# Patient Record
Sex: Male | Born: 1945
Health system: Southern US, Community
[De-identification: ages and names within clinical notes are randomized; demographics above are authoritative.]

## PROBLEM LIST (undated history)

## (undated) ENCOUNTER — Emergency Department (HOSPITAL_COMMUNITY): Admission: EM | Payer: Medicare HMO | Source: Home / Self Care

## (undated) DIAGNOSIS — J449 Chronic obstructive pulmonary disease, unspecified: Secondary | ICD-10-CM

## (undated) DIAGNOSIS — C61 Malignant neoplasm of prostate: Secondary | ICD-10-CM

## (undated) HISTORY — PX: TONSILLECTOMY: SUR1361

---

## 2006-03-29 ENCOUNTER — Emergency Department (HOSPITAL_COMMUNITY): Admission: EM | Admit: 2006-03-29 | Discharge: 2006-03-29 | Payer: Self-pay | Admitting: Emergency Medicine

## 2009-12-28 HISTORY — PX: RADIOACTIVE SEED IMPLANT: SHX5150

## 2010-02-26 ENCOUNTER — Ambulatory Visit: Admission: RE | Admit: 2010-02-26 | Discharge: 2010-05-09 | Payer: Self-pay | Admitting: Radiation Oncology

## 2010-03-27 ENCOUNTER — Encounter: Admission: RE | Admit: 2010-03-27 | Discharge: 2010-03-27 | Payer: Self-pay | Admitting: Urology

## 2010-06-04 ENCOUNTER — Ambulatory Visit (HOSPITAL_BASED_OUTPATIENT_CLINIC_OR_DEPARTMENT_OTHER): Admission: RE | Admit: 2010-06-04 | Discharge: 2010-06-04 | Payer: Self-pay | Admitting: Urology

## 2010-06-19 ENCOUNTER — Ambulatory Visit: Admission: RE | Admit: 2010-06-19 | Discharge: 2010-07-11 | Payer: Self-pay | Admitting: Radiation Oncology

## 2011-03-16 LAB — COMPREHENSIVE METABOLIC PANEL
ALT: 37 U/L (ref 0–53)
AST: 28 U/L (ref 0–37)
Albumin: 4.6 g/dL (ref 3.5–5.2)
Alkaline Phosphatase: 56 U/L (ref 39–117)
BUN: 14 mg/dL (ref 6–23)
CO2: 28 mEq/L (ref 19–32)
Calcium: 9.4 mg/dL (ref 8.4–10.5)
Chloride: 103 mEq/L (ref 96–112)
Creatinine, Ser: 1.07 mg/dL (ref 0.4–1.5)
GFR calc Af Amer: >60 mL/min (ref >60)
GFR calc non Af Amer: >60 mL/min (ref >60)
Glucose, Bld: 133 mg/dL — ABNORMAL HIGH (ref 70–99)
Potassium: 4.2 mEq/L (ref 3.5–5.1)
Sodium: 138 mEq/L (ref 135–145)
Total Bilirubin: 0.7 mg/dL (ref 0.3–1.2)
Total Protein: 7.3 g/dL (ref 6.0–8.3)

## 2011-03-16 LAB — CBC
HCT: 41.9 % (ref 39.0–52.0)
Hemoglobin: 14.2 g/dL (ref 13.0–17.0)
MCHC: 33.9 g/dL (ref 30.0–36.0)
MCV: 91.1 fL (ref 78.0–100.0)
Platelets: 324 10*3/uL (ref 150–400)
RBC: 4.59 MIL/uL (ref 4.22–5.81)
RDW: 13.9 % (ref 11.5–15.5)
WBC: 6.9 10*3/uL (ref 4.0–10.5)

## 2011-03-16 LAB — PROTIME-INR
INR: 1.02 (ref 0.00–1.49)
Prothrombin Time: 13.3 seconds (ref 11.6–15.2)

## 2011-03-16 LAB — APTT: aPTT: 35 seconds (ref 24–37)

## 2015-08-27 ENCOUNTER — Other Ambulatory Visit (HOSPITAL_COMMUNITY): Payer: Self-pay | Admitting: Urology

## 2015-08-27 DIAGNOSIS — C61 Malignant neoplasm of prostate: Secondary | ICD-10-CM

## 2015-09-11 ENCOUNTER — Encounter (HOSPITAL_COMMUNITY)
Admission: RE | Admit: 2015-09-11 | Discharge: 2015-09-11 | Disposition: A | Discharge status: Home / Self Care | Payer: Medicare Other | Source: Ambulatory Visit | Attending: Urology | Admitting: Urology

## 2015-09-11 DIAGNOSIS — C61 Malignant neoplasm of prostate: Secondary | ICD-10-CM | POA: Insufficient documentation

## 2015-09-11 MED ORDER — TECHNETIUM TC 99M MEDRONATE IV KIT
27.4000 | PACK | Freq: Once | INTRAVENOUS | Status: AC | PRN
  Administered 2015-09-11: 27.4 via INTRAVENOUS

## 2016-02-10 DIAGNOSIS — Z961 Presence of intraocular lens: Secondary | ICD-10-CM | POA: Diagnosis not present

## 2016-02-10 DIAGNOSIS — H40013 Open angle with borderline findings, low risk, bilateral: Secondary | ICD-10-CM | POA: Diagnosis not present

## 2016-02-10 DIAGNOSIS — H10013 Acute follicular conjunctivitis, bilateral: Secondary | ICD-10-CM | POA: Diagnosis not present

## 2016-02-10 DIAGNOSIS — D3131 Benign neoplasm of right choroid: Secondary | ICD-10-CM | POA: Diagnosis not present

## 2016-03-06 DIAGNOSIS — L2084 Intrinsic (allergic) eczema: Secondary | ICD-10-CM | POA: Diagnosis not present

## 2016-03-06 DIAGNOSIS — Z0000 Encounter for general adult medical examination without abnormal findings: Secondary | ICD-10-CM | POA: Diagnosis not present

## 2016-03-06 DIAGNOSIS — J4530 Mild persistent asthma, uncomplicated: Secondary | ICD-10-CM | POA: Diagnosis not present

## 2016-03-06 DIAGNOSIS — C61 Malignant neoplasm of prostate: Secondary | ICD-10-CM | POA: Diagnosis not present

## 2016-03-06 DIAGNOSIS — Z131 Encounter for screening for diabetes mellitus: Secondary | ICD-10-CM | POA: Diagnosis not present

## 2016-03-06 DIAGNOSIS — E7800 Pure hypercholesterolemia, unspecified: Secondary | ICD-10-CM | POA: Diagnosis not present

## 2016-03-09 DIAGNOSIS — R69 Illness, unspecified: Secondary | ICD-10-CM | POA: Diagnosis not present

## 2016-03-20 DIAGNOSIS — C61 Malignant neoplasm of prostate: Secondary | ICD-10-CM | POA: Diagnosis not present

## 2016-03-27 DIAGNOSIS — C61 Malignant neoplasm of prostate: Secondary | ICD-10-CM | POA: Diagnosis not present

## 2016-03-27 DIAGNOSIS — Z0000 Encounter for general adult medical examination without abnormal findings: Secondary | ICD-10-CM | POA: Diagnosis not present

## 2016-06-10 ENCOUNTER — Encounter (HOSPITAL_BASED_OUTPATIENT_CLINIC_OR_DEPARTMENT_OTHER): Payer: Self-pay

## 2016-06-10 ENCOUNTER — Emergency Department (HOSPITAL_BASED_OUTPATIENT_CLINIC_OR_DEPARTMENT_OTHER)
Admission: EM | Admit: 2016-06-10 | Discharge: 2016-06-10 | Disposition: A | Discharge status: Home / Self Care | Payer: Medicare HMO | Attending: Emergency Medicine | Admitting: Emergency Medicine

## 2016-06-10 DIAGNOSIS — L03115 Cellulitis of right lower limb: Secondary | ICD-10-CM | POA: Diagnosis not present

## 2016-06-10 DIAGNOSIS — Z79899 Other long term (current) drug therapy: Secondary | ICD-10-CM | POA: Diagnosis not present

## 2016-06-10 DIAGNOSIS — J449 Chronic obstructive pulmonary disease, unspecified: Secondary | ICD-10-CM | POA: Diagnosis not present

## 2016-06-10 DIAGNOSIS — R2241 Localized swelling, mass and lump, right lower limb: Secondary | ICD-10-CM | POA: Diagnosis present

## 2016-06-10 DIAGNOSIS — R062 Wheezing: Secondary | ICD-10-CM | POA: Diagnosis not present

## 2016-06-10 DIAGNOSIS — Z8546 Personal history of malignant neoplasm of prostate: Secondary | ICD-10-CM | POA: Diagnosis not present

## 2016-06-10 HISTORY — DX: Malignant neoplasm of prostate: C61

## 2016-06-10 HISTORY — DX: Chronic obstructive pulmonary disease, unspecified: J44.9

## 2016-06-10 MED ORDER — CEPHALEXIN 500 MG PO CAPS: 1000.0000 mg | ORAL_CAPSULE | Freq: Two times a day (BID) | ORAL | Status: DC

## 2016-06-10 MED ORDER — SULFAMETHOXAZOLE-TRIMETHOPRIM 800-160 MG PO TABS
1.0000 | ORAL_TABLET | Freq: Once | ORAL | Status: AC
  Administered 2016-06-10: 1 via ORAL
  Filled 2016-06-10: qty 1

## 2016-06-10 MED ORDER — CEPHALEXIN 250 MG PO CAPS
1000.0000 mg | ORAL_CAPSULE | Freq: Once | ORAL | Status: AC
  Administered 2016-06-10: 1000 mg via ORAL
  Filled 2016-06-10: qty 4

## 2016-06-10 MED ORDER — SULFAMETHOXAZOLE-TRIMETHOPRIM 800-160 MG PO TABS: 1.0000 | ORAL_TABLET | Freq: Two times a day (BID) | ORAL | Status: AC

## 2016-06-10 MED FILL — CEPHALEXIN 500 MG CAPSULE: 500 | 7 days supply | Qty: 28 | Fill #0

## 2016-06-10 MED FILL — SULFAMETHOXAZOLE-TMP DS TAB: 800-160 | 7 days supply | Qty: 14 | Fill #0

## 2016-06-10 NOTE — Discharge Instructions (Signed)

## 2016-06-10 NOTE — ED Notes (Signed)
Pt sent from PCP-c/o "lump" to right thigh-first noticed last night and has increased in size-NAD-steady gait

## 2016-06-10 NOTE — ED Provider Notes (Signed)
CSN: [id number]     Arrival date & time 06/10/16  1633 History   First MD Initiated Contact with Patient 06/10/16 1708     Chief Complaint  Patient presents with  . Mass     (Consider location/radiation/quality/duration/timing/severity/associated sxs/prior Treatment) HPI Patient has small lump to the right medial thigh yesterday evening. He reports that it has grown in size significantly over the course of today with increasing redness. He does not know if there was an insect bite. He reports he does walk in the woods. He does however report he checks for ticks and none was present. He reports that his mildly uncomfortable but not severely painful. It does not interfere with his gait. No fevers or chills. No nausea or general illness. Patient was seen by his family doctor today and advised to come to the emergency department for further assessment. Past Medical History  Diagnosis Date  . Prostate cancer ([place])   . COPD (chronic obstructive pulmonary disease) ([place])    History reviewed. No pertinent past surgical history. No family history on file. Social History  Substance Use Topics  . Smoking status: Never Smoker   . Smokeless tobacco: None  . Alcohol Use: Yes     Comment: daily    Review of Systems  10 Systems reviewed and are negative for acute change except as noted in the HPI.   Allergies  Review of patient's allergies indicates no known allergies.  Home Medications   Prior to Admission medications   Medication Sig Start Date End Date Taking? Authorizing Provider  Budesonide-Formoterol Fumarate (SYMBICORT IN) Inhale into the lungs.   Yes Historical Provider, MD  cephALEXin (KEFLEX) 500 MG capsule Take 2 capsules (1,000 mg total) by mouth 2 (two) times daily. 06/10/16   Charlesetta Shanks, MD  sulfamethoxazole-trimethoprim (BACTRIM DS,SEPTRA DS) 800-160 MG tablet Take 1 tablet by mouth 2 (two) times daily. 06/10/16 06/17/16  Charlesetta Shanks, MD   BP 156/88 mmHg  Pulse 92   Temp(Src) 99.2 F (37.3 C) (Oral)  Resp 18  Ht 6' (1.829 m)  Wt 198 lb (89.812 kg)  BMI 26.85 kg/m2  SpO2 96% Physical Exam  Constitutional: He is oriented to person, place, and time. He appears well-developed and well-nourished. No distress.  HENT:  Head: Normocephalic and atraumatic.  Eyes: EOM are normal.  Neck: Neck supple.  Cardiovascular: Normal rate, regular rhythm, normal heart sounds and intact distal pulses.   Pulmonary/Chest: Effort normal. He has wheezes.  Occasional expiratory wheeze but adequate air flow to the bases.  Abdominal: Soft. Bowel sounds are normal. He exhibits no distension. There is no tenderness.  Musculoskeletal: Normal range of motion. He exhibits edema.  Right upper, medial thigh has a plaque of erythema that is 18 x 22 cm with the 22 cm being in the long axis. Erythema is blanching. To the superior aspect of this lesion there is a 9 x 6 area of induration within the erythema. There is no fluctuance present. This is mildly tender. No streaking into the groin. No groin lymphadenopathy. No knee effusion no lower leg edema.  Ultrasound was used to aid in examination. Area of induration showed cellulitis but no focal abscess.  Neurological: He is alert and oriented to person, place, and time. He has normal strength. Coordination normal. GCS eye subscore is 4. GCS verbal subscore is 5. GCS motor subscore is 6.  Skin: Skin is warm, dry and intact.  Psychiatric: He has a normal mood and affect.    ED Course  Procedures (including critical care time) Labs Review Labs Reviewed - No data to display  Imaging Review No results found. I have personally reviewed and evaluated these images and lab results as part of my medical decision-making.   EKG Interpretation None      MDM   Final diagnoses:  Cellulitis of right lower extremity   Patient has no constitutional symptoms. He presents with an area of erythema and central induration. He has mild associated  discomfort but not significant pain. At this time he'll be treated for cellulitis with no focal abscess. Consideration is also for possible insect envenomation with inflammatory response, there is however no conclusive history of insect bite. Other consideration is for thrombophlebitis. There is however minimal tenderness and erythema and induration is in a broader distribution that I would anticipate with thrombophlebitis. There is no focal palpable cord. As I was assessing for abscess with ultrasound, all vessels beneath the area were very compressible. At this time with the patient being well we will initiate outpatient antibiotics with close follow-up. He is counseled on signs and symptoms for which return. Area has been outlined with skin marker.    Charlesetta Shanks, MD 06/10/16 [phone]

## 2016-06-12 DIAGNOSIS — L0291 Cutaneous abscess, unspecified: Secondary | ICD-10-CM | POA: Diagnosis not present

## 2016-06-15 DIAGNOSIS — L02415 Cutaneous abscess of right lower limb: Secondary | ICD-10-CM | POA: Diagnosis not present

## 2016-07-27 DIAGNOSIS — C61 Malignant neoplasm of prostate: Secondary | ICD-10-CM | POA: Diagnosis not present

## 2016-08-03 DIAGNOSIS — C61 Malignant neoplasm of prostate: Secondary | ICD-10-CM | POA: Diagnosis not present

## 2016-08-11 DIAGNOSIS — H10013 Acute follicular conjunctivitis, bilateral: Secondary | ICD-10-CM | POA: Diagnosis not present

## 2016-08-11 DIAGNOSIS — H1013 Acute atopic conjunctivitis, bilateral: Secondary | ICD-10-CM | POA: Diagnosis not present

## 2016-08-11 DIAGNOSIS — H40013 Open angle with borderline findings, low risk, bilateral: Secondary | ICD-10-CM | POA: Diagnosis not present

## 2016-10-28 DIAGNOSIS — R69 Illness, unspecified: Secondary | ICD-10-CM | POA: Diagnosis not present

## 2017-01-27 DIAGNOSIS — C61 Malignant neoplasm of prostate: Secondary | ICD-10-CM | POA: Diagnosis not present

## 2017-02-03 DIAGNOSIS — R972 Elevated prostate specific antigen [PSA]: Secondary | ICD-10-CM | POA: Diagnosis not present

## 2017-02-12 DIAGNOSIS — L2089 Other atopic dermatitis: Secondary | ICD-10-CM | POA: Diagnosis not present

## 2017-02-12 DIAGNOSIS — L918 Other hypertrophic disorders of the skin: Secondary | ICD-10-CM | POA: Diagnosis not present

## 2017-02-22 DIAGNOSIS — H35033 Hypertensive retinopathy, bilateral: Secondary | ICD-10-CM | POA: Diagnosis not present

## 2017-02-22 DIAGNOSIS — H40013 Open angle with borderline findings, low risk, bilateral: Secondary | ICD-10-CM | POA: Diagnosis not present

## 2017-02-22 DIAGNOSIS — H35359 Cystoid macular degeneration, unspecified eye: Secondary | ICD-10-CM | POA: Diagnosis not present

## 2017-02-22 DIAGNOSIS — Z961 Presence of intraocular lens: Secondary | ICD-10-CM | POA: Diagnosis not present

## 2017-03-04 ENCOUNTER — Encounter (INDEPENDENT_AMBULATORY_CARE_PROVIDER_SITE_OTHER): Payer: Medicare HMO | Admitting: Ophthalmology

## 2017-03-04 DIAGNOSIS — D3132 Benign neoplasm of left choroid: Secondary | ICD-10-CM | POA: Diagnosis not present

## 2017-03-04 DIAGNOSIS — H43813 Vitreous degeneration, bilateral: Secondary | ICD-10-CM | POA: Diagnosis not present

## 2017-03-04 DIAGNOSIS — H59032 Cystoid macular edema following cataract surgery, left eye: Secondary | ICD-10-CM

## 2017-03-04 DIAGNOSIS — H35372 Puckering of macula, left eye: Secondary | ICD-10-CM

## 2017-03-09 DIAGNOSIS — Z131 Encounter for screening for diabetes mellitus: Secondary | ICD-10-CM | POA: Diagnosis not present

## 2017-03-09 DIAGNOSIS — R634 Abnormal weight loss: Secondary | ICD-10-CM | POA: Diagnosis not present

## 2017-03-09 DIAGNOSIS — Z0000 Encounter for general adult medical examination without abnormal findings: Secondary | ICD-10-CM | POA: Diagnosis not present

## 2017-03-09 DIAGNOSIS — E7800 Pure hypercholesterolemia, unspecified: Secondary | ICD-10-CM | POA: Diagnosis not present

## 2017-03-16 DIAGNOSIS — R69 Illness, unspecified: Secondary | ICD-10-CM | POA: Diagnosis not present

## 2017-04-15 ENCOUNTER — Encounter (INDEPENDENT_AMBULATORY_CARE_PROVIDER_SITE_OTHER): Payer: Medicare HMO | Admitting: Ophthalmology

## 2017-04-15 DIAGNOSIS — D3132 Benign neoplasm of left choroid: Secondary | ICD-10-CM

## 2017-04-15 DIAGNOSIS — H59032 Cystoid macular edema following cataract surgery, left eye: Secondary | ICD-10-CM | POA: Diagnosis not present

## 2017-04-15 DIAGNOSIS — H43813 Vitreous degeneration, bilateral: Secondary | ICD-10-CM | POA: Diagnosis not present

## 2017-06-15 ENCOUNTER — Encounter (INDEPENDENT_AMBULATORY_CARE_PROVIDER_SITE_OTHER): Payer: Medicare HMO | Admitting: Ophthalmology

## 2017-06-15 DIAGNOSIS — D3132 Benign neoplasm of left choroid: Secondary | ICD-10-CM | POA: Diagnosis not present

## 2017-06-15 DIAGNOSIS — H43813 Vitreous degeneration, bilateral: Secondary | ICD-10-CM | POA: Diagnosis not present

## 2017-06-15 DIAGNOSIS — H59032 Cystoid macular edema following cataract surgery, left eye: Secondary | ICD-10-CM

## 2017-08-04 DIAGNOSIS — C61 Malignant neoplasm of prostate: Secondary | ICD-10-CM | POA: Diagnosis not present

## 2017-08-25 DIAGNOSIS — C61 Malignant neoplasm of prostate: Secondary | ICD-10-CM | POA: Diagnosis not present

## 2017-08-26 ENCOUNTER — Other Ambulatory Visit (HOSPITAL_COMMUNITY): Payer: Self-pay | Admitting: Urology

## 2017-08-26 DIAGNOSIS — C61 Malignant neoplasm of prostate: Secondary | ICD-10-CM

## 2017-09-07 ENCOUNTER — Encounter (HOSPITAL_COMMUNITY)
Admission: RE | Admit: 2017-09-07 | Discharge: 2017-09-07 | Disposition: A | Discharge status: Home / Self Care | Payer: Medicare HMO | Source: Ambulatory Visit | Attending: Urology | Admitting: Urology

## 2017-09-07 DIAGNOSIS — C61 Malignant neoplasm of prostate: Secondary | ICD-10-CM | POA: Insufficient documentation

## 2017-09-07 MED ORDER — TECHNETIUM TC 99M MEDRONATE IV KIT
20.2000 | PACK | Freq: Once | INTRAVENOUS | Status: AC | PRN
  Administered 2017-09-07: 20.2 via INTRAVENOUS

## 2017-09-15 ENCOUNTER — Encounter (INDEPENDENT_AMBULATORY_CARE_PROVIDER_SITE_OTHER): Payer: Medicare HMO | Admitting: Ophthalmology

## 2017-09-15 DIAGNOSIS — H43813 Vitreous degeneration, bilateral: Secondary | ICD-10-CM

## 2017-09-15 DIAGNOSIS — D3132 Benign neoplasm of left choroid: Secondary | ICD-10-CM | POA: Diagnosis not present

## 2017-09-15 DIAGNOSIS — H59032 Cystoid macular edema following cataract surgery, left eye: Secondary | ICD-10-CM | POA: Diagnosis not present

## 2017-09-24 DIAGNOSIS — L400 Psoriasis vulgaris: Secondary | ICD-10-CM | POA: Diagnosis not present

## 2017-09-28 DIAGNOSIS — R69 Illness, unspecified: Secondary | ICD-10-CM | POA: Diagnosis not present

## 2017-10-28 DIAGNOSIS — R69 Illness, unspecified: Secondary | ICD-10-CM | POA: Diagnosis not present

## 2018-02-09 DIAGNOSIS — R69 Illness, unspecified: Secondary | ICD-10-CM | POA: Diagnosis not present

## 2018-03-16 DIAGNOSIS — R69 Illness, unspecified: Secondary | ICD-10-CM | POA: Diagnosis not present

## 2018-03-31 DIAGNOSIS — Z131 Encounter for screening for diabetes mellitus: Secondary | ICD-10-CM | POA: Diagnosis not present

## 2018-03-31 DIAGNOSIS — C61 Malignant neoplasm of prostate: Secondary | ICD-10-CM | POA: Diagnosis not present

## 2018-03-31 DIAGNOSIS — J4530 Mild persistent asthma, uncomplicated: Secondary | ICD-10-CM | POA: Diagnosis not present

## 2018-03-31 DIAGNOSIS — Z1389 Encounter for screening for other disorder: Secondary | ICD-10-CM | POA: Diagnosis not present

## 2018-03-31 DIAGNOSIS — S81812A Laceration without foreign body, left lower leg, initial encounter: Secondary | ICD-10-CM | POA: Diagnosis not present

## 2018-03-31 DIAGNOSIS — J309 Allergic rhinitis, unspecified: Secondary | ICD-10-CM | POA: Diagnosis not present

## 2018-03-31 DIAGNOSIS — Z0000 Encounter for general adult medical examination without abnormal findings: Secondary | ICD-10-CM | POA: Diagnosis not present

## 2018-03-31 DIAGNOSIS — E7800 Pure hypercholesterolemia, unspecified: Secondary | ICD-10-CM | POA: Diagnosis not present

## 2018-05-10 DIAGNOSIS — R69 Illness, unspecified: Secondary | ICD-10-CM | POA: Diagnosis not present

## 2018-08-11 DIAGNOSIS — R69 Illness, unspecified: Secondary | ICD-10-CM | POA: Diagnosis not present

## 2018-09-15 ENCOUNTER — Encounter (INDEPENDENT_AMBULATORY_CARE_PROVIDER_SITE_OTHER): Payer: Medicare HMO | Admitting: Ophthalmology

## 2018-09-15 DIAGNOSIS — D3132 Benign neoplasm of left choroid: Secondary | ICD-10-CM | POA: Diagnosis not present

## 2018-09-15 DIAGNOSIS — H43813 Vitreous degeneration, bilateral: Secondary | ICD-10-CM

## 2018-09-25 DIAGNOSIS — R69 Illness, unspecified: Secondary | ICD-10-CM | POA: Diagnosis not present

## 2018-09-27 DIAGNOSIS — L2089 Other atopic dermatitis: Secondary | ICD-10-CM | POA: Diagnosis not present

## 2018-09-27 DIAGNOSIS — L821 Other seborrheic keratosis: Secondary | ICD-10-CM | POA: Diagnosis not present

## 2018-09-30 ENCOUNTER — Other Ambulatory Visit: Payer: Self-pay | Admitting: Urology

## 2018-09-30 DIAGNOSIS — C61 Malignant neoplasm of prostate: Secondary | ICD-10-CM

## 2018-10-11 DIAGNOSIS — C61 Malignant neoplasm of prostate: Secondary | ICD-10-CM | POA: Diagnosis not present

## 2018-10-12 DIAGNOSIS — C61 Malignant neoplasm of prostate: Secondary | ICD-10-CM | POA: Diagnosis not present

## 2018-10-12 DIAGNOSIS — R9721 Rising PSA following treatment for malignant neoplasm of prostate: Secondary | ICD-10-CM | POA: Diagnosis not present

## 2018-10-12 DIAGNOSIS — R3121 Asymptomatic microscopic hematuria: Secondary | ICD-10-CM | POA: Diagnosis not present

## 2018-10-17 ENCOUNTER — Encounter (HOSPITAL_COMMUNITY)
Admission: RE | Admit: 2018-10-17 | Discharge: 2018-10-17 | Disposition: A | Discharge status: Home / Self Care | Payer: Medicare HMO | Source: Ambulatory Visit | Attending: Urology | Admitting: Urology

## 2018-10-17 ENCOUNTER — Ambulatory Visit (HOSPITAL_COMMUNITY)
Admission: RE | Admit: 2018-10-17 | Discharge: 2018-10-17 | Disposition: A | Discharge status: Home / Self Care | Payer: Medicare HMO | Source: Ambulatory Visit | Attending: Urology | Admitting: Urology

## 2018-10-17 DIAGNOSIS — C61 Malignant neoplasm of prostate: Secondary | ICD-10-CM | POA: Insufficient documentation

## 2018-10-17 MED ORDER — FLUDEOXYGLUCOSE F - 18 (FDG) INJECTION
20.8000 | Freq: Once | INTRAVENOUS | Status: AC | PRN
  Administered 2018-10-17: 20.8 via INTRAVENOUS

## 2018-10-26 ENCOUNTER — Encounter (HOSPITAL_COMMUNITY): Payer: Medicare HMO

## 2018-10-26 ENCOUNTER — Other Ambulatory Visit (HOSPITAL_COMMUNITY): Payer: Medicare HMO

## 2018-11-09 DIAGNOSIS — C61 Malignant neoplasm of prostate: Secondary | ICD-10-CM | POA: Diagnosis not present

## 2018-11-09 DIAGNOSIS — R3121 Asymptomatic microscopic hematuria: Secondary | ICD-10-CM | POA: Diagnosis not present

## 2018-11-09 DIAGNOSIS — R972 Elevated prostate specific antigen [PSA]: Secondary | ICD-10-CM | POA: Diagnosis not present

## 2018-11-14 DIAGNOSIS — R69 Illness, unspecified: Secondary | ICD-10-CM | POA: Diagnosis not present

## 2018-11-21 DIAGNOSIS — C61 Malignant neoplasm of prostate: Secondary | ICD-10-CM | POA: Diagnosis not present

## 2018-11-21 DIAGNOSIS — R3121 Asymptomatic microscopic hematuria: Secondary | ICD-10-CM | POA: Diagnosis not present

## 2018-11-28 ENCOUNTER — Other Ambulatory Visit: Payer: Self-pay | Admitting: Urology

## 2018-12-09 NOTE — Patient Instructions (Addendum)
Jose Wolfe  12/09/2018   Your procedure is scheduled on: 12-15-18    Report to [facility] Main  Entrance    Report to Admitting at 5:30  AM    Call this number if you have problems the morning of surgery [phone]    Remember: Do not eat food or drink liquids :After Midnight.     BRUSH YOUR TEETH MORNING OF SURGERY AND RINSE YOUR MOUTH OUT, NO CHEWING GUM CANDY OR MINTS.     Take these medicines the morning of surgery with A SIP OF WATER: None. You may use and bring your inhaler .                                You may not have any metal on your body including hair pins and              piercings  Do not wear jewelry, lotions, powders, cologne or deodorant             Men may shave face and neck.   Do not bring valuables to the hospital. [name]  Contacts, dentures or bridgework may not be worn into surgery.     Patients discharged the day of surgery will not be allowed to drive home.  Name and phone number of your driver: Jose Wolfe [phone]  Special Instructions: Please bring a copy of your Advance Directive and/or Living Will              Please read over the following fact sheets you were given: _____________________________________________________________________             [name] - Preparing for Surgery Before surgery, you can play an important role.  Because skin is not sterile, your skin needs to be as free of germs as possible.  You can reduce the number of germs on your skin by washing with CHG (chlorahexidine gluconate) soap before surgery.  CHG is an antiseptic cleaner which kills germs and bonds with the skin to continue killing germs even after washing. Please DO NOT use if you have an allergy to CHG or antibacterial soaps.  If your skin becomes reddened/irritated stop using the CHG and inform your nurse when you arrive at Short Stay. Do not shave  (including legs and underarms) for at least 48 hours prior to the first CHG shower.  You may shave your face/neck. Please follow these instructions carefully:  1.  Shower with CHG Soap the night before surgery and the  morning of Surgery.  2.  If you choose to wash your hair, wash your hair first as usual with your  normal  shampoo.  3.  After you shampoo, rinse your hair and body thoroughly to remove the  shampoo.                           4.  Use CHG as you would any other liquid soap.  You can apply chg directly  to the skin and wash                       Gently with a scrungie or clean washcloth.  5.  Apply the CHG Soap to your body ONLY FROM THE NECK DOWN.   Do not use on face/ open                           Wound or open sores. Avoid contact with eyes, ears mouth and genitals (private parts).                       Wash face,  Genitals (private parts) with your normal soap.             6.  Wash thoroughly, paying special attention to the area where your surgery  will be performed.  7.  Thoroughly rinse your body with warm water from the neck down.  8.  DO NOT shower/wash with your normal soap after using and rinsing off  the CHG Soap.                9.  Pat yourself dry with a clean towel.            10.  Wear clean pajamas.            11.  Place clean sheets on your bed the night of your first shower and do not  sleep with pets. Day of Surgery : Do not apply any lotions/deodorants the morning of surgery.  Please wear clean clothes to the hospital/surgery center.  FAILURE TO FOLLOW THESE INSTRUCTIONS MAY RESULT IN THE CANCELLATION OF YOUR SURGERY PATIENT SIGNATURE_________________________________  NURSE SIGNATURE__________________________________  ________________________________________________________________________

## 2018-12-12 ENCOUNTER — Encounter (HOSPITAL_COMMUNITY): Payer: Self-pay

## 2018-12-12 ENCOUNTER — Other Ambulatory Visit: Payer: Self-pay

## 2018-12-12 ENCOUNTER — Encounter (HOSPITAL_COMMUNITY)
Admission: RE | Admit: 2018-12-12 | Discharge: 2018-12-12 | Disposition: A | Discharge status: Home / Self Care | Payer: Medicare HMO | Source: Ambulatory Visit | Attending: Urology | Admitting: Urology

## 2018-12-12 DIAGNOSIS — C675 Malignant neoplasm of bladder neck: Secondary | ICD-10-CM | POA: Diagnosis not present

## 2018-12-12 DIAGNOSIS — D494 Neoplasm of unspecified behavior of bladder: Secondary | ICD-10-CM | POA: Diagnosis present

## 2018-12-12 DIAGNOSIS — Z01812 Encounter for preprocedural laboratory examination: Secondary | ICD-10-CM | POA: Insufficient documentation

## 2018-12-12 DIAGNOSIS — Z7951 Long term (current) use of inhaled steroids: Secondary | ICD-10-CM | POA: Diagnosis not present

## 2018-12-12 DIAGNOSIS — Z923 Personal history of irradiation: Secondary | ICD-10-CM | POA: Diagnosis not present

## 2018-12-12 DIAGNOSIS — Z8546 Personal history of malignant neoplasm of prostate: Secondary | ICD-10-CM

## 2018-12-12 DIAGNOSIS — J449 Chronic obstructive pulmonary disease, unspecified: Secondary | ICD-10-CM | POA: Diagnosis not present

## 2018-12-12 LAB — CBC
HCT: 42.1 % (ref 39.0–52.0)
Hemoglobin: 13.3 g/dL (ref 13.0–17.0)
MCH: 30.1 pg (ref 26.0–34.0)
MCHC: 31.6 g/dL (ref 30.0–36.0)
MCV: 95.2 fL (ref 80.0–100.0)
Platelets: 289 10*3/uL (ref 150–400)
RBC: 4.42 MIL/uL (ref 4.22–5.81)
RDW: 13.2 % (ref 11.5–15.5)
WBC: 7.3 10*3/uL (ref 4.0–10.5)
nRBC: 0 % (ref 0.0–0.2)

## 2018-12-12 LAB — BASIC METABOLIC PANEL
Anion gap: 8 (ref 5–15)
BUN: 18 mg/dL (ref 8–23)
CO2: 29 mmol/L (ref 22–32)
Calcium: 8.8 mg/dL — ABNORMAL LOW (ref 8.9–10.3)
Chloride: 105 mmol/L (ref 98–111)
Creatinine, Ser: 1.01 mg/dL (ref 0.61–1.24)
GFR calc Af Amer: >60 mL/min (ref >60)
GFR calc non Af Amer: >60 mL/min (ref >60)
Glucose, Bld: 83 mg/dL (ref 70–99)
Potassium: 3.9 mmol/L (ref 3.5–5.1)
Sodium: 142 mmol/L (ref 135–145)

## 2018-12-14 NOTE — Anesthesia Preprocedure Evaluation (Addendum)
Anesthesia Evaluation  Patient identified by MRN, date of birth, ID band Patient awake    Reviewed: Allergy & Precautions, NPO status , Patient's Chart, lab work & pertinent test results  History of Anesthesia Complications Negative for: history of anesthetic complications  Airway Mallampati: II  TM Distance: >3 FB Neck ROM: Full    Dental  (+) Dental Advisory Given, Partial Upper   Pulmonary COPD,  COPD inhaler,    breath sounds clear to auscultation       Cardiovascular negative cardio ROS   Rhythm:Regular Rate:Normal     Neuro/Psych negative neurological ROS  negative psych ROS   GI/Hepatic negative GI ROS, Neg liver ROS,   Endo/Other  negative endocrine ROS  Renal/GU negative Renal ROS    Prostate cancer Bladder tumor     Musculoskeletal negative musculoskeletal ROS (+)   Abdominal   Peds  Hematology negative hematology ROS (+)   Anesthesia Other Findings   Reproductive/Obstetrics                            Anesthesia Physical Anesthesia Plan  ASA: II  Anesthesia Plan: General   Post-op Pain Management:    Induction: Intravenous  PONV Risk Score and Plan: 3 and Treatment may vary due to age or medical condition, Ondansetron and Dexamethasone  Airway Management Planned: LMA  Additional Equipment: None  Intra-op Plan:   Post-operative Plan: Extubation in OR  Informed Consent: I have reviewed the patients History and Physical, chart, labs and discussed the procedure including the risks, benefits and alternatives for the proposed anesthesia with the patient or authorized representative who has indicated his/her understanding and acceptance.   Dental advisory given  Plan Discussed with: CRNA and Anesthesiologist  Anesthesia Plan Comments:        Anesthesia Quick Evaluation

## 2018-12-14 NOTE — H&P (Signed)
CC/HPI: I have prostate cancer.     Jose Wolfe returns today in f/u for cystoscopy for evaluation of the lesion on CT at the bladder neck.Marland Kitchen   His PSA was 4.63 and his testosterone was 234. His last PSA on study had gotten up to 12 off of the Relugolix. The bonescan was negative and the CT showed small stable retroperitoneal nodes and a new 97mm node adjacent to the bladder on the left. There were some areas of increased density at the base of the bladder of uncertain etiology. He has no associated signs or symptoms.   He had a seed implant for prostate cancer in 2011. He developed PSA recurrent disease with a negative prostate biopsy in 2013 and staging studies have remained negative. He was placed on the HERO trial last year for a PSA of 20. His PSA nadired on trial at 1.49. He is now off of the study and the PSA is up to 12.63. His PSA on the last day he took a pill was 6.63 with a T of <10.    ALLERGIES: PredniSONE TABS    MEDICATIONS: Betamethasone Dipropionate 0.05 % ointment  Symbicort 160-4.5 MCG/ACT Inhalation Aerosol 0 Inhalation  Triamcinolone Acetonide 0.1 % cream External     GU PSH: Locm 300-399Mg /Ml Iodine,1Ml - 10/17/2018 TRANSPERI NEEDLE PLACE, PROS - 2011      PSH Notes: Surgery Prostate Transperineal Placement Of Needles, Colonoscopy (Fiberoptic), Tonsillectomy   NON-GU PSH: Diagnostic Colonoscopy - 2011 Drain Skin Abscess, Right, thigh - 2017 Remove Tonsils - 2008    GU PMH: Metastatic pelvic lymphadenopathy, 58mm node on the left adjacent to the bladder. - 11/09/2018 Microscopic hematuria, He has microhematuria that was actually present in 8/18. I will consider cystoscopy at f/u since that hasn't been done. - 10/12/2018 Prostate Cancer ([place]ors[place]), He is off of the study drug and his PSA is back up to 12.63 on 9/30 but on his labs in late August the PSA was already up to 6.63 with a testosterone of <10 on treatment. I was never able to find a testosterone with his 9/30  labs and will a repeat T and PSA today. He will proceed with the staging w/u next week with CT and bonescan and will f/u in 3 weeks. I discussed orchiectomy, firmagon and lupron as next options for ADT. - 10/12/2018, Prostate cancer, - 2017 Rising PSA after prostate cancer treatment ([place]ors[place]) - 10/12/2018 Elevated PSA, Elevated prostate specific antigen (PSA) - 2014 Other microscopic hematuria, Microscopic hematuria - 2014 Prostate nodule w/o LUTS, Nodular prostate without lower urinary tract symptoms - 2014    NON-GU PMH: Encounter for general adult medical examination without abnormal findings, Encounter for preventive health examination - 2017 Asthma, Asthma - 2014 Personal history of other infectious and parasitic diseases, History of hepatitis - 2014    FAMILY HISTORY: Family Health Status Number - No Family History renal failure - Father   SOCIAL HISTORY: Marital Status: Married Preferred Language: English; Race: White Current Smoking Status: Patient has never smoked.  Drinks 1 drink per day. Types of alcohol consumed: Wine. Light Drinker.  Drinks 4+ caffeinated drinks per day. Patient's occupation is/was Retired.     Notes: Alcohol Use, Death In The Family Mother, Occupation:, Caffeine Use, Marital History - Currently Married, Death In The Family Father, Never Smoked   REVIEW OF SYSTEMS:    GU Review Male:   Patient reports frequent urination. Patient denies hard to postpone urination, burning/ pain with urination, get up at night to urinate,  leakage of urine, stream starts and stops, trouble starting your stream, have to strain to urinate , erection problems, and penile pain.  Gastrointestinal (Upper):   Patient denies nausea, vomiting, and indigestion/ heartburn.  Gastrointestinal (Lower):   Patient denies diarrhea and constipation.  Constitutional:   Patient denies fever, night sweats, weight loss, and fatigue.  Skin:   Patient denies skin rash/ lesion and itching.  Eyes:    Patient denies blurred vision and double vision.  Ears/ Nose/ Throat:   Patient denies sore throat and sinus problems.  Hematologic/Lymphatic:   Patient denies swollen glands and easy bruising.  Cardiovascular:   Patient denies leg swelling and chest pains.  Respiratory:   Patient denies cough and shortness of breath.  Endocrine:   Patient denies excessive thirst.  Musculoskeletal:   Patient denies back pain and joint pain.  Neurological:   Patient denies headaches and dizziness.  Psychologic:   Patient denies depression and anxiety.   VITAL SIGNS: None   MULTI-SYSTEM PHYSICAL EXAMINATION:    Constitutional: Well-nourished. No physical deformities. Normally developed. Good grooming.  Neck: Neck symmetrical, not swollen. Normal tracheal position.  Respiratory: Normal breath sounds. No labored breathing, no use of accessory muscles.   Cardiovascular: Regular rate and rhythm. No murmur, no gallop.   Lymphatic: No enlargement, no tenderness of axillae,supraclavicular or neck lymph nodes.  Skin: No paleness, no jaundice, no cyanosis. No lesion, no ulcer, no rash.  Neurologic / Psychiatric: Oriented to time, oriented to place, oriented to person. No depression, no anxiety, no agitation.     PAST DATA REVIEWED:  Source Of History:  Patient  Urine Test Review:   Urinalysis   10/12/18 08/04/17 01/27/17 07/27/16 03/21/16 08/09/15 02/13/15 07/04/14  PSA  Total PSA 4.63 ng/mL 20.00 ng/mL 16.00 ng/dl 11.80  13.06  9.70  6.50  3.90     10/12/18  Hormones  Testosterone, Total 234.6 ng/dL    PROCEDURES:         Flexible Cystoscopy - 52000  Risks, benefits, and some of the potential complications of the procedure were discussed. 59ml of 2% lidocaine jelly was instilled intraurethrally. Cipro 500mg  given for antibiotic prophylaxis.       Meatus:  Normal size. Normal location. Normal condition.  Urethra:  No strictures.  External Sphincter:  Normal.  Verumontanum:  Normal.  Prostate:   Borderline obstructing. Mild hyperplasia. with blanching from radiation therapy. There is some nodular neoplastic tissue at the bladder neck that is concerning for either recurrent prostate cancer or a secondary neoplasm.   Bladder Neck:  Non-obstructing.  Ureteral Orifices:  Normal location. Normal size. Normal shape. Effluxed clear urine.  Bladder:  Mild trabeculation. No tumors. Normal mucosa except as noted at the bladder neck. . No stones.      The procedure was well tolerated and there were no complications.         Urinalysis w/Scope Dipstick Dipstick Cont'd Micro  Color: Yellow Bilirubin: Neg mg/dL WBC/hpf: NS (Not Seen)  Appearance: Clear Ketones: Neg mg/dL RBC/hpf: 3 - 10/hpf  Specific Gravity: 1.020 Blood: Trace ery/uL Bacteria: NS (Not Seen)  pH: 5.5 Protein: Neg mg/dL Cystals: NS (Not Seen)  Glucose: Neg mg/dL Urobilinogen: 0.2 mg/dL Casts: NS (Not Seen)    Nitrites: Neg Trichomonas: Not Present    Leukocyte Esterase: Neg leu/uL Mucous: Not Present      Epithelial Cells: NS (Not Seen)      Yeast: NS (Not Seen)      Sperm: Not Present  ASSESSMENT:      ICD-10 Details  1 GU:   Prostate Cancer - C61 Worsening - the CT findings were noted on cystoscopy to be neoplastic growth at the bladder neck that could be prostatic carcinoma or less likely a urothelial carcinoma or angiosarcoma. I am going to set him up for a TURBT to clarify the nature of the lesion and if it is prostate cancer, I will resume ADT. I reviewd the risks of a TURBT including bleeding, infection, incontinence, stricture, need for secondary procedures, thrombotic events, fluid overload and anesthetic complications.   2   Microscopic hematuria - R31.21    PLAN:           Schedule Return Visit/Planned Activity: Next Available Appointment - Schedule Surgery             Note: in 1-2 weeks.           Document Letter(s):  Created for Patient: Clinical Summary

## 2018-12-15 ENCOUNTER — Encounter (HOSPITAL_COMMUNITY)
Admission: RE | Disposition: A | Discharge status: Home / Self Care | Payer: Self-pay | Source: Ambulatory Visit | Attending: Urology

## 2018-12-15 ENCOUNTER — Ambulatory Visit (HOSPITAL_COMMUNITY): Payer: Medicare HMO | Admitting: Anesthesiology

## 2018-12-15 ENCOUNTER — Ambulatory Visit (HOSPITAL_COMMUNITY)
Admission: RE | Admit: 2018-12-15 | Discharge: 2018-12-15 | Disposition: A | Discharge status: Home / Self Care | Payer: Medicare HMO | Source: Ambulatory Visit | Attending: Urology | Admitting: Urology

## 2018-12-15 ENCOUNTER — Encounter (HOSPITAL_COMMUNITY): Payer: Self-pay

## 2018-12-15 DIAGNOSIS — Z8546 Personal history of malignant neoplasm of prostate: Secondary | ICD-10-CM | POA: Diagnosis not present

## 2018-12-15 DIAGNOSIS — C675 Malignant neoplasm of bladder neck: Secondary | ICD-10-CM | POA: Insufficient documentation

## 2018-12-15 DIAGNOSIS — J449 Chronic obstructive pulmonary disease, unspecified: Secondary | ICD-10-CM | POA: Diagnosis not present

## 2018-12-15 DIAGNOSIS — D494 Neoplasm of unspecified behavior of bladder: Secondary | ICD-10-CM | POA: Diagnosis not present

## 2018-12-15 DIAGNOSIS — Z7951 Long term (current) use of inhaled steroids: Secondary | ICD-10-CM | POA: Diagnosis not present

## 2018-12-15 DIAGNOSIS — Z923 Personal history of irradiation: Secondary | ICD-10-CM | POA: Diagnosis not present

## 2018-12-15 HISTORY — PX: TRANSURETHRAL RESECTION OF BLADDER TUMOR: SHX2575

## 2018-12-15 SURGERY — TURBT (TRANSURETHRAL RESECTION OF BLADDER TUMOR)
Anesthesia: General

## 2018-12-15 MED ORDER — FENTANYL CITRATE (PF) 100 MCG/2ML IJ SOLN
INTRAMUSCULAR | Status: AC
  Filled 2018-12-15: qty 2

## 2018-12-15 MED ORDER — DEXAMETHASONE SODIUM PHOSPHATE 10 MG/ML IJ SOLN
INTRAMUSCULAR | Status: DC | PRN
  Administered 2018-12-15: 10 mg via INTRAVENOUS

## 2018-12-15 MED ORDER — LACTATED RINGERS IV SOLN
INTRAVENOUS | Status: DC
  Administered 2018-12-15: 1000 mL via INTRAVENOUS

## 2018-12-15 MED ORDER — HYDROCODONE-ACETAMINOPHEN 5-325 MG PO TABS: 1.0000 | ORAL_TABLET | Freq: Four times a day (QID) | ORAL | 0 refills | Status: DC | PRN

## 2018-12-15 MED ORDER — ONDANSETRON HCL 4 MG/2ML IJ SOLN
INTRAMUSCULAR | Status: DC | PRN
  Administered 2018-12-15: 4 mg via INTRAVENOUS

## 2018-12-15 MED ORDER — PROPOFOL 10 MG/ML IV BOLUS
INTRAVENOUS | Status: AC
  Filled 2018-12-15: qty 20

## 2018-12-15 MED ORDER — FENTANYL CITRATE (PF) 100 MCG/2ML IJ SOLN
25.0000 ug | INTRAMUSCULAR | Status: DC | PRN
  Administered 2018-12-15 (×3): 50 ug via INTRAVENOUS

## 2018-12-15 MED ORDER — PROPOFOL 10 MG/ML IV BOLUS
INTRAVENOUS | Status: DC | PRN
  Administered 2018-12-15: 200 mg via INTRAVENOUS

## 2018-12-15 MED ORDER — ONDANSETRON HCL 4 MG/2ML IJ SOLN: 4.0000 mg | Freq: Once | INTRAMUSCULAR | Status: DC | PRN

## 2018-12-15 MED ORDER — OXYCODONE HCL 5 MG PO TABS: 5.0000 mg | ORAL_TABLET | Freq: Once | ORAL | Status: DC | PRN

## 2018-12-15 MED ORDER — FENTANYL CITRATE (PF) 100 MCG/2ML IJ SOLN
INTRAMUSCULAR | Status: DC | PRN
  Administered 2018-12-15 (×4): 25 ug via INTRAVENOUS

## 2018-12-15 MED ORDER — DEXAMETHASONE SODIUM PHOSPHATE 10 MG/ML IJ SOLN
INTRAMUSCULAR | Status: AC
  Filled 2018-12-15: qty 1

## 2018-12-15 MED ORDER — SODIUM CHLORIDE 0.9 % IR SOLN
Status: DC | PRN
  Administered 2018-12-15: 6000 mL

## 2018-12-15 MED ORDER — LIDOCAINE 2% (20 MG/ML) 5 ML SYRINGE
INTRAMUSCULAR | Status: AC
  Filled 2018-12-15: qty 5

## 2018-12-15 MED ORDER — ONDANSETRON HCL 4 MG/2ML IJ SOLN
INTRAMUSCULAR | Status: AC
  Filled 2018-12-15: qty 2

## 2018-12-15 MED ORDER — OXYCODONE HCL 5 MG/5ML PO SOLN
5.0000 mg | Freq: Once | ORAL | Status: DC | PRN
  Filled 2018-12-15: qty 5

## 2018-12-15 MED ORDER — LIDOCAINE 2% (20 MG/ML) 5 ML SYRINGE
INTRAMUSCULAR | Status: DC | PRN
  Administered 2018-12-15: 60 mg via INTRAVENOUS

## 2018-12-15 MED ORDER — CEFAZOLIN SODIUM-DEXTROSE 2-4 GM/100ML-% IV SOLN
2.0000 g | INTRAVENOUS | Status: AC
  Administered 2018-12-15: 2 g via INTRAVENOUS
  Filled 2018-12-15: qty 100

## 2018-12-15 SURGICAL SUPPLY — 17 items
BAG URINE DRAINAGE (UROLOGICAL SUPPLIES) IMPLANT
BAG URO CATCHER STRL LF (MISCELLANEOUS) ×2 IMPLANT
CATH FOLEY 3WAY 30CC 22FR (CATHETERS) ×2 IMPLANT
CATH URET 5FR 28IN OPEN ENDED (CATHETERS) IMPLANT
COVER WAND RF STERILE (DRAPES) IMPLANT
GLOVE SURG SS PI 8.0 STRL IVOR (GLOVE) ×10 IMPLANT
GOWN STRL REUS W/TWL XL LVL3 (GOWN DISPOSABLE) ×6 IMPLANT
HOLDER FOLEY CATH W/STRAP (MISCELLANEOUS) IMPLANT
LOOP CUT BIPOLAR 24F LRG (ELECTROSURGICAL) ×2 IMPLANT
MANIFOLD NEPTUNE II (INSTRUMENTS) ×2 IMPLANT
PACK CYSTO (CUSTOM PROCEDURE TRAY) ×2 IMPLANT
SET ASPIRATION TUBING (TUBING) IMPLANT
SUT ETHILON 3 0 PS 1 (SUTURE) IMPLANT
SYR 30ML LL (SYRINGE) ×2 IMPLANT
SYRINGE IRR TOOMEY STRL 70CC (SYRINGE) IMPLANT
TUBING CONNECTING 10 (TUBING) ×2 IMPLANT
TUBING UROLOGY SET (TUBING) ×2 IMPLANT

## 2018-12-15 NOTE — Discharge Instructions (Addendum)
Transurethral Resection of the Prostate, Care After °Refer to this sheet in the next few weeks. These instructions provide you with information about caring for yourself after your procedure. Your health care provider may also give you more specific instructions. Your treatment has been planned according to current medical practices, but problems sometimes occur. Call your health care provider if you have any problems or questions after your procedure. °What can I expect after the procedure? °After the procedure, it is common to have: °· Mild pain in your lower abdomen. °· Soreness or mild discomfort in your penis from having the catheter inserted during the procedure. °· A feeling of urgency when you need to urinate. °· A small amount of blood in your urine. You may notice some small blood clots in your urine. These are normal. °Follow these instructions at home: °Medicines ° °· Take over-the-counter and prescription medicines only as told by your health care provider. °· Do not drive or operate heavy machinery while taking prescription pain medicine. °· Do not drive for 24 hours if you received a sedative. °· If you were prescribed antibiotic medicine, take it as told by your health care provider. Do not stop taking the antibiotic even if you start to feel better. °Activity °· Return to your normal activities as told by your health care provider. Ask your health care provider what activities are safe for you. °· Do not lift anything that is heavier than 10 lb (4.5 kg) for 3 weeks after your procedure, or as long as told by your health care provider. °· Avoid intense physical activity for as long as told by your health care provider. °· Avoid sitting for a long time without moving. Get up and move around one or more times every few hours. This helps to prevent blood clots. You may increase your physical activity gradually as you start to feel better. °Lifestyle °· Do not drink alcohol for as long as told by your  health care provider. This is especially important if you are taking prescription pain medicines. °· Do not engage in sexual activity until your health care provider says that you can do this. °General instructions °· Do not take baths, swim, or use a hot tub until your health care provider approves. °· Drink enough fluid to keep your urine clear or pale yellow. °· Urinate as soon as you feel the need to. Do not try to hold your urine for long periods of time. °· If your health care provider approves, you may take a stool softener for 2-3 weeks to prevent you from straining to have a bowel movement. °· Wear compression stockings as told by your health care provider. These stockings help to prevent blood clots and reduce swelling in your legs. °· Keep all follow-up visits as told by your health care provider. This is important. °Contact a health care provider if: °· You have difficulty urinating. °· You have a fever. °· You have pain that gets worse or does not improve with medicine. °· You have blood in your urine that does not go away after 1 week of resting and drinking more fluids. °· You have swelling in your penis or testicles. °Get help right away if: °· You are unable to urinate. °· You are having more blood clots in your urine instead of fewer. °· You have: °? Large blood clots. °? A lot of blood in your urine. °? Pain in your back or lower abdomen. °? Pain or swelling in your legs. °?   Chills and you are shaking.  You may remove the catheter in the morning by cutting off the purple nipple.  The catheter should slide out easily.  If you would prefer, you could come to the office to have the catheter removed.  Please call if you need to do that.  This information is not intended to replace advice given to you by your health care provider. Make sure you discuss any questions you have with your health care provider. Document Released: 12/14/2005 Document Revised: 01/27/2017 Document Reviewed:  09/05/2015 Elsevier Interactive Patient Education  2019 [employer].

## 2018-12-15 NOTE — Anesthesia Procedure Notes (Signed)
Procedure Name: LMA Insertion Date/Time: 12/15/2018 7:35 AM Performed by: Audry Pili, MD Patient Re-evaluated:Patient Re-evaluated prior to induction Oxygen Delivery Method: Circle system utilized Preoxygenation: Pre-oxygenation with 100% oxygen Induction Type: IV induction Ventilation: Mask ventilation without difficulty LMA: LMA inserted LMA Size: 4.0 Number of attempts: 1 Placement Confirmation: positive ETCO2 and breath sounds checked- equal and bilateral Tube secured with: Tape Dental Injury: Teeth and Oropharynx as per pre-operative assessment

## 2018-12-15 NOTE — Transfer of Care (Signed)
Immediate Anesthesia Transfer of Care Note  Patient: Jose Wolfe  Procedure(s) Performed: CYSTOSCOPY TRANSURETHRAL RESECTION OF BLADDER TUMOR (TURBT) (N/A )  Patient Location: PACU  Anesthesia Type:General  Level of Consciousness: awake  Airway & Oxygen Therapy: Patient Spontanous Breathing and Patient connected to face mask oxygen  Post-op Assessment: Report given to RN and Post -op Vital signs reviewed and stable  Post vital signs: Reviewed and stable  Last Vitals:  Vitals Value Taken Time  BP    Temp    Pulse 64 12/15/2018  8:31 AM  Resp 13 12/15/2018  8:31 AM  SpO2 100 % 12/15/2018  8:31 AM  Vitals shown include unvalidated device data.  Last Pain:  Vitals:   12/15/18 0611  TempSrc:   PainSc: 0-No pain      Patients Stated Pain Goal: 4 (12/31/02 5913)  Complications: No apparent anesthesia complications

## 2018-12-15 NOTE — Op Note (Signed)
Procedure: Cystoscopy with resection of large bladder neck neoplasm.  Preop diagnosis: History of prostate cancer with bladder neck neoplasm.  Postop diagnosis: Same.  Surgeon: Dr. Irine Seal.  Anesthesia: General.  Specimen: Bladder neck neoplasm chips.  Drain: 17 [place] Foley catheter.  EBL: Minimal.  Complications: None.  Indications: Mr. Olesen is a 72 year old white male with a history of prostate cancer previously treated with radiation therapy.  He had recurrent disease and was on a clinical trial with androgen deprivation therapy until a few months ago.  Once the medication was stopped his PSA had been rising but then declined.  He had microhematuria and cystoscopy revealed a neoplasm bilaterally at the posterior bladder neck that was worrisome for recurrent prostate cancer versus a secondary malignancy and it was felt that biopsy and resection was indicated.  Procedure: He was taken operating room where he is given antibiotics.  A general anesthetic was induced.  He was placed in lithotomy position and fitted with PAS hose.  His perineum and genitalia were prepped with Betadine solution he was draped in usual sterile fashion.  Cystoscopy was performed using a 62 [place] scope with a 30 and 70 degree lenses.  Examination revealed a normal urethra.  The external sphincter was intact.  The prostatic urethra had blanching with some neo vascularity in the mid and apical prostatic urethra.  Proximally at the bladder neck there was circumferential nodular growth consistent with a neoplastic process.  It was most extensive posteriorly.  It appeared most consistent with recurrent prostate cancer but a secondary malignancy is a possibility.  The bladder wall had mild to moderate trabeculation and no other mucosal lesions.  The trigone was obscured by the neoplastic process and the ureteral orifice ease were not clearly seen.  The cystoscope was replaced with a 26 French continuous-flow  resectoscope sheath which was fitted with a Iglesias handle, bipolar loop and a 30 degree lens.  Resection of the left posterior bladder neck neoplasm was performed and during this the disease process was noted to extend circumferentially around the bladder neck for a total diameter greater than 5 cm.  There was also some involvement of the proximal prostate bilaterally with considerable necrotic tumor noted.  The neoplastic tissue was resected circumferentially at the bladder neck with resection into the lateral prostatic lobes bilaterally with removal of significant necrotic tissue required to provide adequate hemostasis.  Ureteral orifices were not seen during resection.  Once resection was complete, the bladder was evacuated free of chips and hemostasis was achieved.  A 22 French Foley catheter was inserted and the balloon was filled with 30 cc of sterile fluid.  The catheter was irrigated with clear return and placed to straight drainage.  He was taken down from lithotomy position, his anesthetic was reversed and he was moved to recovery in stable condition.  There were no complications.

## 2018-12-15 NOTE — Anesthesia Postprocedure Evaluation (Signed)
Anesthesia Post Note  Patient: Jose Wolfe  Procedure(s) Performed: CYSTOSCOPY TRANSURETHRAL RESECTION OF BLADDER TUMOR (TURBT) (N/A )     Patient location during evaluation: PACU Anesthesia Type: General Level of consciousness: awake and alert Pain management: pain level controlled Vital Signs Assessment: post-procedure vital signs reviewed and stable Respiratory status: spontaneous breathing, nonlabored ventilation and respiratory function stable Cardiovascular status: blood pressure returned to baseline and stable Postop Assessment: no apparent nausea or vomiting Anesthetic complications: no    Last Vitals:  Vitals:   12/15/18 0929 12/15/18 1015  BP: (!) 163/77 (!) 147/79  Pulse: 61 65  Resp: 14 15  Temp: (!) 36.4 C   SpO2: 94% 96%    Last Pain:  Vitals:   12/15/18 1015  TempSrc:   PainSc: [place]

## 2018-12-15 NOTE — Interval H&P Note (Signed)
History and Physical Interval Note:  12/15/2018 7:22 AM  Jose Wolfe  has presented today for surgery, with the diagnosis of BLADDER NECK TUMOR, HISTORY OF PROSTATE CANCER  The various methods of treatment have been discussed with the patient and family. After consideration of risks, benefits and other options for treatment, the patient has consented to  Procedure(s): CYSTOSCOPY TRANSURETHRAL RESECTION OF BLADDER TUMOR (TURBT) (N/A) as a surgical intervention .  The patient's history has been reviewed, patient examined, no change in status, stable for surgery.  I have reviewed the patient's chart and labs.  Questions were answered to the patient's satisfaction.     Irine Seal

## 2018-12-16 ENCOUNTER — Encounter (HOSPITAL_COMMUNITY): Payer: Self-pay | Admitting: Urology

## 2018-12-23 ENCOUNTER — Ambulatory Visit (HOSPITAL_COMMUNITY)
Admission: RE | Admit: 2018-12-23 | Discharge: 2018-12-23 | Disposition: A | Discharge status: Home / Self Care | Payer: Medicare HMO | Source: Ambulatory Visit | Attending: Urology | Admitting: Urology

## 2018-12-23 ENCOUNTER — Other Ambulatory Visit (HOSPITAL_COMMUNITY): Payer: Self-pay | Admitting: Urology

## 2018-12-23 DIAGNOSIS — C61 Malignant neoplasm of prostate: Secondary | ICD-10-CM | POA: Diagnosis not present

## 2018-12-23 DIAGNOSIS — C678 Malignant neoplasm of overlapping sites of bladder: Secondary | ICD-10-CM | POA: Insufficient documentation

## 2018-12-23 DIAGNOSIS — R590 Localized enlarged lymph nodes: Secondary | ICD-10-CM | POA: Diagnosis not present

## 2018-12-23 DIAGNOSIS — C679 Malignant neoplasm of bladder, unspecified: Secondary | ICD-10-CM | POA: Diagnosis not present

## 2018-12-26 DIAGNOSIS — E559 Vitamin D deficiency, unspecified: Secondary | ICD-10-CM | POA: Diagnosis not present

## 2018-12-26 DIAGNOSIS — R9721 Rising PSA following treatment for malignant neoplasm of prostate: Secondary | ICD-10-CM | POA: Diagnosis not present

## 2018-12-26 DIAGNOSIS — C775 Secondary and unspecified malignant neoplasm of intrapelvic lymph nodes: Secondary | ICD-10-CM | POA: Diagnosis not present

## 2018-12-26 DIAGNOSIS — C61 Malignant neoplasm of prostate: Secondary | ICD-10-CM | POA: Diagnosis not present

## 2018-12-29 ENCOUNTER — Other Ambulatory Visit: Payer: Self-pay | Admitting: Urology

## 2018-12-29 DIAGNOSIS — Z5111 Encounter for antineoplastic chemotherapy: Secondary | ICD-10-CM | POA: Diagnosis not present

## 2018-12-29 DIAGNOSIS — C61 Malignant neoplasm of prostate: Secondary | ICD-10-CM | POA: Diagnosis not present

## 2018-12-29 DIAGNOSIS — E559 Vitamin D deficiency, unspecified: Secondary | ICD-10-CM

## 2019-01-12 DIAGNOSIS — C61 Malignant neoplasm of prostate: Secondary | ICD-10-CM | POA: Diagnosis not present

## 2019-02-14 DIAGNOSIS — C61 Malignant neoplasm of prostate: Secondary | ICD-10-CM | POA: Diagnosis not present

## 2019-02-17 ENCOUNTER — Other Ambulatory Visit: Payer: Medicare HMO

## 2019-02-21 ENCOUNTER — Ambulatory Visit
Admission: RE | Admit: 2019-02-21 | Discharge: 2019-02-21 | Disposition: A | Discharge status: Home / Self Care | Payer: Medicare HMO | Source: Ambulatory Visit | Attending: Urology | Admitting: Urology

## 2019-02-21 DIAGNOSIS — Z13820 Encounter for screening for osteoporosis: Secondary | ICD-10-CM | POA: Diagnosis not present

## 2019-02-21 DIAGNOSIS — R69 Illness, unspecified: Secondary | ICD-10-CM | POA: Diagnosis not present

## 2019-02-21 DIAGNOSIS — E559 Vitamin D deficiency, unspecified: Secondary | ICD-10-CM

## 2019-02-22 DIAGNOSIS — C61 Malignant neoplasm of prostate: Secondary | ICD-10-CM | POA: Diagnosis not present

## 2019-02-22 DIAGNOSIS — C775 Secondary and unspecified malignant neoplasm of intrapelvic lymph nodes: Secondary | ICD-10-CM | POA: Diagnosis not present

## 2019-03-16 DIAGNOSIS — C61 Malignant neoplasm of prostate: Secondary | ICD-10-CM | POA: Diagnosis not present

## 2019-03-22 DIAGNOSIS — C61 Malignant neoplasm of prostate: Secondary | ICD-10-CM | POA: Diagnosis not present

## 2019-03-30 DIAGNOSIS — E7800 Pure hypercholesterolemia, unspecified: Secondary | ICD-10-CM | POA: Diagnosis not present

## 2019-03-30 DIAGNOSIS — Z131 Encounter for screening for diabetes mellitus: Secondary | ICD-10-CM | POA: Diagnosis not present

## 2019-04-05 DIAGNOSIS — L309 Dermatitis, unspecified: Secondary | ICD-10-CM | POA: Diagnosis not present

## 2019-04-05 DIAGNOSIS — Z0000 Encounter for general adult medical examination without abnormal findings: Secondary | ICD-10-CM | POA: Diagnosis not present

## 2019-04-05 DIAGNOSIS — J4530 Mild persistent asthma, uncomplicated: Secondary | ICD-10-CM | POA: Diagnosis not present

## 2019-04-06 DIAGNOSIS — C61 Malignant neoplasm of prostate: Secondary | ICD-10-CM | POA: Diagnosis not present

## 2019-04-10 DIAGNOSIS — C61 Malignant neoplasm of prostate: Secondary | ICD-10-CM | POA: Diagnosis not present

## 2019-04-10 DIAGNOSIS — Z192 Hormone resistant malignancy status: Secondary | ICD-10-CM | POA: Diagnosis not present

## 2019-04-10 DIAGNOSIS — R9721 Rising PSA following treatment for malignant neoplasm of prostate: Secondary | ICD-10-CM | POA: Diagnosis not present

## 2019-04-21 DIAGNOSIS — R69 Illness, unspecified: Secondary | ICD-10-CM | POA: Diagnosis not present

## 2019-06-26 DIAGNOSIS — C61 Malignant neoplasm of prostate: Secondary | ICD-10-CM | POA: Diagnosis not present

## 2019-07-03 DIAGNOSIS — C61 Malignant neoplasm of prostate: Secondary | ICD-10-CM | POA: Diagnosis not present

## 2019-07-03 DIAGNOSIS — Z5111 Encounter for antineoplastic chemotherapy: Secondary | ICD-10-CM | POA: Diagnosis not present

## 2019-09-01 DIAGNOSIS — R69 Illness, unspecified: Secondary | ICD-10-CM | POA: Diagnosis not present

## 2019-09-18 ENCOUNTER — Encounter (INDEPENDENT_AMBULATORY_CARE_PROVIDER_SITE_OTHER): Payer: Medicare HMO | Admitting: Ophthalmology

## 2019-09-18 DIAGNOSIS — H40013 Open angle with borderline findings, low risk, bilateral: Secondary | ICD-10-CM | POA: Diagnosis not present

## 2019-09-18 DIAGNOSIS — H524 Presbyopia: Secondary | ICD-10-CM | POA: Diagnosis not present

## 2019-09-18 DIAGNOSIS — H35033 Hypertensive retinopathy, bilateral: Secondary | ICD-10-CM | POA: Diagnosis not present

## 2019-09-18 DIAGNOSIS — Z961 Presence of intraocular lens: Secondary | ICD-10-CM | POA: Diagnosis not present

## 2019-09-28 DIAGNOSIS — C61 Malignant neoplasm of prostate: Secondary | ICD-10-CM | POA: Diagnosis not present

## 2019-09-28 DIAGNOSIS — E559 Vitamin D deficiency, unspecified: Secondary | ICD-10-CM | POA: Diagnosis not present

## 2019-10-04 DIAGNOSIS — C61 Malignant neoplasm of prostate: Secondary | ICD-10-CM | POA: Diagnosis not present

## 2019-10-04 DIAGNOSIS — R1031 Right lower quadrant pain: Secondary | ICD-10-CM | POA: Diagnosis not present

## 2019-10-09 DIAGNOSIS — C61 Malignant neoplasm of prostate: Secondary | ICD-10-CM | POA: Diagnosis not present

## 2019-10-11 ENCOUNTER — Encounter: Payer: Self-pay | Admitting: *Deleted

## 2019-10-13 ENCOUNTER — Telehealth: Payer: Self-pay | Admitting: Oncology

## 2019-10-13 NOTE — Telephone Encounter (Signed)
Received a new patient referral from Dr. Junious Silk at [facility] for prostate cancer. Jose Wolfe has been cld and scheduled to see Dr. Alen Blew on 11/4 at 2pm. Pt has been offered an earlier appt date and time, but declined d/t being out of town. He's been made aware to arrive 15 minutes early.

## 2019-10-16 NOTE — Progress Notes (Addendum)
Histology and Location of Primary Cancer: prostatic adenocarcinoma  Sites of Visceral and Bony Metastatic Disease: pelvic mass  Location(s) of Symptomatic Metastases: new isolated left pelvic met adjacent to left bladder with rising PSA  Past/Anticipated chemotherapy by medical oncology, if any: scheduled to consult with Dr. Alen Blew on 11/4 @ 2 pm.  Pain on a scale of 0-10 is:  Reports "a feeling" that he wouldn't say is pain or discomfort in his right lower abdominal quadrant x 1 month.     If Spine Met(s), symptoms, if any, include:  Bowel/Bladder retention or incontinence (please describe): Reports nocturia x2. Reports on rare occasions due to urinary urgency he may leak urine. Denies dysuria or hematuria. Denies bowel movements.   Numbness or weakness in extremities (please describe): Denies  Current Decadron regimen, if applicable: No  Ambulatory status? Walker? Wheelchair?: Ambulatory  SAFETY ISSUES:  Prior radiation? Seed implant 2011  Pacemaker/ICD? Denies   Possible current pregnancy? no, male patient  Is the patient on methotrexate? denies  Current Complaints / other details:  73 year old male. Married. Retired.   Placed on HERO trial 2019. On Lupron. Apalutamide added 03/22/2019.  Scheduled to follow up with Dr. Junious Silk in January 2021 for a repeat Lupron. Endorses receiving last Lupron injection in July 2020.

## 2019-10-17 ENCOUNTER — Ambulatory Visit
Admission: RE | Admit: 2019-10-17 | Discharge: 2019-10-17 | Disposition: A | Discharge status: Home / Self Care | Payer: Medicare HMO | Source: Ambulatory Visit | Attending: Radiation Oncology | Admitting: Radiation Oncology

## 2019-10-17 ENCOUNTER — Encounter: Payer: Self-pay | Admitting: Radiation Oncology

## 2019-10-17 ENCOUNTER — Other Ambulatory Visit: Payer: Self-pay

## 2019-10-17 ENCOUNTER — Telehealth: Payer: Self-pay | Admitting: Medical Oncology

## 2019-10-17 VITALS — Ht 72.0 in | Wt 182.5 lb

## 2019-10-17 DIAGNOSIS — Z192 Hormone resistant malignancy status: Secondary | ICD-10-CM | POA: Diagnosis not present

## 2019-10-17 DIAGNOSIS — C61 Malignant neoplasm of prostate: Secondary | ICD-10-CM | POA: Insufficient documentation

## 2019-10-17 DIAGNOSIS — Z9289 Personal history of other medical treatment: Secondary | ICD-10-CM | POA: Diagnosis not present

## 2019-10-17 DIAGNOSIS — Z923 Personal history of irradiation: Secondary | ICD-10-CM | POA: Diagnosis not present

## 2019-10-17 DIAGNOSIS — R9721 Rising PSA following treatment for malignant neoplasm of prostate: Secondary | ICD-10-CM | POA: Diagnosis not present

## 2019-10-17 DIAGNOSIS — C7911 Secondary malignant neoplasm of bladder: Secondary | ICD-10-CM | POA: Diagnosis not present

## 2019-10-17 NOTE — Progress Notes (Signed)
Radiation Oncology         [phone] ________________________________  Initial outpatient Consultation - Conducted via MyChart due to current COVID-19 concerns for limiting patient exposure  Name: Fredreick Satterwhite MRN: [id number]  Date: 10/17/2019  DOB: [birth year]  [id number], [name], MD  Festus Aloe, MD   REFERRING PHYSICIAN: Festus Aloe, MD  DIAGNOSIS: 73 y.o. gentleman with recurrent Stage T4, N1 M0 castrate-resistant prostate cancer.    ICD-10-CM   1. Malignant neoplasm of prostate ([place])  C61   2. Metastatic castration-resistant adenocarcinoma of prostate [facility])  C61    Z19.2     HISTORY OF PRESENT ILLNESS: Zalan Henningsen is a 73 y.o. male with a diagnosis of progressive, metastatic castrate-resistant prostate cancer. He was initially diagnosed with a T1c, Gleason 3+3 prostate cancer in 20% of one core  in 2011 with Dr. Risa Grill. He opted to undergo radioactive seed implant as his definitive treatment on 06/04/10 under the care and direction of Dr. Link Snuffer and Dr. Valere Dross. He initially had an excellent response but unfortunately developed PSA recurrent disease in 2013 with a PSA of 2.42 at that time. A repeat prostate biopsy and staging scans were negative.  Since that time, his PSA has continued to rise and was up to 20 in 07/2017. At that point, he was enrolled in the HERO trial involving a new drug, Relogulix. His PSA responded nicely and nadired at 1.49 while he was on the trial.  The trial concluded in September 2019 and his PSA was back up to 3.98 in December 2019 so he was placed on Lupron and bicalutamide at that time.   Due to rising PSA, restaging scans and cystoscopy were performed which showed bladder neck invasion as well as metastatic pelvic lymphadenopathy. He proceeded to TURBT in 11/2018 with final pathology confirming poorly differentiated prostatic adenocarcinoma, Gleason 4+5.   His PSA decreased to 1.8 following TURBT but had increased  back up to 5.09 by 03/16/19.  He was started on Erleada (apalutamide) on 03/22/2019 in addition to Lupron.  His PSA decreased to 2.49 and remained stable until 09/29/2019, when it was noted to be further increased at 4.13. CT A/P was performed on 10/04/2019 for disease restaging and showed a new 2.9 cm soft tissue mass in the left lower pelvis along the lateral aspect of the urinary bladder, consistent with metastatic disease (new as compared to prior CT A/P from 09/2018).  Fortunately, there were no other sites of metastatic disease noted.  The patient reviewed the PSA and recent imaging results with his urologist and he has kindly been referred today for discussion of potential radiation treatment options.  He is also scheduled to meet with Dr. Alen Blew on 11/01/2019 to further discuss other potential options for systemic therapy.  PREVIOUS RADIATION THERAPY: Yes 06/04/2010: Prostate Brachytherapy- 145 Gy definitive therapy (Drs. Valere Dross and Risa Grill)  PAST MEDICAL HISTORY:  Past Medical History:  Diagnosis Date  . COPD (chronic obstructive pulmonary disease) ([place])   . Prostate cancer ([place])       PAST SURGICAL HISTORY: Past Surgical History:  Procedure Laterality Date  . RADIOACTIVE SEED IMPLANT  2011  . TONSILLECTOMY    . TRANSURETHRAL RESECTION OF BLADDER TUMOR N/A 12/15/2018   Procedure: CYSTOSCOPY TRANSURETHRAL RESECTION OF BLADDER TUMOR (TURBT);  Surgeon: Irine Seal, MD;  Location: [facility];  Service: Urology;  Laterality: N/A;    FAMILY HISTORY:  Family History  Problem Relation Age of Onset  . Prostate cancer Brother   . Cancer  Maternal Uncle        unknown type    SOCIAL HISTORY:  Social History   Socioeconomic History  . Marital status: Married    Spouse name: Not on file  . Number of children: 0  . Years of education: Not on file  . Highest education level: Not on file  Occupational History  . Not on file  Social Needs  . Financial resource strain: Not on file  . Food  insecurity    Worry: Not on file    Inability: Not on file  . Transportation needs    Medical: Not on file    Non-medical: Not on file  Tobacco Use  . Smoking status: Never Smoker  . Smokeless tobacco: Never Used  Substance and Sexual Activity  . Alcohol use: Yes    Alcohol/week: 1.0 standard drinks    Types: 1 Glasses of wine per week    Comment: daily with dinner  . Drug use: No  . Sexual activity: Not Currently  Lifestyle  . Physical activity    Days per week: Not on file    Minutes per session: Not on file  . Stress: Not on file  Relationships  . Social Herbalist on phone: Not on file    Gets together: Not on file    Attends religious service: Not on file    Active member of club or organization: Not on file    Attends meetings of clubs or organizations: Not on file    Relationship status: Not on file  . Intimate partner violence    Fear of current or ex partner: Not on file    Emotionally abused: Not on file    Physically abused: Not on file    Forced sexual activity: Not on file  Other Topics Concern  . Not on file  Social History Narrative  . Not on file    ALLERGIES: Patient has no known allergies.  MEDICATIONS:  Current Outpatient Medications  Medication Sig Dispense Refill  . betamethasone dipropionate (DIPROLENE) 0.05 % cream Apply 1 application topically 2 (two) times daily as needed (for psoriasis).   3  . budesonide-formoterol (SYMBICORT) 160-4.5 MCG/ACT inhaler Inhale 2 puffs into the lungs 2 (two) times daily.     . calcium-vitamin D (OSCAL WITH D) 500-200 MG-UNIT tablet Take 1 tablet by mouth.    . ERLEADA 60 MG tablet     . pseudoephedrine-acetaminophen (TYLENOL SINUS) 30-500 MG TABS tablet Take 1 tablet by mouth every 4 (four) hours as needed.    . triamcinolone cream (KENALOG) 0.1 % Apply 1 application topically 2 (two) times daily as needed (for psoriasis).   2  . VENTOLIN HFA 108 (90 Base) MCG/ACT inhaler INHALE 1 PUFF BY MOUTH EVERY  4 HOURS AS NEEDED     No current facility-administered medications for this encounter.     REVIEW OF SYSTEMS:  On review of systems, the patient reports that he is doing well overall. He denies any chest pain, shortness of breath, cough, fevers, chills, night sweats, unintended weight changes. He denies any bowel disturbances, and denies abdominal pain, nausea or vomiting. He denies any new musculoskeletal or joint aches or pains. He reports an abnormal feeling, not necessarily pain or discomfort, in his right lower abdominal quadrant for the last month. He also reports nocturia x2 and rare leakage due to urinary urgency. A complete review of systems is obtained and is otherwise negative.    PHYSICAL EXAM:  Wt Readings from Last 3 Encounters:  10/17/19 182 lb 8 oz (82.8 kg)  12/15/18 194 lb (88 kg)  12/12/18 194 lb 4 oz (88.1 kg)   Temp Readings from Last 3 Encounters:  12/15/18 97.8 F (36.6 C)  12/12/18 97.7 F (36.5 C) (Oral)  06/10/16 99.2 F (37.3 C) (Oral)   BP Readings from Last 3 Encounters:  12/15/18 (!) 147/79  12/12/18 127/71  06/10/16 123/70   Pulse Readings from Last 3 Encounters:  12/15/18 65  12/12/18 66  06/10/16 76   Pain Assessment Pain Score: 2  Pain Frequency: Constant Pain Loc: Abdomen/10  In general this is a well appearing Caucasian gentleman in no acute distress. He's alert and oriented x4 and appropriate throughout the examination. Cardiopulmonary assessment is negative for acute distress and he exhibits normal effort.    KPS = 90  100 - Normal; no complaints; no evidence of disease. 90   - Able to carry on normal activity; minor signs or symptoms of disease. 80   - Normal activity with effort; some signs or symptoms of disease. 15   - Cares for self; unable to carry on normal activity or to do active work. 60   - Requires occasional assistance, but is able to care for most of his personal needs. 50   - Requires considerable assistance and  frequent medical care. 52   - Disabled; requires special care and assistance. 97   - Severely disabled; hospital admission is indicated although death not imminent. 16   - Very sick; hospital admission necessary; active supportive treatment necessary. 10   - Moribund; fatal processes progressing rapidly. 0     - Dead  Karnofsky DA, Abelmann Sibley, Craver LS and Burchenal [facility] [phone]) The use of the nitrogen mustards in the palliative treatment of carcinoma: with particular reference to bronchogenic carcinoma Cancer 1 634-56  LABORATORY DATA:  Lab Results  Component Value Date   WBC 7.3 12/12/2018   HGB 13.3 12/12/2018   HCT 42.1 12/12/2018   MCV 95.2 12/12/2018   PLT 289 12/12/2018   Lab Results  Component Value Date   NA 142 12/12/2018   K 3.9 12/12/2018   CL 105 12/12/2018   CO2 29 12/12/2018   Lab Results  Component Value Date   ALT 37 05/28/2010   AST 28 05/28/2010   ALKPHOS 56 05/28/2010   BILITOT 0.7 05/28/2010     RADIOGRAPHY: No results found.    IMPRESSION/PLAN:  This visit was conducted via MyChart to spare the patient unnecessary potential exposure in the healthcare setting during the current COVID-19 pandemic  1. 73 y.o. gentleman with Stage T4, N1 castrate-resistant prostate cancer.  Today, we talked to the patient about the findings and workup thus far. We discussed the natural history of metastatic castrate resistant prostatic adenocarcinoma and general treatment, highlighting the role of salvage radiotherapy in the management of oligometastatic disease. We discussed the available radiation techniques, and focused on the details of logistics and delivery.  The recommendation is to proceed with a likely 4 to 6-week course of daily external beam radiation directed to the pelvic/bladder mass.  We discussed that the duration of treatment would be dependent on his simulation/treatment planning scan and could potentially be completed in a shorter course but maximum duration  would be 6 weeks.  We reviewed the anticipated acute and late sequelae associated with radiation in this setting. The patient was encouraged to ask questions that were answered to his satisfaction.  At the  end of the conversation the patient is interested in moving forward with salvage radiation therapy to the new left pelvic/bladder mass.  He appears to have a good understanding of his disease and our recommendations and has provided verbal consent to proceed today.  He is scheduled for CT simulation/treatment planning this Friday, 10/20/2019 at 2 pm in anticipation of beginning his treatment on 10/30/2019 once he returns from a planned vacation to [place], [place]. We will share our discussion with Dr. Junious Silk and Dr. Alen Blew and move forward with treatment planning accordingly.  Currently, the plan is to continue Erleada and Lupron.  Given current concerns for patient exposure during the COVID-19 pandemic, this encounter was conducted via video-enabled MyChart visit. The patient has given verbal consent for this type of encounter. The time spent during this encounter was 60 minutes. The attendants for this meeting include Tyler Pita MD, Ashlyn Bruning PA-C, [place], and patient, Tanuj Tenzer. During the encounter, Tyler Pita MD, Ashlyn Bruning PA-C, and scribe, Wilburn Mylar were located at [place].  Patient, Sencere Vanname was located at home.    Nicholos Johns, PA-C    Tyler Pita, MD  [facility] Direct Dial: [phone]  Fax: [phone] [email or web address]  Skype  [email or web address]  This document serves as a record of services personally performed by Tyler Pita, MD and Freeman Caldron, PA-C. It was created on their behalf by Wilburn Mylar, a trained medical scribe. The creation of this record is based on the scribe's personal observations and the provider's  statements to them. This document has been checked and approved by the attending provider.

## 2019-10-17 NOTE — Telephone Encounter (Signed)
Opened in error

## 2019-10-18 DIAGNOSIS — C61 Malignant neoplasm of prostate: Secondary | ICD-10-CM | POA: Insufficient documentation

## 2019-10-18 DIAGNOSIS — Z192 Hormone resistant malignancy status: Secondary | ICD-10-CM | POA: Insufficient documentation

## 2019-10-20 ENCOUNTER — Ambulatory Visit
Admission: RE | Admit: 2019-10-20 | Discharge: 2019-10-20 | Disposition: A | Discharge status: Home / Self Care | Payer: Medicare HMO | Source: Ambulatory Visit | Attending: Radiation Oncology | Admitting: Radiation Oncology

## 2019-10-20 ENCOUNTER — Other Ambulatory Visit: Payer: Self-pay

## 2019-10-20 ENCOUNTER — Encounter: Payer: Self-pay | Admitting: Medical Oncology

## 2019-10-20 DIAGNOSIS — C61 Malignant neoplasm of prostate: Secondary | ICD-10-CM | POA: Diagnosis not present

## 2019-10-20 DIAGNOSIS — C7989 Secondary malignant neoplasm of other specified sites: Secondary | ICD-10-CM | POA: Diagnosis not present

## 2019-10-20 NOTE — Progress Notes (Signed)
Introduced myself to Jose Wolfe as the prostate nurse navigator and discussed my role. He was diagnosed with prostate cancer in 2011 and had a seed implant. After about 3 years his PSA began to rise and he has been treated with ADT. He recently had CT staging and showed a mass near the bladder and will begin radiation 11/4. He also has a consult with Dr. Alen Blew on 11/4. I gave him my business card and asked him to call me with questions or concerns. He voiced understanding.

## 2019-10-23 NOTE — Progress Notes (Signed)
  Radiation Oncology         [phone] ________________________________  Name: Jose Wolfe MRN: [id number]  Date: 10/20/2019  DOB: [birth year]  SIMULATION AND TREATMENT PLANNING NOTE    ICD-10-CM   1. Malignant neoplasm of prostate ([place])  C61     DIAGNOSIS:  73 y.o. gentleman with recurrent Stage T4, N1 M0 castrate-resistant prostate cancer  NARRATIVE:  The patient was brought to the [facility].  Identity was confirmed.  All relevant records and images related to the planned course of therapy were reviewed.  The patient freely provided informed written consent to proceed with treatment after reviewing the details related to the planned course of therapy. The consent form was witnessed and verified by the simulation staff.  Then, the patient was set-up in a stable reproducible supine position for radiation therapy.  A vacuum lock pillow device was custom fabricated to position his legs in a reproducible immobilized position.  Then, I performed a urethrogram under sterile conditions to identify the prostatic apex.  CT images were obtained.  Surface markings were placed.  The CT images were loaded into the planning software.  Then the prostate target and avoidance structures including the rectum, bladder, bowel and hips were contoured.  Treatment planning then occurred.  The radiation prescription was entered and confirmed.  A total of one complex treatment devices was fabricated. I have requested : Intensity Modulated Radiotherapy (IMRT) is medically necessary for this case for the following reason:  Rectal sparing.Marland Kitchen  PLAN:  The patient will receive 60 Gy in 20 fractions.  ________________________________  Sheral Apley Tammi Klippel, M.D.

## 2019-10-30 DIAGNOSIS — C61 Malignant neoplasm of prostate: Secondary | ICD-10-CM | POA: Diagnosis not present

## 2019-10-30 DIAGNOSIS — C7989 Secondary malignant neoplasm of other specified sites: Secondary | ICD-10-CM | POA: Insufficient documentation

## 2019-11-01 ENCOUNTER — Other Ambulatory Visit: Payer: Self-pay

## 2019-11-01 ENCOUNTER — Inpatient Hospital Stay: Payer: Medicare HMO | Attending: Oncology | Admitting: Oncology

## 2019-11-01 ENCOUNTER — Ambulatory Visit
Admission: RE | Admit: 2019-11-01 | Discharge: 2019-11-01 | Disposition: A | Discharge status: Home / Self Care | Payer: Medicare HMO | Source: Ambulatory Visit | Attending: Radiation Oncology | Admitting: Radiation Oncology

## 2019-11-01 VITALS — BP 122/65 | HR 75 | Temp 98.5°F | Resp 17 | Ht 72.0 in | Wt 191.1 lb

## 2019-11-01 DIAGNOSIS — R9721 Rising PSA following treatment for malignant neoplasm of prostate: Secondary | ICD-10-CM | POA: Diagnosis not present

## 2019-11-01 DIAGNOSIS — Z192 Hormone resistant malignancy status: Secondary | ICD-10-CM | POA: Diagnosis not present

## 2019-11-01 DIAGNOSIS — C61 Malignant neoplasm of prostate: Secondary | ICD-10-CM | POA: Diagnosis not present

## 2019-11-01 DIAGNOSIS — Z8042 Family history of malignant neoplasm of prostate: Secondary | ICD-10-CM

## 2019-11-01 DIAGNOSIS — C7989 Secondary malignant neoplasm of other specified sites: Secondary | ICD-10-CM

## 2019-11-01 DIAGNOSIS — Z809 Family history of malignant neoplasm, unspecified: Secondary | ICD-10-CM

## 2019-11-01 NOTE — Progress Notes (Signed)
Reason for the request:    Prostate cancer  HPI: I was asked by Dr. Junious Silk to evaluate Jose Wolfe with diagnosis of prostate cancer.  He is a 73 year old man diagnosed with prostate cancer in 2011.  At that time he had a PSA of 7.85 and a Gleason score of 3+3 = 6.  He was treated with brachytherapy and seed implants at that time.  He remains on active surveillance with PSA under control till about 2013.  He had a repeat biopsy at that time that was negative.  He enrolled in the HERO trial and he had a initial PSA response and subsequently his PSA was up to 20 in 2018.  He was started on Lupron at that time and PSA declined appropriately down to 1.8 in January 2020.  In February 2020 was 2.63 and subsequently in March 2019 was 5.09.  He was was started on apalutamide with initial PSA decline to 2.49.  His PSA on October 2 was up again to 4.13.  CT scan of the abdomen and pelvis on 10/04/2019 showed a 2.9 soft tissue mass in the left lower pelvis along the lateral aspect of the bladder that is consistent with metastatic disease.  He had a cystoscopy back in December 2019 and found to have a bladder neck neoplasm that was a poorly differentiated carcinoma of the prostate.  Based on these findings, he was evaluated by Dr. Tammi Klippel and have started radiation therapy to the pelvis and the bladder because of his local recurrence and a rise in his PSA.  Clinically, he is asymptomatic at this time.  He denies any abdominal pain discomfort or urinary complaints.  He denies any hematuria or dysuria.   He does not report any headaches, blurry vision, syncope or seizures. Does not report any fevers, chills or sweats.  Does not report any cough, wheezing or hemoptysis.  Does not report any chest pain, palpitation, orthopnea or leg edema.  Does not report any nausea, vomiting or abdominal pain.  Does not report any constipation or diarrhea.  Does not report any skeletal complaints.    Does not report frequency, urgency or  hematuria.  Does not report any skin rashes or lesions. Does not report any heat or cold intolerance.  Does not report any lymphadenopathy or petechiae.  Does not report any anxiety or depression.  Remaining review of systems is negative.    Past Medical History:  Diagnosis Date  . COPD (chronic obstructive pulmonary disease) ([place])   . Prostate cancer [facility])   :  Past Surgical History:  Procedure Laterality Date  . RADIOACTIVE SEED IMPLANT  2011  . TONSILLECTOMY    . TRANSURETHRAL RESECTION OF BLADDER TUMOR N/A 12/15/2018   Procedure: CYSTOSCOPY TRANSURETHRAL RESECTION OF BLADDER TUMOR (TURBT);  Surgeon: Irine Seal, MD;  Location: [facility];  Service: Urology;  Laterality: N/A;  :   Current Outpatient Medications:  .  betamethasone dipropionate (DIPROLENE) 0.05 % cream, Apply 1 application topically 2 (two) times daily as needed (for psoriasis). , Disp: , Rfl: 3 .  budesonide-formoterol (SYMBICORT) 160-4.5 MCG/ACT inhaler, Inhale 2 puffs into the lungs 2 (two) times daily. , Disp: , Rfl:  .  calcium-vitamin D (OSCAL WITH D) 500-200 MG-UNIT tablet, Take 1 tablet by mouth., Disp: , Rfl:  .  ERLEADA 60 MG tablet, , Disp: , Rfl:  .  pseudoephedrine-acetaminophen (TYLENOL SINUS) 30-500 MG TABS tablet, Take 1 tablet by mouth every 4 (four) hours as needed., Disp: , Rfl:  .  triamcinolone cream (KENALOG) 0.1 %, Apply 1 application topically 2 (two) times daily as needed (for psoriasis). , Disp: , Rfl: 2 .  VENTOLIN HFA 108 (90 Base) MCG/ACT inhaler, INHALE 1 PUFF BY MOUTH EVERY 4 HOURS AS NEEDED, Disp: , Rfl: :  No Known Allergies:  Family History  Problem Relation Age of Onset  . Prostate cancer Brother   . Cancer Maternal Uncle        unknown type  :  Social History   Socioeconomic History  . Marital status: Married    Spouse name: Not on file  . Number of children: 0  . Years of education: Not on file  . Highest education level: Not on file  Occupational History  . Not on file   Social Needs  . Financial resource strain: Not on file  . Food insecurity    Worry: Not on file    Inability: Not on file  . Transportation needs    Medical: Not on file    Non-medical: Not on file  Tobacco Use  . Smoking status: Never Smoker  . Smokeless tobacco: Never Used  Substance and Sexual Activity  . Alcohol use: Yes    Alcohol/week: 1.0 standard drinks    Types: 1 Glasses of wine per week    Comment: daily with dinner  . Drug use: No  . Sexual activity: Not Currently  Lifestyle  . Physical activity    Days per week: Not on file    Minutes per session: Not on file  . Stress: Not on file  Relationships  . Social Herbalist on phone: Not on file    Gets together: Not on file    Attends religious service: Not on file    Active member of club or organization: Not on file    Attends meetings of clubs or organizations: Not on file    Relationship status: Not on file  . Intimate partner violence    Fear of current or ex partner: Not on file    Emotionally abused: Not on file    Physically abused: Not on file    Forced sexual activity: Not on file  Other Topics Concern  . Not on file  Social History Narrative  . Not on file  :    Exam: Blood pressure 122/65, pulse 75, temperature 98.5 F (36.9 C), temperature source Temporal, resp. rate 17, height 6' (1.829 m), weight 191 lb 1.6 oz (86.7 kg), SpO2 100 %.  ECOG 0 General appearance: alert and cooperative appeared without distress. Head: atraumatic without any abnormalities. Eyes: conjunctivae/corneas clear. PERRL.  Sclera anicteric. Throat: lips, mucosa, and tongue normal; without oral thrush or ulcers. Resp: clear to auscultation bilaterally without rhonchi, wheezes or dullness to percussion. Cardio: regular rate and rhythm, S1, S2 normal, no murmur, click, rub or gallop GI: soft, non-tender; bowel sounds normal; no masses,  no organomegaly Skin: Skin color, texture, turgor normal. No rashes or  lesions Lymph nodes: Cervical, supraclavicular, and axillary nodes normal. Neurologic: Grossly normal without any motor, sensory or deep tendon reflexes. Musculoskeletal: No joint deformity or effusion.  CBC    Component Value Date/Time   WBC 7.3 12/12/2018 1230   RBC 4.42 12/12/2018 1230   HGB 13.3 12/12/2018 1230   HCT 42.1 12/12/2018 1230   PLT 289 12/12/2018 1230   MCV 95.2 12/12/2018 1230   MCH 30.1 12/12/2018 1230   MCHC 31.6 12/12/2018 1230   RDW 13.2 12/12/2018 1230  Chemistry      Component Value Date/Time   NA 142 12/12/2018 1230   K 3.9 12/12/2018 1230   CL 105 12/12/2018 1230   CO2 29 12/12/2018 1230   BUN 18 12/12/2018 1230   CREATININE 1.01 12/12/2018 1230      Component Value Date/Time   CALCIUM 8.8 (L) 12/12/2018 1230   ALKPHOS 56 05/28/2010 1325   AST 28 05/28/2010 1325   ALT 37 05/28/2010 1325   BILITOT 0.7 05/28/2010 1325      Assessment and Plan:    73 year old with:  1.  Prostate cancer diagnosed in 2011.  He was found to have a Gleason score 6 and PSA of 7.85.  He status post a seed implant brachytherapy and subsequently developed recurrent disease.  He was treated initially with androgen deprivation therapy as part of a clinical trial and subsequently off trial in 2018.  He developed a castration- resistant disease in March 2020 and has been on apalutamide.  He did develop the recent rise in his PSA as well as pelvic metastasis.  The natural course of this disease and treatment options were reviewed today.  He has commences radiation therapy to his pelvic recurrence which is a reasonable approach at this time.  Given the slow rise in his PSA and the fact that he does not have white spread metastasis, local therapy with radiation is appropriate.  Alternative systemic therapy was reviewed today which include systemic chemotherapy is really the only modality he has not received at this time.  Complication associated with chemotherapy include  nausea, vomiting, myelosuppression, neutropenia and neutropenic sepsis.  It would be reasonable to defer this option after his radiation and monitoring his PSA and clinical status.  If his disease continues to be spreading with documented rise in his PSA and other metastatic disease areas of involvement then systemic chemotherapy would be his best option.  He understands that his disease at this time is not curable and any treatment is palliative.  His performance status and quality of life remain excellent and he is asymptomatic from his disease and systemic chemotherapy will be deferred for the time being.  2.  Follow-up: Will be after he completes radiation therapy he is scheduled to have a repeat to PSA with Dr. Junious Silk in January 2021.  I will see him shortly afterwards to determine next steps.   60 minutes was spent with the patient face-to-face today.  More than 50% of time was spent on reviewing his disease status, reviewing imaging studies, pathology reports, treatment options and answering questions regarding future plan of care    Thank you for the referral.  A copy of this consult has been forwarded to the requesting physician.

## 2019-11-02 ENCOUNTER — Other Ambulatory Visit: Payer: Self-pay

## 2019-11-02 ENCOUNTER — Telehealth: Payer: Self-pay | Admitting: Oncology

## 2019-11-02 ENCOUNTER — Ambulatory Visit
Admission: RE | Admit: 2019-11-02 | Discharge: 2019-11-02 | Disposition: A | Discharge status: Home / Self Care | Payer: Medicare HMO | Source: Ambulatory Visit | Attending: Radiation Oncology | Admitting: Radiation Oncology

## 2019-11-02 DIAGNOSIS — C7989 Secondary malignant neoplasm of other specified sites: Secondary | ICD-10-CM | POA: Diagnosis not present

## 2019-11-02 DIAGNOSIS — C61 Malignant neoplasm of prostate: Secondary | ICD-10-CM | POA: Diagnosis not present

## 2019-11-02 NOTE — Telephone Encounter (Signed)
No los per 11/4 °

## 2019-11-03 ENCOUNTER — Other Ambulatory Visit: Payer: Self-pay

## 2019-11-03 ENCOUNTER — Ambulatory Visit
Admission: RE | Admit: 2019-11-03 | Discharge: 2019-11-03 | Disposition: A | Discharge status: Home / Self Care | Payer: Medicare HMO | Source: Ambulatory Visit | Attending: Radiation Oncology | Admitting: Radiation Oncology

## 2019-11-03 DIAGNOSIS — C7989 Secondary malignant neoplasm of other specified sites: Secondary | ICD-10-CM | POA: Diagnosis not present

## 2019-11-03 DIAGNOSIS — C61 Malignant neoplasm of prostate: Secondary | ICD-10-CM | POA: Diagnosis not present

## 2019-11-06 ENCOUNTER — Other Ambulatory Visit: Payer: Self-pay

## 2019-11-06 ENCOUNTER — Ambulatory Visit
Admission: RE | Admit: 2019-11-06 | Discharge: 2019-11-06 | Disposition: A | Discharge status: Home / Self Care | Payer: Medicare HMO | Source: Ambulatory Visit | Attending: Radiation Oncology | Admitting: Radiation Oncology

## 2019-11-06 DIAGNOSIS — C7989 Secondary malignant neoplasm of other specified sites: Secondary | ICD-10-CM | POA: Diagnosis not present

## 2019-11-06 DIAGNOSIS — C61 Malignant neoplasm of prostate: Secondary | ICD-10-CM | POA: Diagnosis not present

## 2019-11-07 ENCOUNTER — Other Ambulatory Visit: Payer: Self-pay

## 2019-11-07 ENCOUNTER — Ambulatory Visit
Admission: RE | Admit: 2019-11-07 | Discharge: 2019-11-07 | Disposition: A | Discharge status: Home / Self Care | Payer: Medicare HMO | Source: Ambulatory Visit | Attending: Radiation Oncology | Admitting: Radiation Oncology

## 2019-11-07 DIAGNOSIS — C61 Malignant neoplasm of prostate: Secondary | ICD-10-CM | POA: Diagnosis not present

## 2019-11-07 DIAGNOSIS — C7989 Secondary malignant neoplasm of other specified sites: Secondary | ICD-10-CM | POA: Diagnosis not present

## 2019-11-08 ENCOUNTER — Other Ambulatory Visit: Payer: Self-pay

## 2019-11-08 ENCOUNTER — Ambulatory Visit
Admission: RE | Admit: 2019-11-08 | Discharge: 2019-11-08 | Disposition: A | Discharge status: Home / Self Care | Payer: Medicare HMO | Source: Ambulatory Visit | Attending: Radiation Oncology | Admitting: Radiation Oncology

## 2019-11-08 DIAGNOSIS — C61 Malignant neoplasm of prostate: Secondary | ICD-10-CM | POA: Diagnosis not present

## 2019-11-08 DIAGNOSIS — C7989 Secondary malignant neoplasm of other specified sites: Secondary | ICD-10-CM | POA: Diagnosis not present

## 2019-11-09 ENCOUNTER — Other Ambulatory Visit: Payer: Self-pay

## 2019-11-09 ENCOUNTER — Ambulatory Visit
Admission: RE | Admit: 2019-11-09 | Discharge: 2019-11-09 | Disposition: A | Discharge status: Home / Self Care | Payer: Medicare HMO | Source: Ambulatory Visit | Attending: Radiation Oncology | Admitting: Radiation Oncology

## 2019-11-09 DIAGNOSIS — C61 Malignant neoplasm of prostate: Secondary | ICD-10-CM | POA: Diagnosis not present

## 2019-11-09 DIAGNOSIS — C7989 Secondary malignant neoplasm of other specified sites: Secondary | ICD-10-CM | POA: Diagnosis not present

## 2019-11-10 ENCOUNTER — Ambulatory Visit
Admission: RE | Admit: 2019-11-10 | Discharge: 2019-11-10 | Disposition: A | Discharge status: Home / Self Care | Payer: Medicare HMO | Source: Ambulatory Visit | Attending: Radiation Oncology | Admitting: Radiation Oncology

## 2019-11-10 ENCOUNTER — Other Ambulatory Visit: Payer: Self-pay

## 2019-11-10 DIAGNOSIS — C7989 Secondary malignant neoplasm of other specified sites: Secondary | ICD-10-CM | POA: Diagnosis not present

## 2019-11-10 DIAGNOSIS — C61 Malignant neoplasm of prostate: Secondary | ICD-10-CM | POA: Diagnosis not present

## 2019-11-13 ENCOUNTER — Ambulatory Visit
Admission: RE | Admit: 2019-11-13 | Discharge: 2019-11-13 | Disposition: A | Discharge status: Home / Self Care | Payer: Medicare HMO | Source: Ambulatory Visit | Attending: Radiation Oncology | Admitting: Radiation Oncology

## 2019-11-13 ENCOUNTER — Other Ambulatory Visit: Payer: Self-pay

## 2019-11-13 DIAGNOSIS — C7989 Secondary malignant neoplasm of other specified sites: Secondary | ICD-10-CM | POA: Diagnosis not present

## 2019-11-13 DIAGNOSIS — C61 Malignant neoplasm of prostate: Secondary | ICD-10-CM | POA: Diagnosis not present

## 2019-11-14 ENCOUNTER — Encounter: Payer: Self-pay | Admitting: Radiation Oncology

## 2019-11-14 ENCOUNTER — Ambulatory Visit
Admission: RE | Admit: 2019-11-14 | Discharge: 2019-11-14 | Disposition: A | Discharge status: Home / Self Care | Payer: Medicare HMO | Source: Ambulatory Visit | Attending: Radiation Oncology | Admitting: Radiation Oncology

## 2019-11-14 ENCOUNTER — Ambulatory Visit: Payer: Medicare HMO

## 2019-11-14 ENCOUNTER — Other Ambulatory Visit: Payer: Self-pay

## 2019-11-14 DIAGNOSIS — C61 Malignant neoplasm of prostate: Secondary | ICD-10-CM | POA: Diagnosis not present

## 2019-11-14 DIAGNOSIS — C7989 Secondary malignant neoplasm of other specified sites: Secondary | ICD-10-CM | POA: Diagnosis not present

## 2019-11-15 ENCOUNTER — Ambulatory Visit: Payer: Medicare HMO

## 2019-11-16 ENCOUNTER — Ambulatory Visit: Payer: Medicare HMO

## 2019-11-17 ENCOUNTER — Ambulatory Visit: Payer: Medicare HMO

## 2019-11-20 ENCOUNTER — Ambulatory Visit: Payer: Medicare HMO

## 2019-11-21 ENCOUNTER — Ambulatory Visit: Payer: Medicare HMO

## 2019-11-22 ENCOUNTER — Ambulatory Visit: Payer: Medicare HMO

## 2019-11-27 ENCOUNTER — Ambulatory Visit: Payer: Medicare HMO

## 2019-11-28 ENCOUNTER — Ambulatory Visit: Payer: Medicare HMO

## 2019-11-29 ENCOUNTER — Ambulatory Visit: Payer: Medicare HMO

## 2019-11-30 ENCOUNTER — Ambulatory Visit: Payer: Medicare HMO

## 2019-12-01 ENCOUNTER — Ambulatory Visit: Payer: Medicare HMO

## 2019-12-04 ENCOUNTER — Ambulatory Visit: Payer: Medicare HMO

## 2019-12-05 ENCOUNTER — Ambulatory Visit: Payer: Medicare HMO

## 2019-12-06 ENCOUNTER — Ambulatory Visit: Payer: Medicare HMO

## 2019-12-07 ENCOUNTER — Ambulatory Visit: Payer: Medicare HMO

## 2019-12-08 ENCOUNTER — Ambulatory Visit: Payer: Medicare HMO

## 2019-12-11 ENCOUNTER — Telehealth: Payer: Self-pay | Admitting: *Deleted

## 2019-12-11 ENCOUNTER — Ambulatory Visit: Payer: Medicare HMO

## 2019-12-11 NOTE — Telephone Encounter (Signed)
CALLED PATIENT TO REMIND THAT WED. FU WILL BE ON THE PHONE AND NOT IN PERSON, PHONE CALL TO BE MADE @ 2 PM BY ASHLYN BRUNING, SPOKE WITH PATIENT AND HE IS AWARE OF THIS

## 2019-12-12 ENCOUNTER — Ambulatory Visit: Payer: Medicare HMO

## 2019-12-13 ENCOUNTER — Ambulatory Visit: Payer: Medicare HMO

## 2019-12-13 ENCOUNTER — Other Ambulatory Visit: Payer: Self-pay

## 2019-12-13 ENCOUNTER — Ambulatory Visit
Admission: RE | Admit: 2019-12-13 | Discharge: 2019-12-13 | Disposition: A | Discharge status: Home / Self Care | Payer: Medicare HMO | Source: Ambulatory Visit | Attending: Urology | Admitting: Urology

## 2019-12-13 DIAGNOSIS — C61 Malignant neoplasm of prostate: Secondary | ICD-10-CM

## 2019-12-13 DIAGNOSIS — Z192 Hormone resistant malignancy status: Secondary | ICD-10-CM

## 2019-12-13 NOTE — Progress Notes (Signed)
Radiation Oncology         ([phone]) [phone] ________________________________  Name: Jose Wolfe MRN: [id number]  Date: 12/13/2019  DOB: [birth year]  Post Treatment Note  CC: [name], MD  [name], MD  Diagnosis:   73 y.o. gentleman with Stage T4, N1 castrate-resistant prostate cancer with oligometastatic disease along the left lateral aspect of the urinary bladder  Interval Since Last Radiation:  4 weeks  11/01/19 - 11/14/19:  The oligometastatic disease in the prostate/left lateral bladder was treated to 50 Gy in 10 fxs of 5 Gy each.   Narrative:  I spoke with the patient to conduct his routine scheduled 1 month follow up visit via telephone to spare the patient unnecessary potential exposure in the healthcare setting during the current COVID-19 pandemic.  The patient was notified in advance and gave permission to proceed with this visit format. He tolerated his radiation treatments well without any notable adverse/ill effects.                              On review of systems, the patient states that he is doing very well overall.  He reports no issues whatsoever throughout the course of his radiation and specifically denies gross hematuria, dysuria, excessive urinary frequency, urgency or incontinence.  He reports a healthy appetite and is maintaining his weight.  He denies any abdominal pain, nausea, vomiting, diarrhea or constipation.  Overall, he is quite pleased with his progress to date and anxious to see how his PSA has responded come January 2021 when he is due for his next lab check with Dr. Junious Silk.  He did meet with Dr. Alen Blew on 11/01/2019 to discuss further systemic treatment options and the decision at that time was to wait and see how his PSA response to radiation and pending those results, will follow-up thereafter for further discussion.  ALLERGIES:  has No Known Allergies.  Meds: Current Outpatient Medications  Medication Sig Dispense Refill    . betamethasone dipropionate (DIPROLENE) 0.05 % cream Apply 1 application topically 2 (two) times daily as needed (for psoriasis).   3  . budesonide-formoterol (SYMBICORT) 160-4.5 MCG/ACT inhaler Inhale 2 puffs into the lungs 2 (two) times daily.     . calcium-vitamin D (OSCAL WITH D) 500-200 MG-UNIT tablet Take 1 tablet by mouth.    . ERLEADA 60 MG tablet     . pseudoephedrine-acetaminophen (TYLENOL SINUS) 30-500 MG TABS tablet Take 1 tablet by mouth every 4 (four) hours as needed.    . triamcinolone cream (KENALOG) 0.1 % Apply 1 application topically 2 (two) times daily as needed (for psoriasis).   2  . VENTOLIN HFA 108 (90 Base) MCG/ACT inhaler INHALE 1 PUFF BY MOUTH EVERY 4 HOURS AS NEEDED     No current facility-administered medications for this encounter.    Physical Findings:  vitals were not taken for this visit.   /Unable to assess due to telephone follow up visit format.   Lab Findings: Lab Results  Component Value Date   WBC 7.3 12/12/2018   HGB 13.3 12/12/2018   HCT 42.1 12/12/2018   MCV 95.2 12/12/2018   PLT 289 12/12/2018     Radiographic Findings: No results found.  Impression/Plan: 1. 73 y.o. gentleman with Stage T4, N1 castrate-resistant prostate cancer with oligometastatic disease along the left lateral aspect of the urinary bladder.  He appears to have recovered well from the effects of his recent palliative  radiotherapy to the prostate/bladder. He is currently without complaints.  We discussed that while we are happy to continue to participate in his care if clinically indicated, at this point, we will plan for follow-up on an as-needed basis.  He will continue in routine follow-up under the care and direction of Dr. Junious Silk and Dr. Alen Blew for continued systemic disease management.  We look forward to continuing to follow his progress via correspondence.  He knows that he is welcome to call at anytime with any questions or concerns related to his previous  radiotherapy.    Nicholos Johns, PA-C

## 2019-12-14 ENCOUNTER — Ambulatory Visit: Payer: Self-pay | Admitting: Urology

## 2019-12-14 ENCOUNTER — Ambulatory Visit: Payer: Medicare HMO

## 2019-12-15 ENCOUNTER — Ambulatory Visit: Payer: Medicare HMO

## 2019-12-15 NOTE — Progress Notes (Signed)
  Radiation Oncology         [phone]) [phone] ________________________________  Name: Jose Wolfe MRN: [id number]  Date: 11/14/2019  DOB: 1946-05-11  End of Treatment Note  Diagnosis:   73 y.o.gentleman with Stage T4, N1 castrate-resistant prostate cancer with oligometastatic disease along the left lateral aspect of the urinary bladder     Indication for treatment:  Palliative       Radiation treatment dates:   11/01/2019 - 11/14/2019  Site/dose:   The oligometastatic disease in the prostate/left lateral bladder was treated to 50 Gy in 10 fxs of 5 Gy each  Beams/energy:   IMRT, photons / 6X  Narrative: The patient tolerated radiation treatment relatively well. He reported nocturia x2-3. He denied fatigue, pain, dysuria or hematuria, difficulty emptying his bladder, stream weakening, rectal bleeding, and diarrhea or constipation.  Plan: The patient has completed radiation treatment. The patient will return to radiation oncology clinic for routine followup in one month. I advised him to call or return sooner if he has any questions or concerns related to his recovery or treatment. ________________________________  Sheral Apley. Tammi Klippel, M.D.   This document serves as a record of services personally performed by Tyler Pita, MD. It was created on his behalf by Wilburn Mylar, a trained medical scribe. The creation of this record is based on the scribe's personal observations and the provider's statements to them. This document has been checked and approved by the attending provider.

## 2019-12-18 ENCOUNTER — Ambulatory Visit: Payer: Medicare HMO

## 2019-12-18 DIAGNOSIS — C61 Malignant neoplasm of prostate: Secondary | ICD-10-CM | POA: Diagnosis not present

## 2019-12-18 DIAGNOSIS — E7800 Pure hypercholesterolemia, unspecified: Secondary | ICD-10-CM | POA: Diagnosis not present

## 2019-12-18 DIAGNOSIS — J4530 Mild persistent asthma, uncomplicated: Secondary | ICD-10-CM | POA: Diagnosis not present

## 2019-12-19 ENCOUNTER — Ambulatory Visit: Payer: Medicare HMO

## 2019-12-20 ENCOUNTER — Ambulatory Visit: Payer: Medicare HMO

## 2020-01-22 DIAGNOSIS — C61 Malignant neoplasm of prostate: Secondary | ICD-10-CM | POA: Diagnosis not present

## 2020-01-29 DIAGNOSIS — C775 Secondary and unspecified malignant neoplasm of intrapelvic lymph nodes: Secondary | ICD-10-CM | POA: Diagnosis not present

## 2020-01-29 DIAGNOSIS — Z192 Hormone resistant malignancy status: Secondary | ICD-10-CM | POA: Diagnosis not present

## 2020-01-29 DIAGNOSIS — C61 Malignant neoplasm of prostate: Secondary | ICD-10-CM | POA: Diagnosis not present

## 2020-02-05 ENCOUNTER — Ambulatory Visit: Payer: Medicare HMO | Attending: Internal Medicine

## 2020-02-05 DIAGNOSIS — Z23 Encounter for immunization: Secondary | ICD-10-CM | POA: Insufficient documentation

## 2020-02-05 NOTE — Progress Notes (Signed)
   Covid-19 Vaccination Clinic  Name:  Jose Wolfe    MRN: [id number] DOB: 1946/03/24  02/05/2020  [name] was observed post Covid-19 immunization for 15 minutes without incidence. He was provided with Vaccine Information Sheet and instruction to access the V-Safe system.   [name] was instructed to call 911 with any severe reactions post vaccine: Marland Kitchen Difficulty breathing  . Swelling of your face and throat  . A fast heartbeat  . A bad rash all over your body  . Dizziness and weakness    Immunizations Administered    Name Date Dose VIS Date Route   Pfizer COVID-19 Vaccine 02/05/2020  3:55 PM 0.3 mL 12/08/2019 Intramuscular   Manufacturer: [place]   Lot: [id number]   [place]: [id number]

## 2020-02-29 ENCOUNTER — Ambulatory Visit: Payer: Medicare HMO | Attending: Internal Medicine

## 2020-02-29 DIAGNOSIS — Z23 Encounter for immunization: Secondary | ICD-10-CM | POA: Insufficient documentation

## 2020-02-29 NOTE — Progress Notes (Signed)
   Covid-19 Vaccination Clinic  Name:  Jose Wolfe    MRN: [id number] DOB: [birth year]  02/29/2020  [name] was observed post Covid-19 immunization for 15 minutes without incident. He was provided with Vaccine Information Sheet and instruction to access the V-Safe system.   [name] was instructed to call 911 with any severe reactions post vaccine: Marland Kitchen Difficulty breathing  . Swelling of face and throat  . A fast heartbeat  . A bad rash all over body  . Dizziness and weakness   Immunizations Administered    Name Date Dose VIS Date Route   Pfizer COVID-19 Vaccine 02/29/2020 10:06 AM 0.3 mL 12/08/2019 Intramuscular   Manufacturer: [place]   Lot: [id number]   [place]: [id number]

## 2020-03-19 DIAGNOSIS — J4530 Mild persistent asthma, uncomplicated: Secondary | ICD-10-CM | POA: Diagnosis not present

## 2020-03-19 DIAGNOSIS — E7800 Pure hypercholesterolemia, unspecified: Secondary | ICD-10-CM | POA: Diagnosis not present

## 2020-03-19 DIAGNOSIS — C61 Malignant neoplasm of prostate: Secondary | ICD-10-CM | POA: Diagnosis not present

## 2020-03-30 DIAGNOSIS — S81819A Laceration without foreign body, unspecified lower leg, initial encounter: Secondary | ICD-10-CM | POA: Diagnosis not present

## 2020-04-02 DIAGNOSIS — Z0100 Encounter for examination of eyes and vision without abnormal findings: Secondary | ICD-10-CM | POA: Diagnosis not present

## 2020-04-23 DIAGNOSIS — C61 Malignant neoplasm of prostate: Secondary | ICD-10-CM | POA: Diagnosis not present

## 2020-05-01 DIAGNOSIS — C775 Secondary and unspecified malignant neoplasm of intrapelvic lymph nodes: Secondary | ICD-10-CM | POA: Diagnosis not present

## 2020-05-01 DIAGNOSIS — C61 Malignant neoplasm of prostate: Secondary | ICD-10-CM | POA: Diagnosis not present

## 2020-05-01 DIAGNOSIS — Z5111 Encounter for antineoplastic chemotherapy: Secondary | ICD-10-CM | POA: Diagnosis not present

## 2020-06-13 DIAGNOSIS — E7800 Pure hypercholesterolemia, unspecified: Secondary | ICD-10-CM | POA: Diagnosis not present

## 2020-06-13 DIAGNOSIS — Z131 Encounter for screening for diabetes mellitus: Secondary | ICD-10-CM | POA: Diagnosis not present

## 2020-06-17 DIAGNOSIS — C61 Malignant neoplasm of prostate: Secondary | ICD-10-CM | POA: Diagnosis not present

## 2020-06-17 DIAGNOSIS — E7800 Pure hypercholesterolemia, unspecified: Secondary | ICD-10-CM | POA: Diagnosis not present

## 2020-06-17 DIAGNOSIS — J4530 Mild persistent asthma, uncomplicated: Secondary | ICD-10-CM | POA: Diagnosis not present

## 2020-06-19 DIAGNOSIS — L309 Dermatitis, unspecified: Secondary | ICD-10-CM | POA: Diagnosis not present

## 2020-06-19 DIAGNOSIS — Z0000 Encounter for general adult medical examination without abnormal findings: Secondary | ICD-10-CM | POA: Diagnosis not present

## 2020-06-19 DIAGNOSIS — J4530 Mild persistent asthma, uncomplicated: Secondary | ICD-10-CM | POA: Diagnosis not present

## 2020-06-19 DIAGNOSIS — Z23 Encounter for immunization: Secondary | ICD-10-CM | POA: Diagnosis not present

## 2020-06-19 DIAGNOSIS — Z131 Encounter for screening for diabetes mellitus: Secondary | ICD-10-CM | POA: Diagnosis not present

## 2020-06-19 DIAGNOSIS — E7800 Pure hypercholesterolemia, unspecified: Secondary | ICD-10-CM | POA: Diagnosis not present

## 2020-09-16 DIAGNOSIS — C61 Malignant neoplasm of prostate: Secondary | ICD-10-CM | POA: Diagnosis not present

## 2020-09-17 DIAGNOSIS — E7800 Pure hypercholesterolemia, unspecified: Secondary | ICD-10-CM | POA: Diagnosis not present

## 2020-09-17 DIAGNOSIS — J4530 Mild persistent asthma, uncomplicated: Secondary | ICD-10-CM | POA: Diagnosis not present

## 2020-09-17 DIAGNOSIS — C61 Malignant neoplasm of prostate: Secondary | ICD-10-CM | POA: Diagnosis not present

## 2020-09-18 DIAGNOSIS — H35033 Hypertensive retinopathy, bilateral: Secondary | ICD-10-CM | POA: Diagnosis not present

## 2020-09-18 DIAGNOSIS — Z961 Presence of intraocular lens: Secondary | ICD-10-CM | POA: Diagnosis not present

## 2020-09-18 DIAGNOSIS — H40013 Open angle with borderline findings, low risk, bilateral: Secondary | ICD-10-CM | POA: Diagnosis not present

## 2020-09-23 DIAGNOSIS — R9721 Rising PSA following treatment for malignant neoplasm of prostate: Secondary | ICD-10-CM | POA: Diagnosis not present

## 2020-09-23 DIAGNOSIS — C61 Malignant neoplasm of prostate: Secondary | ICD-10-CM | POA: Diagnosis not present

## 2020-11-07 DIAGNOSIS — C61 Malignant neoplasm of prostate: Secondary | ICD-10-CM | POA: Diagnosis not present

## 2020-11-18 DIAGNOSIS — Z5111 Encounter for antineoplastic chemotherapy: Secondary | ICD-10-CM | POA: Diagnosis not present

## 2020-11-18 DIAGNOSIS — R69 Illness, unspecified: Secondary | ICD-10-CM | POA: Diagnosis not present

## 2020-11-18 DIAGNOSIS — C61 Malignant neoplasm of prostate: Secondary | ICD-10-CM | POA: Diagnosis not present

## 2020-12-17 DIAGNOSIS — C61 Malignant neoplasm of prostate: Secondary | ICD-10-CM | POA: Diagnosis not present

## 2020-12-17 DIAGNOSIS — E7800 Pure hypercholesterolemia, unspecified: Secondary | ICD-10-CM | POA: Diagnosis not present

## 2020-12-17 DIAGNOSIS — J4530 Mild persistent asthma, uncomplicated: Secondary | ICD-10-CM | POA: Diagnosis not present

## 2021-02-09 DIAGNOSIS — C61 Malignant neoplasm of prostate: Secondary | ICD-10-CM | POA: Diagnosis not present

## 2021-02-09 DIAGNOSIS — J4530 Mild persistent asthma, uncomplicated: Secondary | ICD-10-CM | POA: Diagnosis not present

## 2021-02-09 DIAGNOSIS — Z20822 Contact with and (suspected) exposure to covid-19: Secondary | ICD-10-CM | POA: Diagnosis not present

## 2021-02-09 DIAGNOSIS — E7800 Pure hypercholesterolemia, unspecified: Secondary | ICD-10-CM | POA: Diagnosis not present

## 2021-03-07 DIAGNOSIS — C61 Malignant neoplasm of prostate: Secondary | ICD-10-CM | POA: Diagnosis not present

## 2021-03-12 DIAGNOSIS — C61 Malignant neoplasm of prostate: Secondary | ICD-10-CM | POA: Diagnosis not present

## 2021-03-24 DIAGNOSIS — E7800 Pure hypercholesterolemia, unspecified: Secondary | ICD-10-CM | POA: Diagnosis not present

## 2021-03-24 DIAGNOSIS — J4530 Mild persistent asthma, uncomplicated: Secondary | ICD-10-CM | POA: Diagnosis not present

## 2021-03-24 DIAGNOSIS — C61 Malignant neoplasm of prostate: Secondary | ICD-10-CM | POA: Diagnosis not present

## 2021-04-02 ENCOUNTER — Other Ambulatory Visit (HOSPITAL_COMMUNITY): Payer: Self-pay | Admitting: Urology

## 2021-04-02 DIAGNOSIS — C61 Malignant neoplasm of prostate: Secondary | ICD-10-CM

## 2021-04-16 ENCOUNTER — Encounter (HOSPITAL_COMMUNITY): Payer: Self-pay

## 2021-04-16 ENCOUNTER — Ambulatory Visit (HOSPITAL_COMMUNITY): Admission: RE | Admit: 2021-04-16 | Payer: Medicare HMO | Source: Ambulatory Visit

## 2021-04-24 ENCOUNTER — Other Ambulatory Visit: Payer: Self-pay

## 2021-04-24 ENCOUNTER — Ambulatory Visit (HOSPITAL_COMMUNITY)
Admission: RE | Admit: 2021-04-24 | Discharge: 2021-04-24 | Disposition: A | Discharge status: Home / Self Care | Payer: Medicare HMO | Source: Ambulatory Visit | Attending: Urology | Admitting: Urology

## 2021-04-24 DIAGNOSIS — Z192 Hormone resistant malignancy status: Secondary | ICD-10-CM | POA: Diagnosis not present

## 2021-04-24 DIAGNOSIS — C61 Malignant neoplasm of prostate: Secondary | ICD-10-CM | POA: Insufficient documentation

## 2021-04-24 MED ORDER — PIFLIFOLASTAT F 18 (PYLARIFY) INJECTION
9.0000 | Freq: Once | INTRAVENOUS | Status: AC
  Administered 2021-04-24: 10 via INTRAVENOUS

## 2021-04-30 NOTE — Progress Notes (Signed)
Order(s) created erroneously. Erroneous order ID: [id number]  Order moved by: SHEALY, DEBRA D  Order move date/time: 04/30/2021 12:15 PM  Source Patient: [id number]  Source Contact: 04/28/2021  Destination Patient: [id number]  Destination Contact: 08/15/2020 

## 2021-05-15 ENCOUNTER — Encounter: Payer: Self-pay | Admitting: Urology

## 2021-05-15 NOTE — Progress Notes (Signed)
Patients meaningful use is complete . Patient is aware that this is a my chart visit. Patient is aware that the nursing portion is complete and that he  Needs to log on to my chart at 10:00 to speak with ashley.

## 2021-05-15 NOTE — Progress Notes (Addendum)
Patient denies any dysuria or hematuria. Patient reports nocturia x 2. Patient reports some leakage. Denies any urgency. Patient states that he empties his bladder with urination. Patient reports a strong stream.Patient denies having to push or strain to start his stream.Patient states that he void without  having to pause.Patient denies any issues with his bowels. Patient states that he has an appointment with his urologist on 05/21/21.

## 2021-05-16 ENCOUNTER — Ambulatory Visit
Admission: RE | Admit: 2021-05-16 | Discharge: 2021-05-16 | Disposition: A | Discharge status: Home / Self Care | Payer: Medicare HMO | Source: Ambulatory Visit | Attending: Urology | Admitting: Urology

## 2021-05-16 DIAGNOSIS — E7800 Pure hypercholesterolemia, unspecified: Secondary | ICD-10-CM | POA: Diagnosis not present

## 2021-05-16 DIAGNOSIS — C61 Malignant neoplasm of prostate: Secondary | ICD-10-CM | POA: Diagnosis not present

## 2021-05-16 DIAGNOSIS — J4530 Mild persistent asthma, uncomplicated: Secondary | ICD-10-CM | POA: Diagnosis not present

## 2021-05-16 DIAGNOSIS — C775 Secondary and unspecified malignant neoplasm of intrapelvic lymph nodes: Secondary | ICD-10-CM | POA: Diagnosis not present

## 2021-05-16 NOTE — Progress Notes (Signed)
Radiation Oncology         (336) [phone] ________________________________  Outpatient Re-Consultation - Conducted via MyChart due to current COVID-19 concerns for limiting patient exposure  Name: Jose Wolfe MRN: [id number]  Date: 05/16/2021  DOB: 1946-08-16  XL:KGMW, Jose Luo, MD  Ardis Hughs, MD   REFERRING PHYSICIAN: Ardis Hughs, MD  DIAGNOSIS: 75 y.o. gentleman with recurrent Stage T4, N1 M0 castrate-resistant prostate cancer with oligometastatic disease in the right pelvic nodes just superior to the bladder.    ICD-10-CM   1. Metastatic castration-resistant adenocarcinoma of prostate [facility])  C61    Z19.2     HISTORY OF PRESENT ILLNESS: Jose Wolfe is a 75 y.o. male with a diagnosis of progressive, metastatic castrate-resistant prostate cancer. He was initially diagnosed with a T1c, Gleason 3+3 prostate cancer in 20% of one core  in 2011 with Dr. Risa Grill. He opted to undergo radioactive seed implant as his definitive treatment on 06/04/10 under the care and direction of Dr. Link Snuffer and Dr. Valere Dross. He initially had an excellent response but unfortunately developed PSA recurrent disease in 2013 with a PSA of 2.42 at that time. A repeat prostate biopsy and staging scans were negative.  His PSA continued to rise and was up to 20 in 07/2017. At that point, he was enrolled in the HERO trial involving a new drug, Relogulix. His PSA responded nicely and nadired at 1.49 while he was on the trial.  The trial concluded in September 2019 and his PSA was back up to 3.98 in December 2019 so he was placed on Lupron and bicalutamide at that time.   Due to rising PSA, restaging scans and cystoscopy were performed which showed bladder neck invasion as well as metastatic pelvic lymphadenopathy. He proceeded to TURBT in 11/2018 with final pathology confirming poorly differentiated prostatic adenocarcinoma, Gleason 4+5.   His PSA decreased to 1.8 following TURBT but had  increased back up to 5.09 by 03/16/19.  He was started on Erleada (apalutamide) on 03/22/2019 in addition to Lupron.  His PSA decreased to 2.49 and remained stable until 09/29/2019, when it was noted to be further increased at 4.13. CT A/P was performed on 10/04/2019 for disease restaging and showed a new 2.9 cm soft tissue mass in the left lower pelvis along the lateral aspect of the urinary bladder, consistent with metastatic disease (new as compared to prior CT A/P from 09/2018).  Fortunately, there were no other sites of metastatic disease noted.  We met with the patient on 10/17/19 to discuss treatment options and he subsequently completed a 2 week course of SBRT to the left bladder wall. He continued on Lupron and Erleada since that time and his PSA became undetectable following completion of radiation and remained undetectable until 08/2020 when his PSA again became detectable at 0.43 and has continued to rise. His PSA was 0.91 in 10/2020 and most recently, in 02/2021 was up to 4.28. He underwent PSMA scan on 04/24/21 showing a cluster of nodules/nodes in the right pelvis superior to the bladder, consistent with either local prostate cancer spread/recurrence versus prostate cancer nodal metastasis. There was no abnormal radiotracer activity with the prostate gland itself and no evidence of metastatic disease outside of the pelvis or in the skeleton.  He has been referred back to [place] today to discuss further salvage treatment options for the oligometastatic disease in the pelvis.  PREVIOUS RADIATION THERAPY: Yes  11/01/19 - 11/14/19:  The oligometastatic disease in the prostate/left lateral bladder  was treated to 50 Gy in 10 fxs of 5 Gy each.  06/04/2010: Prostate Brachytherapy- 145 Gy definitive therapy (Drs. Valere Dross and Risa Grill)  PAST MEDICAL HISTORY:  Past Medical History:  Diagnosis Date  . COPD (chronic obstructive pulmonary disease) ([place])   . Prostate cancer ([place])       PAST SURGICAL HISTORY: Past  Surgical History:  Procedure Laterality Date  . RADIOACTIVE SEED IMPLANT  2011  . TONSILLECTOMY    . TRANSURETHRAL RESECTION OF BLADDER TUMOR N/A 12/15/2018   Procedure: CYSTOSCOPY TRANSURETHRAL RESECTION OF BLADDER TUMOR (TURBT);  Surgeon: Irine Seal, MD;  Location: [facility];  Service: Urology;  Laterality: N/A;    FAMILY HISTORY:  Family History  Problem Relation Age of Onset  . Prostate cancer Brother   . Cancer Maternal Uncle        unknown type    SOCIAL HISTORY:  Social History   Socioeconomic History  . Marital status: Married    Spouse name: Not on file  . Number of children: 0  . Years of education: Not on file  . Highest education level: Not on file  Occupational History  . Not on file  Tobacco Use  . Smoking status: Never Smoker  . Smokeless tobacco: Never Used  Vaping Use  . Vaping Use: Never used  Substance and Sexual Activity  . Alcohol use: Yes    Alcohol/week: 1.0 standard drink    Types: 1 Glasses of wine per week    Comment: daily with dinner  . Drug use: No  . Sexual activity: Not Currently  Other Topics Concern  . Not on file  Social History Narrative  . Not on file   Social Determinants of Health   Financial Resource Strain: Not on file  Food Insecurity: Not on file  Transportation Needs: Not on file  Physical Activity: Not on file  Stress: Not on file  Social Connections: Not on file  Intimate Partner Violence: Not on file    ALLERGIES: Patient has no known allergies.  MEDICATIONS:  Current Outpatient Medications  Medication Sig Dispense Refill  . betamethasone dipropionate (DIPROLENE) 0.05 % cream Apply 1 application topically 2 (two) times daily as needed (for psoriasis).   3  . budesonide-formoterol (SYMBICORT) 160-4.5 MCG/ACT inhaler Inhale 2 puffs into the lungs 2 (two) times daily.     . calcium-vitamin D (OSCAL WITH D) 500-200 MG-UNIT tablet Take 1 tablet by mouth.    . ERLEADA 60 MG tablet     . leuprolide, 6 Month,  (LEUPROLIDE ACETATE, 6 MONTH,) 45 MG injection See admin instructions.    . pseudoephedrine-acetaminophen (TYLENOL SINUS) 30-500 MG TABS tablet Take 1 tablet by mouth every 4 (four) hours as needed.    . triamcinolone cream (KENALOG) 0.1 % Apply 1 application topically 2 (two) times daily as needed (for psoriasis).   2  . VENTOLIN HFA 108 (90 Base) MCG/ACT inhaler INHALE 1 PUFF BY MOUTH EVERY 4 HOURS AS NEEDED (Patient not taking: Reported on 05/15/2021)     No current facility-administered medications for this encounter.    REVIEW OF SYSTEMS:  On review of systems, the patient reports that he is doing well overall. He denies any chest pain, shortness of breath, cough, fevers, chills, night sweats, unintended weight changes. He denies any bowel disturbances, and denies abdominal pain, nausea or vomiting. He denies any new musculoskeletal or joint aches or pains. He reports nocturia x2 and rare leakage due to urinary urgency but specifically denies dysuria, gross hematuria,  straining to void, hesitancy or incomplete bladder emptying. A complete review of systems is obtained and is otherwise negative.    PHYSICAL EXAM:  Wt Readings from Last 3 Encounters:  11/01/19 191 lb 1.6 oz (86.7 kg)  10/17/19 182 lb 8 oz (82.8 kg)  12/15/18 194 lb (88 kg)   Temp Readings from Last 3 Encounters:  11/01/19 98.5 F (36.9 C) (Temporal)  12/15/18 97.8 F (36.6 C)  12/12/18 97.7 F (36.5 C) (Oral)   BP Readings from Last 3 Encounters:  11/01/19 122/65  12/15/18 (!) 147/79  12/12/18 127/71   Pulse Readings from Last 3 Encounters:  11/01/19 75  12/15/18 65  12/12/18 66    /10  In general this is a well appearing Caucasian gentleman in no acute distress. He's alert and oriented x4 and appropriate throughout the examination. Cardiopulmonary assessment is negative for acute distress and he exhibits normal effort.    KPS = 90  100 - Normal; no complaints; no evidence of disease. 90   - Able to carry  on normal activity; minor signs or symptoms of disease. 80   - Normal activity with effort; some signs or symptoms of disease. 18   - Cares for self; unable to carry on normal activity or to do active work. 60   - Requires occasional assistance, but is able to care for most of his personal needs. 50   - Requires considerable assistance and frequent medical care. 77   - Disabled; requires special care and assistance. 42   - Severely disabled; hospital admission is indicated although death not imminent. 36   - Very sick; hospital admission necessary; active supportive treatment necessary. 10   - Moribund; fatal processes progressing rapidly. 0     - Dead  Karnofsky DA, Abelmann [place], Craver LS and [place] [facility] [phone]) The use of the nitrogen mustards in the palliative treatment of carcinoma: with particular reference to bronchogenic carcinoma Cancer 1 634-56  LABORATORY DATA:  Lab Results  Component Value Date   WBC 7.3 12/12/2018   HGB 13.3 12/12/2018   HCT 42.1 12/12/2018   MCV 95.2 12/12/2018   PLT 289 12/12/2018   Lab Results  Component Value Date   NA 142 12/12/2018   K 3.9 12/12/2018   CL 105 12/12/2018   CO2 29 12/12/2018   Lab Results  Component Value Date   ALT 37 05/28/2010   AST 28 05/28/2010   ALKPHOS 56 05/28/2010   BILITOT 0.7 05/28/2010     RADIOGRAPHY: NM PET (F18-PYLARIFY) SKULL TO MID THIGH  Result Date: 04/24/2021 CLINICAL DATA:  Castration resistant prostate carcinoma. Rising PSA equal 4.3. Prior brachytherapy. Prior TURBT. EXAM: NUCLEAR MEDICINE PET SKULL BASE TO THIGH TECHNIQUE: 10.0 mCi F18 Piflufolastat (Pylarify) was injected intravenously. Full-ring PET imaging was performed from the skull base to thigh after the radiotracer. CT data was obtained and used for attenuation correction and anatomic localization. COMPARISON:  Bone scan 10/17/2018 CT 10/04/2019 FINDINGS: NECK No radiotracer activity in neck lymph nodes. Incidental CT finding: None CHEST No  radiotracer accumulation within mediastinal or hilar lymph nodes. No suspicious pulmonary nodules on the CT scan. Incidental CT finding: None ABDOMEN/PELVIS Prostate: No focal activity in the prostate gland. Multiple brachytherapy seeds noted. Superior to the RIGHT aspect of the bladder there are several rounded nodules/nodes with intense radiotracer activity. For example rounded node/nodule measuring 12 mm (image 214/CT series 4) with SUV max equal 8.6. Cluster of similar nodules slightly more craniad measuring approximately 1 cm  each with SUV max equal 14.3 (image 212). Lymph nodes: No periaortic radiotracer avid lymph nodes. Liver: No evidence of liver metastasis Incidental CT finding: Atherosclerotic calcification of the aorta. SKELETON No focal  activity to suggest skeletal metastasis. IMPRESSION: 1. Cluster of nodules/nodes in the RIGHT pelvis superior to the bladder consistent with either local prostate cancer spread/recurrence versus prostate cancer nodal metastasis. 2. No abnormal radiotracer activity within the prostate gland itself. 3. No evidence of metastatic disease outside the pelvis. 4. No evidence skeletal metastasis. Electronically Signed   By: Suzy Bouchard M.D.   On: 04/24/2021 17:10      IMPRESSION/PLAN:  This visit was conducted via MyChart to spare the patient unnecessary potential exposure in the healthcare setting during the current COVID-19 pandemic  1. 75 y.o. gentleman with Stage T4, N1 castrate-resistant prostate cancer  with oligometastatic disease in the right pelvic nodes just superior to the bladder..  Today, we talked to the patient about the findings and workup thus far. We discussed the natural history of metastatic castrate resistant prostatic adenocarcinoma and general treatment, highlighting the role of salvage radiotherapy in the management of oligometastatic disease. We discussed the available radiation techniques, and focused on the details and logistics of  delivery.  The recommendation is to proceed with a 2 week course of stereotactic body radiotherapy (SBRT) targeting the involved right pelvic nodes. We reviewed the anticipated acute and late sequelae associated with radiation in this setting. The patient was encouraged to ask questions that were answered to his satisfaction.  At the end of the conversation the patient is interested in moving forward with salvage SBRT to the involved nodes in the right pelvis.  He appears to have a good understanding of his disease and our recommendations and has provided verbal consent to proceed today.  We will share our discussion with Dr. Junious Silk and move forward with treatment planning accordingly.  He has an upcoming wedding anniversary on 06/06/2021 and would like to postpone treatment until the following week.  He is tentatively scheduled for CT simulation/treatment planning at 9 AM on 05/27/2021 in anticipation of beginning his treatments on Monday, 06/09/2021. Currently, the plan is to continue Erleada and Lupron.  We enjoyed meeting with him today and look forward to continuing to participate in his care.   Given current concerns for patient exposure during the COVID-19 pandemic, this encounter was conducted via video-enabled MyChart visit.The patient has given verbal consent for this type of encounter. The time spent during this encounter was 60 minutes including chart review, reviewing radiological studies, meeting face-to-face with the patient, entering orders and completing documentation. The attendants for this meeting include Tyler Pita MD, Ashlyn Bruning PA-C, and patient, Flavio Lindroth. During the encounter, Tyler Pita MD and Freeman Caldron PA-C were located at [facility] Radiation Oncology Department.  Patient, Carnelius Hammitt was located at home.    Nicholos Johns, PA-C    Tyler Pita, MD  [facility] Direct Dial: [phone]   Fax: [phone] [email or web address]  Skype  [email or web address]  This document serves as a record of services personally performed by Tyler Pita, MD and Freeman Caldron, PA-C. It was created on their behalf by Wilburn Mylar, a trained medical scribe. The creation of this record is based on the scribe's personal observations and the provider's statements to them. This document has been checked and approved by the attending provider.

## 2021-05-21 DIAGNOSIS — C61 Malignant neoplasm of prostate: Secondary | ICD-10-CM | POA: Diagnosis not present

## 2021-05-22 ENCOUNTER — Ambulatory Visit
Admission: RE | Admit: 2021-05-22 | Discharge: 2021-05-22 | Disposition: A | Discharge status: Home / Self Care | Payer: Medicare HMO | Source: Ambulatory Visit | Attending: Radiation Oncology | Admitting: Radiation Oncology

## 2021-05-22 ENCOUNTER — Other Ambulatory Visit: Payer: Self-pay

## 2021-05-22 DIAGNOSIS — C775 Secondary and unspecified malignant neoplasm of intrapelvic lymph nodes: Secondary | ICD-10-CM | POA: Insufficient documentation

## 2021-05-22 DIAGNOSIS — Z192 Hormone resistant malignancy status: Secondary | ICD-10-CM

## 2021-05-22 DIAGNOSIS — C61 Malignant neoplasm of prostate: Secondary | ICD-10-CM | POA: Insufficient documentation

## 2021-05-26 NOTE — Progress Notes (Signed)
  Radiation Oncology         (336) [phone] ________________________________  Name: Kyen Taite MRN: [id number]  Date: 05/22/2021  DOB: Jan 03, 1946  STEREOTACTIC BODY RADIOTHERAPY SIMULATION AND TREATMENT PLANNING NOTE    ICD-10-CM   1. Metastatic castration-resistant adenocarcinoma of prostate ([place])  C61    Z19.2     DIAGNOSIS:  75 y.o. gentleman with recurrent Stage T4, N1 M0 castrate-resistant prostate cancer with oligometastatic disease in the right pelvic nodes just superior to the bladder.  NARRATIVE:  The patient was brought to the [place].  Identity was confirmed.  All relevant records and images related to the planned course of therapy were reviewed.  The patient freely provided informed written consent to proceed with treatment after reviewing the details related to the planned course of therapy. The consent form was witnessed and verified by the simulation staff.  Then, the patient was set-up in a stable reproducible  supine position for radiation therapy.  A BodyFix immobilization pillow was fabricated for reproducible positioning.  Surface markings were placed.  The CT images were loaded into the planning software.  The gross target volumes (GTV) and planning target volumes (PTV) were delinieated, and avoidance structures were contoured.  Treatment planning then occurred.  The radiation prescription was entered and confirmed.  A total of two complex treatment devices were fabricated in the form of the BodyFix immobilization pillow and a neck accuform cushion.  I have requested : 3D Simulation  I have requested a DVH of the following structures: targets and all normal structures near the target including bladder, rectum and femoral heads as noted on the radiation plan to maintain doses in adherence with established limits  SPECIAL TREATMENT PROCEDURE:  The planned course of therapy using radiation constitutes a special treatment procedure. Special care is  required in the management of this patient for the following reasons. High dose per fraction requiring special monitoring for increased toxicities of treatment including daily imaging..  The special nature of the planned course of radiotherapy will require increased physician supervision and oversight to ensure patient's safety with optimal treatment outcomes.  PLAN:  The patient will receive 50 Gy in 10 fractions.  ________________________________  Sheral Apley Tammi Klippel, M.D.

## 2021-05-28 DIAGNOSIS — C775 Secondary and unspecified malignant neoplasm of intrapelvic lymph nodes: Secondary | ICD-10-CM | POA: Insufficient documentation

## 2021-05-28 DIAGNOSIS — C61 Malignant neoplasm of prostate: Secondary | ICD-10-CM | POA: Insufficient documentation

## 2021-06-09 ENCOUNTER — Ambulatory Visit: Payer: Medicare HMO

## 2021-06-10 ENCOUNTER — Ambulatory Visit: Payer: Medicare HMO

## 2021-06-11 ENCOUNTER — Ambulatory Visit: Payer: Medicare HMO

## 2021-06-12 ENCOUNTER — Other Ambulatory Visit: Payer: Self-pay

## 2021-06-12 ENCOUNTER — Ambulatory Visit: Payer: Medicare HMO

## 2021-06-12 ENCOUNTER — Ambulatory Visit
Admission: RE | Admit: 2021-06-12 | Discharge: 2021-06-12 | Disposition: A | Discharge status: Home / Self Care | Payer: Medicare HMO | Source: Ambulatory Visit | Attending: Radiation Oncology | Admitting: Radiation Oncology

## 2021-06-12 DIAGNOSIS — C775 Secondary and unspecified malignant neoplasm of intrapelvic lymph nodes: Secondary | ICD-10-CM | POA: Diagnosis not present

## 2021-06-12 DIAGNOSIS — C61 Malignant neoplasm of prostate: Secondary | ICD-10-CM | POA: Diagnosis not present

## 2021-06-13 ENCOUNTER — Ambulatory Visit
Admission: RE | Admit: 2021-06-13 | Discharge: 2021-06-13 | Disposition: A | Discharge status: Home / Self Care | Payer: Medicare HMO | Source: Ambulatory Visit | Attending: Radiation Oncology | Admitting: Radiation Oncology

## 2021-06-13 ENCOUNTER — Ambulatory Visit: Payer: Medicare HMO

## 2021-06-13 DIAGNOSIS — C61 Malignant neoplasm of prostate: Secondary | ICD-10-CM | POA: Diagnosis not present

## 2021-06-13 DIAGNOSIS — C775 Secondary and unspecified malignant neoplasm of intrapelvic lymph nodes: Secondary | ICD-10-CM | POA: Diagnosis not present

## 2021-06-16 ENCOUNTER — Ambulatory Visit
Admission: RE | Admit: 2021-06-16 | Discharge: 2021-06-16 | Disposition: A | Discharge status: Home / Self Care | Payer: Medicare HMO | Source: Ambulatory Visit | Attending: Radiation Oncology | Admitting: Radiation Oncology

## 2021-06-16 ENCOUNTER — Other Ambulatory Visit: Payer: Self-pay

## 2021-06-16 DIAGNOSIS — C775 Secondary and unspecified malignant neoplasm of intrapelvic lymph nodes: Secondary | ICD-10-CM | POA: Diagnosis not present

## 2021-06-16 DIAGNOSIS — C61 Malignant neoplasm of prostate: Secondary | ICD-10-CM | POA: Diagnosis not present

## 2021-06-17 ENCOUNTER — Ambulatory Visit
Admission: RE | Admit: 2021-06-17 | Discharge: 2021-06-17 | Disposition: A | Discharge status: Home / Self Care | Payer: Medicare HMO | Source: Ambulatory Visit | Attending: Radiation Oncology | Admitting: Radiation Oncology

## 2021-06-17 DIAGNOSIS — C61 Malignant neoplasm of prostate: Secondary | ICD-10-CM | POA: Diagnosis not present

## 2021-06-17 DIAGNOSIS — C775 Secondary and unspecified malignant neoplasm of intrapelvic lymph nodes: Secondary | ICD-10-CM | POA: Diagnosis not present

## 2021-06-18 ENCOUNTER — Ambulatory Visit
Admission: RE | Admit: 2021-06-18 | Discharge: 2021-06-18 | Disposition: A | Discharge status: Home / Self Care | Payer: Medicare HMO | Source: Ambulatory Visit | Attending: Radiation Oncology | Admitting: Radiation Oncology

## 2021-06-18 ENCOUNTER — Other Ambulatory Visit: Payer: Self-pay

## 2021-06-18 DIAGNOSIS — C61 Malignant neoplasm of prostate: Secondary | ICD-10-CM | POA: Diagnosis not present

## 2021-06-18 DIAGNOSIS — C775 Secondary and unspecified malignant neoplasm of intrapelvic lymph nodes: Secondary | ICD-10-CM | POA: Diagnosis not present

## 2021-06-19 ENCOUNTER — Ambulatory Visit
Admission: RE | Admit: 2021-06-19 | Discharge: 2021-06-19 | Disposition: A | Discharge status: Home / Self Care | Payer: Medicare HMO | Source: Ambulatory Visit | Attending: Radiation Oncology | Admitting: Radiation Oncology

## 2021-06-19 DIAGNOSIS — C61 Malignant neoplasm of prostate: Secondary | ICD-10-CM | POA: Diagnosis not present

## 2021-06-19 DIAGNOSIS — C775 Secondary and unspecified malignant neoplasm of intrapelvic lymph nodes: Secondary | ICD-10-CM | POA: Diagnosis not present

## 2021-06-20 ENCOUNTER — Other Ambulatory Visit: Payer: Self-pay

## 2021-06-20 ENCOUNTER — Ambulatory Visit
Admission: RE | Admit: 2021-06-20 | Discharge: 2021-06-20 | Disposition: A | Discharge status: Home / Self Care | Payer: Medicare HMO | Source: Ambulatory Visit | Attending: Radiation Oncology | Admitting: Radiation Oncology

## 2021-06-20 DIAGNOSIS — C775 Secondary and unspecified malignant neoplasm of intrapelvic lymph nodes: Secondary | ICD-10-CM | POA: Diagnosis not present

## 2021-06-20 DIAGNOSIS — C61 Malignant neoplasm of prostate: Secondary | ICD-10-CM | POA: Diagnosis not present

## 2021-06-23 ENCOUNTER — Ambulatory Visit
Admission: RE | Admit: 2021-06-23 | Discharge: 2021-06-23 | Disposition: A | Discharge status: Home / Self Care | Payer: Medicare HMO | Source: Ambulatory Visit | Attending: Radiation Oncology | Admitting: Radiation Oncology

## 2021-06-23 ENCOUNTER — Other Ambulatory Visit: Payer: Self-pay

## 2021-06-23 DIAGNOSIS — C61 Malignant neoplasm of prostate: Secondary | ICD-10-CM | POA: Diagnosis not present

## 2021-06-23 DIAGNOSIS — C775 Secondary and unspecified malignant neoplasm of intrapelvic lymph nodes: Secondary | ICD-10-CM | POA: Diagnosis not present

## 2021-06-24 ENCOUNTER — Ambulatory Visit
Admission: RE | Admit: 2021-06-24 | Discharge: 2021-06-24 | Disposition: A | Discharge status: Home / Self Care | Payer: Medicare HMO | Source: Ambulatory Visit | Attending: Radiation Oncology | Admitting: Radiation Oncology

## 2021-06-24 DIAGNOSIS — C61 Malignant neoplasm of prostate: Secondary | ICD-10-CM | POA: Diagnosis not present

## 2021-06-24 DIAGNOSIS — C775 Secondary and unspecified malignant neoplasm of intrapelvic lymph nodes: Secondary | ICD-10-CM | POA: Diagnosis not present

## 2021-06-25 ENCOUNTER — Ambulatory Visit
Admission: RE | Admit: 2021-06-25 | Discharge: 2021-06-25 | Disposition: A | Discharge status: Home / Self Care | Payer: Medicare HMO | Source: Ambulatory Visit | Attending: Radiation Oncology | Admitting: Radiation Oncology

## 2021-06-25 ENCOUNTER — Encounter: Payer: Self-pay | Admitting: Urology

## 2021-06-25 ENCOUNTER — Other Ambulatory Visit: Payer: Self-pay

## 2021-06-25 DIAGNOSIS — C61 Malignant neoplasm of prostate: Secondary | ICD-10-CM

## 2021-06-25 DIAGNOSIS — C775 Secondary and unspecified malignant neoplasm of intrapelvic lymph nodes: Secondary | ICD-10-CM | POA: Diagnosis not present

## 2021-06-26 ENCOUNTER — Ambulatory Visit: Payer: Medicare HMO

## 2021-06-27 ENCOUNTER — Ambulatory Visit: Payer: Medicare HMO

## 2021-07-09 DIAGNOSIS — Z79899 Other long term (current) drug therapy: Secondary | ICD-10-CM | POA: Diagnosis not present

## 2021-07-09 DIAGNOSIS — Z23 Encounter for immunization: Secondary | ICD-10-CM | POA: Diagnosis not present

## 2021-07-09 DIAGNOSIS — I700 Atherosclerosis of aorta: Secondary | ICD-10-CM | POA: Diagnosis not present

## 2021-07-09 DIAGNOSIS — R251 Tremor, unspecified: Secondary | ICD-10-CM | POA: Diagnosis not present

## 2021-07-09 DIAGNOSIS — E7800 Pure hypercholesterolemia, unspecified: Secondary | ICD-10-CM | POA: Diagnosis not present

## 2021-07-09 DIAGNOSIS — Z1159 Encounter for screening for other viral diseases: Secondary | ICD-10-CM | POA: Diagnosis not present

## 2021-07-09 DIAGNOSIS — J4530 Mild persistent asthma, uncomplicated: Secondary | ICD-10-CM | POA: Diagnosis not present

## 2021-07-09 DIAGNOSIS — Z0000 Encounter for general adult medical examination without abnormal findings: Secondary | ICD-10-CM | POA: Diagnosis not present

## 2021-07-18 DIAGNOSIS — E7800 Pure hypercholesterolemia, unspecified: Secondary | ICD-10-CM | POA: Diagnosis not present

## 2021-07-18 DIAGNOSIS — J4530 Mild persistent asthma, uncomplicated: Secondary | ICD-10-CM | POA: Diagnosis not present

## 2021-07-18 DIAGNOSIS — C61 Malignant neoplasm of prostate: Secondary | ICD-10-CM | POA: Diagnosis not present

## 2021-08-06 NOTE — Progress Notes (Signed)
  Radiation Oncology         [phone] ________________________________  Name: Jose Wolfe MRN: [id number]  Date: 06/25/2021  DOB: 01-07-1946  End of Treatment Note  Diagnosis:   75 y.o. gentleman with recurrent Stage T4, N1 M0 castrate-resistant prostate cancer with oligometastatic disease in the right pelvic nodes just superior to the bladder.     Indication for treatment:  Curative, Definitive SBRT       Radiation treatment dates:   06/12/21 - 06/25/21  Site/dose:   The involved right pelvic nodes and nodal basin were treated to 50 Gy in 10 fractions of 5 Gy  Beams/energy:   The patient was treated using stereotactic body radiotherapy according to a 3D conformal radiotherapy plan.  Volumetric arc fields were employed to deliver 6 MV X-rays.  Image guidance was performed with per fraction cone beam CT prior to treatment under personal MD supervision.  Immobilization was achieved using BodyFix Pillow.  Narrative: The patient tolerated radiation treatment relatively well without any ill side effects.  Plan: The patient has completed radiation treatment. The patient will return to radiation oncology clinic for routine followup in one month. I advised them to call or return sooner if they have any questions or concerns related to their recovery or treatment. ________________________________  Sheral Apley. Tammi Klippel, M.D.

## 2021-08-07 ENCOUNTER — Ambulatory Visit
Admission: RE | Admit: 2021-08-07 | Discharge: 2021-08-07 | Disposition: A | Discharge status: Home / Self Care | Payer: Medicare HMO | Source: Ambulatory Visit | Attending: Urology | Admitting: Urology

## 2021-08-07 DIAGNOSIS — C61 Malignant neoplasm of prostate: Secondary | ICD-10-CM

## 2021-08-07 DIAGNOSIS — Z192 Hormone resistant malignancy status: Secondary | ICD-10-CM

## 2021-08-07 NOTE — Progress Notes (Signed)
Patient states he is doing well overall. Denies pain, dysuria, hematuria, diarrhea, constipation, or skin changes. Patient is experiencing very mild fatigue, nocturia x2, with frequency and urgency. Patient reports a strong urine stream.  I-PSS score of 4.  Meaningful use questions complete and patient notified of his 10:30am telephone appointment and expressed understanding.

## 2021-08-07 NOTE — Progress Notes (Signed)
Radiation Oncology         ([phone]) [phone] ________________________________  Name: Jose Wolfe MRN: [id number]  Date: 08/07/2021  DOB: 1946-09-09  Post Treatment Note  CC: [name], MD  [name], MD  Diagnosis:   75 y.o. gentleman with recurrent Stage T4, N1 M0 castrate-resistant prostate cancer with oligometastatic disease in the right pelvic nodes just superior to the bladder.     Interval Since Last Radiation:  6 weeks   06/12/21 - 06/25/21: The involved right pelvic nodes and nodal basin were treated to 50 Gy in 10 fractions of 5 Gy  11/01/19 - 11/14/19:  The oligometastatic disease in the prostate/left lateral bladder was treated to 50 Gy in 10 fxs of 5 Gy each.   06/04/2010: Prostate Brachytherapy- 145 Gy definitive therapy (Drs. Valere Dross and Risa Grill)  Narrative:  I spoke with the patient to conduct his routine scheduled 1 month follow up visit via telephone to spare the patient unnecessary potential exposure in the healthcare setting during the current COVID-19 pandemic.  The patient was notified in advance and gave permission to proceed with this visit format.  He tolerated radiation treatment relatively well without any ill side effects.                              On review of systems, the patient states that he is doing well in general.  He specifically denies dysuria, gross hematuria, straining to void, weak stream, incomplete emptying or incontinence.  He has continued with mild fatigue as well as nocturia x2 per night and mildly increased frequency and urgency which are manageable.  He denies abdominal pain, nausea, vomiting, diarrhea or constipation.  He reports a healthy appetite and is maintaining his weight.  Overall, he is pleased with his progress to date.  ALLERGIES:  has No Known Allergies.  Meds: Current Outpatient Medications  Medication Sig Dispense Refill   abiraterone acetate (ZYTIGA) 250 MG tablet Take 1,000 mg by mouth daily.      betamethasone dipropionate (DIPROLENE) 0.05 % cream Apply 1 application topically 2 (two) times daily as needed (for psoriasis).   3   budesonide-formoterol (SYMBICORT) 160-4.5 MCG/ACT inhaler Inhale 2 puffs into the lungs 2 (two) times daily.      calcium-vitamin D (OSCAL WITH D) 500-200 MG-UNIT tablet Take 1 tablet by mouth.     leuprolide, 6 Month, (LEUPROLIDE ACETATE, 6 MONTH,) 45 MG injection See admin instructions.     predniSONE (DELTASONE) 5 MG tablet Take 5 mg by mouth daily.     pseudoephedrine-acetaminophen (TYLENOL SINUS) 30-500 MG TABS tablet Take 1 tablet by mouth every 4 (four) hours as needed.     triamcinolone cream (KENALOG) 0.1 % Apply 1 application topically 2 (two) times daily as needed (for psoriasis).   2   VENTOLIN HFA 108 (90 Base) MCG/ACT inhaler INHALE 1 PUFF BY MOUTH EVERY 4 HOURS AS NEEDED (Patient not taking: No sig reported)     No current facility-administered medications for this encounter.    Physical Findings:  vitals were not taken for this visit.  Pain Assessment Pain Score: 0-No pain/10 Unable to assess due to telephone follow up visit format.  Lab Findings: Lab Results  Component Value Date   WBC 7.3 12/12/2018   HGB 13.3 12/12/2018   HCT 42.1 12/12/2018   MCV 95.2 12/12/2018   PLT 289 12/12/2018     Radiographic Findings: No results found.  Impression/Plan: 1. 75 y.o. gentleman with recurrent Stage T4, N1 M0 castrate-resistant prostate cancer with oligometastatic disease in the right pelvic nodes just superior to the bladder.    He appears to have recovered well from the effects of his recent SBRT and is currently without complaints.  We discussed that while we are happy to continue to participate in his care if clinically indicated, at this point, we will plan to see him back on an as-needed basis.  He will continue in routine follow-up under the care and direction of Dr. Junious Silk for continued management of his systemic disease.  He is  scheduled for labs on 08/20/2021 and will see Dr. Junious Silk the following week on 08/27/2021, at which time, he will be due for his next Eligard ADT injection.  He was recently switched to [facility] which he has continued taking as prescribed and is tolerating well.  We enjoyed taking care of him and look forward to continuing to follow his progress via correspondence.    Nicholos Johns, PA-C

## 2021-08-20 DIAGNOSIS — Z192 Hormone resistant malignancy status: Secondary | ICD-10-CM | POA: Diagnosis not present

## 2021-08-27 DIAGNOSIS — Z192 Hormone resistant malignancy status: Secondary | ICD-10-CM | POA: Diagnosis not present

## 2021-08-27 DIAGNOSIS — C61 Malignant neoplasm of prostate: Secondary | ICD-10-CM | POA: Diagnosis not present

## 2021-08-27 DIAGNOSIS — C775 Secondary and unspecified malignant neoplasm of intrapelvic lymph nodes: Secondary | ICD-10-CM | POA: Diagnosis not present

## 2021-09-02 ENCOUNTER — Telehealth: Payer: Self-pay | Admitting: Oncology

## 2021-09-02 NOTE — Telephone Encounter (Signed)
Scheduled appt per 9/6 referral. Pt is aware of appt date and time.

## 2021-09-05 ENCOUNTER — Other Ambulatory Visit (HOSPITAL_COMMUNITY): Payer: Self-pay | Admitting: Urology

## 2021-09-05 DIAGNOSIS — C61 Malignant neoplasm of prostate: Secondary | ICD-10-CM

## 2021-09-05 DIAGNOSIS — R9721 Rising PSA following treatment for malignant neoplasm of prostate: Secondary | ICD-10-CM

## 2021-09-05 DIAGNOSIS — C775 Secondary and unspecified malignant neoplasm of intrapelvic lymph nodes: Secondary | ICD-10-CM

## 2021-09-05 DIAGNOSIS — Z192 Hormone resistant malignancy status: Secondary | ICD-10-CM

## 2021-09-10 ENCOUNTER — Ambulatory Visit: Payer: Medicare HMO | Admitting: Cardiovascular Disease

## 2021-09-10 ENCOUNTER — Encounter: Payer: Self-pay | Admitting: Cardiovascular Disease

## 2021-09-10 ENCOUNTER — Other Ambulatory Visit: Payer: Self-pay

## 2021-09-10 VITALS — BP 122/60 | HR 72 | Ht 72.0 in | Wt 173.4 lb

## 2021-09-10 DIAGNOSIS — J449 Chronic obstructive pulmonary disease, unspecified: Secondary | ICD-10-CM | POA: Diagnosis not present

## 2021-09-10 DIAGNOSIS — R011 Cardiac murmur, unspecified: Secondary | ICD-10-CM | POA: Diagnosis not present

## 2021-09-10 DIAGNOSIS — C61 Malignant neoplasm of prostate: Secondary | ICD-10-CM

## 2021-09-10 DIAGNOSIS — I700 Atherosclerosis of aorta: Secondary | ICD-10-CM | POA: Diagnosis not present

## 2021-09-10 DIAGNOSIS — Z192 Hormone resistant malignancy status: Secondary | ICD-10-CM

## 2021-09-10 MED ORDER — VENTOLIN HFA 108 (90 BASE) MCG/ACT IN AERS: INHALATION_SPRAY | RESPIRATORY_TRACT | 0 refills | Status: AC

## 2021-09-10 NOTE — Patient Instructions (Signed)
Medication Instructions:  No changes *If you need a refill on your cardiac medications before your next appointment, please call your pharmacy*   Lab Work: None ordered If you have labs (blood work) drawn today and your tests are completely normal, you will receive your results only by: [place] (if you have MyChart) OR A paper copy in the mail If you have any lab test that is abnormal or we need to change your treatment, we will call you to review the results.   Testing/Procedures: Your physician has requested that you have an exercise tolerance test. For further information please visit [email or web address]. Please also follow instruction sheet, as given. This will take place at [place], [address]. Do not drink or eat foods with caffeine for 24 hours before the test. (Chocolate, coffee, tea, or energy drinks) If you use an inhaler, bring it with you to the test. Do not smoke for 4 hours before the test. Wear comfortable shoes and clothing.  Your physician has requested that you have an echocardiogram. Echocardiography is a painless test that uses sound waves to create images of your heart. It provides your doctor with information about the size and shape of your heart and how well your heart's chambers and valves are working. You may receive an ultrasound enhancing agent through an IV if needed to better visualize your heart during the echo.This procedure takes approximately one hour. There are no restrictions for this procedure. This will take place at the [address], [address].     Follow-Up: At [facility], you and your health needs are our priority.  As part of our continuing mission to provide you with exceptional heart care, we have created designated Provider Care Teams.  These Care Teams include your primary Cardiologist (physician) and Advanced Practice Providers (APPs -  Physician Assistants and Nurse Practitioners) who all work together to provide you  with the care you need, when you need it.  We recommend signing up for the patient portal called "MyChart".  Sign up information is provided on this After Visit Summary.  MyChart is used to connect with patients for Virtual Visits (Telemedicine).  Patients are able to view lab/test results, encounter notes, upcoming appointments, etc.  Non-urgent messages can be sent to your provider as well.   To learn more about what you can do with MyChart, go to [email or web address].    Your next appointment:   12 month(s)  The format for your next appointment:   In Person  Provider:   You may see Dr. Sallyanne Kuster or one of the following Advanced Practice Providers on your designated Care Team:   Almyra Deforest, PA-C Fabian Sharp, PA-C or  Roby Lofts, [place]

## 2021-09-11 ENCOUNTER — Inpatient Hospital Stay: Payer: Medicare HMO | Attending: Oncology | Admitting: Oncology

## 2021-09-11 ENCOUNTER — Inpatient Hospital Stay: Payer: Medicare HMO

## 2021-09-11 VITALS — BP 128/68 | HR 61 | Temp 97.9°F | Resp 17 | Ht 72.0 in | Wt 173.0 lb

## 2021-09-11 DIAGNOSIS — Z192 Hormone resistant malignancy status: Secondary | ICD-10-CM

## 2021-09-11 DIAGNOSIS — C7989 Secondary malignant neoplasm of other specified sites: Secondary | ICD-10-CM | POA: Insufficient documentation

## 2021-09-11 DIAGNOSIS — C61 Malignant neoplasm of prostate: Secondary | ICD-10-CM | POA: Diagnosis not present

## 2021-09-11 DIAGNOSIS — C775 Secondary and unspecified malignant neoplasm of intrapelvic lymph nodes: Secondary | ICD-10-CM | POA: Diagnosis not present

## 2021-09-11 LAB — CBC WITH DIFFERENTIAL (CANCER CENTER ONLY)
Abs Immature Granulocytes: 0.04 10*3/uL (ref 0.00–0.07)
Basophils Absolute: 0.1 10*3/uL (ref 0.0–0.1)
Basophils Relative: 1 %
Eosinophils Absolute: 0.6 10*3/uL — ABNORMAL HIGH (ref 0.0–0.5)
Eosinophils Relative: 10 %
HCT: 33 % — ABNORMAL LOW (ref 39.0–52.0)
Hemoglobin: 10.9 g/dL — ABNORMAL LOW (ref 13.0–17.0)
Immature Granulocytes: 1 %
Lymphocytes Relative: 9 %
Lymphs Abs: 0.5 10*3/uL — ABNORMAL LOW (ref 0.7–4.0)
MCH: 30.1 pg (ref 26.0–34.0)
MCHC: 33 g/dL (ref 30.0–36.0)
MCV: 91.2 fL (ref 80.0–100.0)
Monocytes Absolute: 0.7 10*3/uL (ref 0.1–1.0)
Monocytes Relative: 12 %
Neutro Abs: 3.7 10*3/uL (ref 1.7–7.7)
Neutrophils Relative %: 67 %
Platelet Count: 404 10*3/uL — ABNORMAL HIGH (ref 150–400)
RBC: 3.62 MIL/uL — ABNORMAL LOW (ref 4.22–5.81)
RDW: 13.5 % (ref 11.5–15.5)
WBC Count: 5.6 10*3/uL (ref 4.0–10.5)
nRBC: 0 % (ref 0.0–0.2)

## 2021-09-11 LAB — CMP (CANCER CENTER ONLY)
ALT: 26 U/L (ref 0–44)
AST: 29 U/L (ref 15–41)
Albumin: 3.3 g/dL — ABNORMAL LOW (ref 3.5–5.0)
Alkaline Phosphatase: 148 U/L — ABNORMAL HIGH (ref 38–126)
Anion gap: 7 (ref 5–15)
BUN: 15 mg/dL (ref 8–23)
CO2: 29 mmol/L (ref 22–32)
Calcium: 9.6 mg/dL (ref 8.9–10.3)
Chloride: 102 mmol/L (ref 98–111)
Creatinine: 0.85 mg/dL (ref 0.61–1.24)
GFR, Estimated: >60 mL/min (ref >60)
Glucose, Bld: 94 mg/dL (ref 70–99)
Potassium: 3.9 mmol/L (ref 3.5–5.1)
Sodium: 138 mmol/L (ref 135–145)
Total Bilirubin: 0.3 mg/dL (ref 0.3–1.2)
Total Protein: 6.9 g/dL (ref 6.5–8.1)

## 2021-09-11 NOTE — Progress Notes (Signed)
Cardiology Office Note:    Date:  09/11/2021   ID:  Jose Wolfe, [place] 1946-05-17, MRN [id number]  PCP:  [name], MD   [place] Providers Cardiologist:  None     Referring MD: [name], MD   No chief complaint on file. Jose Wolfe is a 75 y.o. male who is being seen today for the evaluation of coronary calcification and aortic atherosclerosis incidentally noted on chest CT at the request of [name], MD.   History of Present Illness:    Jose Wolfe is a 75 y.o. male with a hx of prostate cancer that has been treated with radiation seed therapy, complicated by recurrent elevation in PSA and pelvic lesions requiring external beam radiation therapy, again complicated by rising PSA levels without clear sites of metastasis.  He is receiving androgen deprivation therapy and is recently started on treatment with Zytiga and prednisone as well.  He has undergone PET/CT evaluation and this has identified aortic arch atherosclerosis and coronary artery calcifications.  He does not have a known clinical history of coronary disease or structural heart problems.  He bears a diagnosis of COPD and has chronic shortness of breath NYHA functional class I or II.  His dyspnea usually responds to inhalers.  He very rarely needs her rescue inhaler and in fact just had to throw away his albuterol inhaler since it had expired before he finished it.  It occurs intermittently and at times he does quite well, for example he can saw logs in his backyard.  He has occasional cough and wheezing, but no hemoptysis.  He does not have chest pain at rest or with activity and really has no complaints of pain anywhere.  He denies palpitations, dizziness or syncope.  He does not have edema, orthopnea or PND.  He does not have hematuria but has occasional mild dysuria.  He denies incontinence.  He bears a diagnosis of elevated cholesterol levels and previously had  to stop treatment with atorvastatin due to muscle aches, but without therapy, his most recent LDL cholesterol was only 91, HDL 46, triglycerides 147.  He does not have diabetes mellitus.  He does not have high blood pressure.  He has never smoked, but does have a history of passive exposure to smoke in childhood.  He lived and worked in [place] for several years, but not in an area with taking the severe air pollution.  His mother had congestive heart failure, etiology uncertain.  His father had end-stage renal disease, no known cardiac problems.  He has been told that he had a murmur for many years.  He does not recall ever having an echocardiogram.  Past Medical History:  Diagnosis Date   COPD (chronic obstructive pulmonary disease) ([place])    Prostate cancer [facility])     Past Surgical History:  Procedure Laterality Date   RADIOACTIVE SEED IMPLANT  2011   TONSILLECTOMY     TRANSURETHRAL RESECTION OF BLADDER TUMOR N/A 12/15/2018   Procedure: CYSTOSCOPY TRANSURETHRAL RESECTION OF BLADDER TUMOR (TURBT);  Surgeon: Irine Seal, MD;  Location: [facility];  Service: Urology;  Laterality: N/A;    Current Medications: Current Meds  Medication Sig   abiraterone acetate (ZYTIGA) 250 MG tablet Take 1,000 mg by mouth daily.   B Complex Vitamins (VITAMIN B COMPLEX) TABS Take 1 tablet by mouth daily.   betamethasone dipropionate (DIPROLENE) 0.05 % cream Apply 1 application topically 2 (two) times daily as needed (for psoriasis).  budesonide-formoterol (SYMBICORT) 160-4.5 MCG/ACT inhaler Inhale 2 puffs into the lungs 2 (two) times daily.    calcium-vitamin D (OSCAL WITH D) 500-200 MG-UNIT tablet Take 1 tablet by mouth.   leuprolide, 6 Month, (LEUPROLIDE ACETATE, 6 MONTH,) 45 MG injection See admin instructions.   predniSONE (DELTASONE) 5 MG tablet Take 5 mg by mouth daily.   [DISCONTINUED] VENTOLIN HFA 108 (90 Base) MCG/ACT inhaler INHALE 1 PUFF BY MOUTH EVERY 4 HOURS AS NEEDED     Allergies:   Patient has  no known allergies.   Social History   Socioeconomic History   Marital status: Married    Spouse name: Not on file   Number of children: 0   Years of education: Not on file   Highest education level: Not on file  Occupational History   Not on file  Tobacco Use   Smoking status: Never   Smokeless tobacco: Never  Vaping Use   Vaping Use: Never used  Substance and Sexual Activity   Alcohol use: Yes    Alcohol/week: 1.0 standard drink    Types: 1 Glasses of wine per week    Comment: daily with dinner   Drug use: No   Sexual activity: Not Currently  Other Topics Concern   Not on file  Social History Narrative   Not on file   Social Determinants of Health   Financial Resource Strain: Not on file  Food Insecurity: Not on file  Transportation Needs: Not on file  Physical Activity: Not on file  Stress: Not on file  Social Connections: Not on file     Family History: The patient's family history includes Cancer in his maternal uncle; Prostate cancer in his brother.  ROS:   Please see the history of present illness.     All other systems reviewed and are negative.  EKGs/Labs/Other Studies Reviewed:    The following studies were reviewed today: CT chest and abdomen images from the PET/CT performed 04/24/2021.  This shows atherosclerotic calcification not only in the aortic arch, but also in the coronary arteries, particularly in the proximal LAD artery.  EKG:  EKG is  ordered today.  The ekg ordered today demonstrates normal sinus rhythm and right bundle branch block (chronicity unknown, no old tracings for comparison), no repolarization abnormalities, normal QT interval  Recent Labs: 09/11/2021: ALT 26; BUN 15; Creatinine 0.85; Hemoglobin 10.9; Platelet Count 404; Potassium 3.9; Sodium 138  Recent Lipid Panel No results found for: CHOL, TRIG, HDL, CHOLHDL, VLDL, LDLCALC, LDLDIRECT   Risk Assessment/Calculations:           Physical Exam:    VS:  BP 122/60 (BP  Location: Left Arm, Patient Position: Sitting, Cuff Size: Normal)   Pulse 72   Ht 6' (1.829 m)   Wt 173 lb 6.4 oz (78.7 kg)   SpO2 98%   BMI 23.52 kg/m     Wt Readings from Last 3 Encounters:  09/11/21 173 lb (78.5 kg)  09/10/21 173 lb 6.4 oz (78.7 kg)  11/01/19 191 lb 1.6 oz (86.7 kg)     GEN:  Well nourished, well developed in no acute distress HEENT: Normal NECK: No JVD; No carotid bruits LYMPHATICS: No lymphadenopathy CARDIAC: RRR, no diastolic murmurs, rubs, gallops there is a grade 2/6 systolic murmur that is heard very well at the right upper sternal border, but is also heard across the entire anterior precordium to the apex and beyond and to the anterior axillary line.  Hard to say, but it may  intensify slightly with a Valsalva maneuver. RESPIRATORY:  Clear to auscultation without rales, wheezing or rhonchi  ABDOMEN: Soft, non-tender, non-distended MUSCULOSKELETAL:  No edema; No deformity  SKIN: Warm and dry NEUROLOGIC:  Alert and oriented x 3 PSYCHIATRIC:  Normal affect   ASSESSMENT:    1. Aortic atherosclerosis ([place])   2. Murmur, cardiac   3. Chronic obstructive pulmonary disease, unspecified COPD type ([place])   4. Metastatic castration-resistant adenocarcinoma of prostate ([place])    PLAN:    In order of problems listed above:  Coronary and aortic atherosclerosis: The study performed is not gated and does not allow quantification of the amount of plaque.  He does not have symptoms of coronary insufficiency.  Recommend a plain treadmill stress test.  He has only minimally elevated LDL cholesterol.  I am not sure that statin therapy with possible recurrence of myalgia/myopathy is indicated, considering the fact that he appears to have progressive prostate cancer.  It is possible that this would limit his prognosis more than as yet asymptomatic coronary atherosclerosis.  We will reevaluate the need for lipid-lowering therapy based on findings of his additional  work-up. Murmur: This is a little peculiar and that it has features both of aortic ejection and mitral insufficiency.  I wonder whether he has prolapse of the posterior leaflet of the mitral valve with an anteriorly directed jet.  Schedule echocardiogram. COPD: Puzzling diagnosis in  a non-smoker.  I see no mention of emphysematous changes on the evaluation of his chest PET/CT.  I did refill his rescue albuterol inhaler and reminded him that he should never use long-acting bronchodilators such as Symbicort more than twice daily (as  prescribed). Prostate Ca: Biochemical evidence of progression despite absence of symptoms and despite aggressive treatment with antiandrogen therapy and Zytiga.  Exact location of metastatic disease is not yet identified.  He is scheduled to undergo another PET CT scan later this month.  Thankfully he is asymptomatic.  Nevertheless, this is probably his most serious illness and most likely to limit his lifespan.   Shared Decision Making/Informed Consent The risks [chest pain, shortness of breath, cardiac arrhythmias, dizziness, blood pressure fluctuations, myocardial infarction, stroke/transient ischemic attack, and life-threatening complications (estimated to be 1 in 10,000)], benefits (risk stratification, diagnosing coronary artery disease, treatment guidance) and alternatives of an exercise tolerance test were discussed in detail with Mr. Hodgkin and he agrees to proceed.    Medication Adjustments/Labs and Tests Ordered: Current medicines are reviewed at length with the patient today.  Concerns regarding medicines are outlined above.  Orders Placed This Encounter  Procedures   Exercise Tolerance Test   EKG 12-Lead   ECHOCARDIOGRAM COMPLETE   Meds ordered this encounter  Medications   VENTOLIN HFA 108 (90 Base) MCG/ACT inhaler    Sig: INHALE 1 PUFF BY MOUTH EVERY 4 HOURS AS NEEDED    Dispense:  1 each    Refill:  0    Patient Instructions  Medication  Instructions:  No changes *If you need a refill on your cardiac medications before your next appointment, please call your pharmacy*   Lab Work: None ordered If you have labs (blood work) drawn today and your tests are completely normal, you will receive your results only by: MyChart Message (if you have MyChart) OR A paper copy in the mail If you have any lab test that is abnormal or we need to change your treatment, we will call you to review the results.   Testing/Procedures: Your physician has requested  that you have an exercise tolerance test. For further information please visit [email or web address]. Please also follow instruction sheet, as given. This will take place at [facility], [address]. Do not drink or eat foods with caffeine for 24 hours before the test. (Chocolate, coffee, tea, or energy drinks) If you use an inhaler, bring it with you to the test. Do not smoke for 4 hours before the test. Wear comfortable shoes and clothing.  Your physician has requested that you have an echocardiogram. Echocardiography is a painless test that uses sound waves to create images of your heart. It provides your doctor with information about the size and shape of your heart and how well your heart's chambers and valves are working. You may receive an ultrasound enhancing agent through an IV if needed to better visualize your heart during the echo.This procedure takes approximately one hour. There are no restrictions for this procedure. This will take place at the [address].     Follow-Up: At [facility], you and your health needs are our priority.  As part of our continuing mission to provide you with exceptional heart care, we have created designated Provider Care Teams.  These Care Teams include your primary Cardiologist (physician) and Advanced Practice Providers (APPs -  Physician Assistants and Nurse Practitioners) who all work together to provide you with the  care you need, when you need it.  We recommend signing up for the patient portal called "MyChart".  Sign up information is provided on this After Visit Summary.  MyChart is used to connect with patients for Virtual Visits (Telemedicine).  Patients are able to view lab/test results, encounter notes, upcoming appointments, etc.  Non-urgent messages can be sent to your provider as well.   To learn more about what you can do with MyChart, go to [email or web address].    Your next appointment:   12 month(s)  The format for your next appointment:   In Person  Provider:   You may see Dr. Sallyanne Kuster or one of the following Advanced Practice Providers on your designated Care Team:   Almyra Deforest, PA-C Fabian Sharp, [place] or  Roby Lofts, PA-C    Signed, Sanda Klein, MD  09/11/2021 4:49 PM    [place]

## 2021-09-11 NOTE — Progress Notes (Signed)
Reason for the request:    Prostate cancer  HPI: I was asked by Dr. Junious Silk to evaluate Jose Wolfe for the evaluation of prostate cancer.  He is a 75 year old man diagnosed with prostate cancer 2011 after presenting with a Gleason score of 3+3 = 6 and PSA 7.85.  Hew as treated with brachytherapy and seed implants at that time.  His PSA started to rise in 2013 was up to 2.42.  His PSA was 20 in 2018.  He enrolled in the HERO trial and he had a initial PSA response in 2018.  In February 2020 was 2.63 and subsequently in March 2019 was 5.09.  He was was started on apalutamide with initial PSA decline to 2.49.  His PSA on October 2 was up again to 4.13.  CT scan of the abdomen and pelvis on 10/04/2019 showed a 2.9 soft tissue mass in the left lower pelvis along the lateral aspect of the bladder that is consistent with metastatic disease.  He had a cystoscopy back in December 2019 and found to have a bladder neck neoplasm that was a poorly differentiated carcinoma of the prostate.  He has received radiation therapy under the care of Dr. Tammi Klippel completing 2 weeks of SBRT to the left bladder and continued on Lupron and Erleada.  His PSA became undetectable in 2021 but started to rise after that.  It was 4.8 in March 2022 and subsequently a PSMA PET scan showed cluster of nodules and lymph nodes in the right pelvic superior to the bladder.  He had received radiation therapy subsequently under the care of Dr. Tammi Klippel between June 16 and June 29 for a total of 50 Gray in 10 fractions. His systemic therapy was switched to abiraterone in May 2022.  His PSA on August 20, 2021 was 60.1 with a castrate level testosterone.  His PSA was 4.28 in March 2022.   Clinically, he is asymptomatic From his disease.  He denies any bone pain or pathological fractures.  He denies any urinary complaints at this time.  Pulm status quality of life remains excellent.  He does not report any headaches, blurry vision, syncope or  seizures. Does not report any fevers, chills or sweats.  Does not report any cough, wheezing or hemoptysis.  Does not report any chest pain, palpitation, orthopnea or leg edema.  Does not report any nausea, vomiting or abdominal pain.  Does not report any constipation or diarrhea.  Does not report any skeletal complaints.    Does not report frequency, urgency or hematuria.  Does not report any skin rashes or lesions. Does not report any heat or cold intolerance.  Does not report any lymphadenopathy or petechiae.  Does not report any anxiety or depression.  Remaining review of systems is negative.     Past Medical History:  Diagnosis Date   COPD (chronic obstructive pulmonary disease) ([place])    Prostate cancer ([place])   :   Past Surgical History:  Procedure Laterality Date   RADIOACTIVE SEED IMPLANT  2011   TONSILLECTOMY     TRANSURETHRAL RESECTION OF BLADDER TUMOR N/A 12/15/2018   Procedure: CYSTOSCOPY TRANSURETHRAL RESECTION OF BLADDER TUMOR (TURBT);  Surgeon: Irine Seal, MD;  Location: [facility];  Service: Urology;  Laterality: N/A;  :   Current Outpatient Medications:    abiraterone acetate (ZYTIGA) 250 MG tablet, Take 1,000 mg by mouth daily., Disp: , Rfl:    B Complex Vitamins (VITAMIN B COMPLEX) TABS, Take 1 tablet by mouth daily., Disp: ,  Rfl:    betamethasone dipropionate (DIPROLENE) 0.05 % cream, Apply 1 application topically 2 (two) times daily as needed (for psoriasis). , Disp: , Rfl: 3   budesonide-formoterol (SYMBICORT) 160-4.5 MCG/ACT inhaler, Inhale 2 puffs into the lungs 2 (two) times daily. , Disp: , Rfl:    calcium-vitamin D (OSCAL WITH D) 500-200 MG-UNIT tablet, Take 1 tablet by mouth., Disp: , Rfl:    leuprolide, 6 Month, (LEUPROLIDE ACETATE, 6 MONTH,) 45 MG injection, See admin instructions., Disp: , Rfl:    predniSONE (DELTASONE) 5 MG tablet, Take 5 mg by mouth daily., Disp: , Rfl:    pseudoephedrine-acetaminophen (TYLENOL SINUS) 30-500 MG TABS tablet, Take 1 tablet by  mouth every 4 (four) hours as needed. (Patient not taking: Reported on 09/10/2021), Disp: , Rfl:    triamcinolone cream (KENALOG) 0.1 %, Apply 1 application topically 2 (two) times daily as needed (for psoriasis).  (Patient not taking: Reported on 09/10/2021), Disp: , Rfl: 2   VENTOLIN HFA 108 (90 Base) MCG/ACT inhaler, INHALE 1 PUFF BY MOUTH EVERY 4 HOURS AS NEEDED, Disp: 1 each, Rfl: 0:  No Known Allergies:   Family History  Problem Relation Age of Onset   Prostate cancer Brother    Cancer Maternal Uncle        unknown type  :   Social History   Socioeconomic History   Marital status: Married    Spouse name: Not on file   Number of children: 0   Years of education: Not on file   Highest education level: Not on file  Occupational History   Not on file  Tobacco Use   Smoking status: Never   Smokeless tobacco: Never  Vaping Use   Vaping Use: Never used  Substance and Sexual Activity   Alcohol use: Yes    Alcohol/week: 1.0 standard drink    Types: 1 Glasses of wine per week    Comment: daily with dinner   Drug use: No   Sexual activity: Not Currently  Other Topics Concern   Not on file  Social History Narrative   Not on file   Social Determinants of Health   Financial Resource Strain: Not on file  Food Insecurity: Not on file  Transportation Needs: Not on file  Physical Activity: Not on file  Stress: Not on file  Social Connections: Not on file  Intimate Partner Violence: Not on file  :  Pertinent items are noted in HPI.  Exam: Blood pressure 128/68, pulse 61, temperature 97.9 F (36.6 C), temperature source Oral, resp. rate 17, height 6' (1.829 m), weight 173 lb (78.5 kg), SpO2 98 %. ECOG 0 General appearance: alert and cooperative appeared without distress. Head: atraumatic without any abnormalities. Eyes: conjunctivae/corneas clear. PERRL.  Sclera anicteric. Throat: lips, mucosa, and tongue normal; without oral thrush or ulcers. Resp: clear to  auscultation bilaterally without rhonchi, wheezes or dullness to percussion. Cardio: regular rate and rhythm, S1, S2 normal, no murmur, click, rub or gallop GI: soft, non-tender; bowel sounds normal; no masses,  no organomegaly Skin: Skin color, texture, turgor normal. No rashes or lesions Lymph nodes: Cervical, supraclavicular, and axillary nodes normal. Neurologic: Grossly normal without any motor, sensory or deep tendon reflexes. Musculoskeletal: No joint deformity or effusion.    Assessment and Plan:   75 year old with:  1.  Castration-resistant advanced prostate cancer with lymphadenopathy confirmed in 2020.  His initial diagnosis was in 2011 with a Gleason score 3+3 equal 6.  He was treated initially with radiation therapy  and developed relapsed disease in 2018.  He had received androgen deprivation therapy initially with Relogulix.  He developed a castration resistant disease and was placed on apalutamide.  He developed progression of disease with disease into the bladder and subsequently pelvic lymph nodes received radiation on 2 separate occasions most recently in May 2022.  His most recent PSA is up to 70 despite being on abiraterone.  The natural course of this disease was reviewed at this time and treatment options were discussed.  He had appears to have progressed on androgen receptor pathway inhibitors initially with apalutamide and currently with abiraterone and a rapid rise in his PSA.  The initial step is to update his staging scans and he is scheduled for PSMA PET to be completed on September 23.  Treatment options moving forward were reviewed which include radium 2-3 if he has extensive bone disease, PARP inhibitor if he harbors the appropriate mutation and Taxotere chemotherapy especially if he has high-volume disease outside of the bone.  The rationale and risks and benefits of all these approaches were discussed at this time.  Complications associated with chemotherapy  specifically was reviewed including nausea, vomiting, myelosuppression, neutropenia and possible sepsis as well as the need for a Port-A-Cath insertion.  After discussion today, we will obtain guardant 360 analysis today to investigate for a an actionable mutation and will have a follow-up after his PET scan to discuss his treatment options.  All his questions were answered to his satisfaction.  He is agreeable with this plan.   2.  Follow-up: We will be in the next few weeks after completing his staging scans and laboratory testing to discuss his results and determine treatment approach.  60  minutes were dedicated to this visit. The time was spent on reviewing laboratory data, imaging studies, discussing treatment options, and answering questions regarding future plan.    A copy of this consult has been forwarded to the requesting physician.

## 2021-09-12 LAB — PROSTATE-SPECIFIC AG, SERUM (LABCORP): Prostate Specific Ag, Serum: 107 ng/mL — ABNORMAL HIGH (ref 0.0–4.0)

## 2021-09-17 DIAGNOSIS — J4530 Mild persistent asthma, uncomplicated: Secondary | ICD-10-CM | POA: Diagnosis not present

## 2021-09-17 DIAGNOSIS — H35033 Hypertensive retinopathy, bilateral: Secondary | ICD-10-CM | POA: Diagnosis not present

## 2021-09-17 DIAGNOSIS — E7800 Pure hypercholesterolemia, unspecified: Secondary | ICD-10-CM | POA: Diagnosis not present

## 2021-09-18 DIAGNOSIS — H524 Presbyopia: Secondary | ICD-10-CM | POA: Diagnosis not present

## 2021-09-18 DIAGNOSIS — C61 Malignant neoplasm of prostate: Secondary | ICD-10-CM | POA: Diagnosis not present

## 2021-09-18 DIAGNOSIS — Z961 Presence of intraocular lens: Secondary | ICD-10-CM | POA: Diagnosis not present

## 2021-09-18 DIAGNOSIS — H0102A Squamous blepharitis right eye, upper and lower eyelids: Secondary | ICD-10-CM | POA: Diagnosis not present

## 2021-09-18 DIAGNOSIS — D3132 Benign neoplasm of left choroid: Secondary | ICD-10-CM | POA: Diagnosis not present

## 2021-09-18 DIAGNOSIS — H35033 Hypertensive retinopathy, bilateral: Secondary | ICD-10-CM | POA: Diagnosis not present

## 2021-09-18 DIAGNOSIS — D3131 Benign neoplasm of right choroid: Secondary | ICD-10-CM | POA: Diagnosis not present

## 2021-09-18 DIAGNOSIS — H40013 Open angle with borderline findings, low risk, bilateral: Secondary | ICD-10-CM | POA: Diagnosis not present

## 2021-09-19 ENCOUNTER — Other Ambulatory Visit: Payer: Self-pay

## 2021-09-19 ENCOUNTER — Encounter (HOSPITAL_COMMUNITY)
Admission: RE | Admit: 2021-09-19 | Discharge: 2021-09-19 | Disposition: A | Discharge status: Home / Self Care | Payer: Medicare HMO | Source: Ambulatory Visit | Attending: Urology | Admitting: Urology

## 2021-09-19 DIAGNOSIS — Z192 Hormone resistant malignancy status: Secondary | ICD-10-CM | POA: Diagnosis not present

## 2021-09-19 DIAGNOSIS — C7951 Secondary malignant neoplasm of bone: Secondary | ICD-10-CM | POA: Diagnosis not present

## 2021-09-19 DIAGNOSIS — I700 Atherosclerosis of aorta: Secondary | ICD-10-CM | POA: Diagnosis not present

## 2021-09-19 DIAGNOSIS — C775 Secondary and unspecified malignant neoplasm of intrapelvic lymph nodes: Secondary | ICD-10-CM | POA: Insufficient documentation

## 2021-09-19 DIAGNOSIS — R9721 Rising PSA following treatment for malignant neoplasm of prostate: Secondary | ICD-10-CM | POA: Insufficient documentation

## 2021-09-19 DIAGNOSIS — R590 Localized enlarged lymph nodes: Secondary | ICD-10-CM | POA: Diagnosis not present

## 2021-09-19 DIAGNOSIS — C61 Malignant neoplasm of prostate: Secondary | ICD-10-CM

## 2021-09-19 MED ORDER — PIFLIFOLASTAT F 18 (PYLARIFY) INJECTION
9.0000 | Freq: Once | INTRAVENOUS | Status: AC
  Administered 2021-09-19: 8.74 via INTRAVENOUS

## 2021-09-23 ENCOUNTER — Inpatient Hospital Stay: Payer: Medicare HMO | Admitting: Oncology

## 2021-09-23 ENCOUNTER — Other Ambulatory Visit: Payer: Self-pay

## 2021-09-23 VITALS — BP 145/88 | HR 76 | Temp 98.3°F | Resp 18 | Ht 72.0 in | Wt 169.8 lb

## 2021-09-23 DIAGNOSIS — C61 Malignant neoplasm of prostate: Secondary | ICD-10-CM

## 2021-09-23 DIAGNOSIS — C775 Secondary and unspecified malignant neoplasm of intrapelvic lymph nodes: Secondary | ICD-10-CM | POA: Diagnosis not present

## 2021-09-23 DIAGNOSIS — Z192 Hormone resistant malignancy status: Secondary | ICD-10-CM | POA: Diagnosis not present

## 2021-09-23 DIAGNOSIS — C7989 Secondary malignant neoplasm of other specified sites: Secondary | ICD-10-CM | POA: Diagnosis not present

## 2021-09-23 MED ORDER — PROCHLORPERAZINE MALEATE 10 MG PO TABS: 10.0000 mg | ORAL_TABLET | Freq: Four times a day (QID) | ORAL | 0 refills | Status: DC | PRN

## 2021-09-23 MED ORDER — LIDOCAINE-PRILOCAINE 2.5-2.5 % EX CREA: 1.0000 "application " | TOPICAL_CREAM | CUTANEOUS | 0 refills | Status: AC | PRN

## 2021-09-23 NOTE — Progress Notes (Signed)
Hematology and Oncology Follow Up Visit  Jose Wolfe [id number] [birth year] 75 y.o. 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  He initially presented with a Gleason score of 6 in 2011.   Prior Therapy:  [facility] as treated with brachytherapy and seed implants in 2011.  His PSA started to rise in 2013 was up to 2.42.  His PSA was 20 in 2018.   He enrolled in the HERO trial and he had a initial PSA response in 2018.  In February 2020 was 2.63 and subsequently in March 2019 was 5.09.  He was was started on apalutamide with initial PSA decline to 2.49.  His PSA on October 2 was up again to 4.13.  CT scan of the abdomen and pelvis on 10/04/2019 showed a 2.9 soft tissue mass in the left lower pelvis along the lateral aspect of the bladder that is consistent with metastatic disease.     He has received radiation therapy under the care of Dr. Tammi Wolfe completing 2 weeks of SBRT to the left bladder and continued on Lupron and Erleada.     He had received radiation therapy subsequently under the care of Dr. Tammi Wolfe between June 16 and June 29 for a total of 50 Gray in 10 fractions. His systemic therapy was switched to abiraterone in May 2022.  His PSA on August 20, 2021 was 60.1 with a castrate level testosterone.  His PSA was 4.28 in March 2022.     Current therapy:   Abiraterone 1000 mg daily  Androgen deprivation therapy under the care of Dr. Junious Wolfe.  Interim History: Jose Wolfe returns today for a follow-up visit.  Since the last visit, he reports no major changes in his health.  He denies any nausea, vomiting or abdominal pain.  He denies any bone pain or pathological fractures.  He denies hospitalization or illnesses.  He did report some hematuria after his recent PET scan.      Medications: I have reviewed the  patient's current medications.  Current Outpatient Medications  Medication Sig Dispense Refill   abiraterone acetate (ZYTIGA) 250 MG tablet Take 1,000 mg by mouth daily.     B Complex Vitamins (VITAMIN B COMPLEX) TABS Take 1 tablet by mouth daily.     betamethasone dipropionate (DIPROLENE) 0.05 % cream Apply 1 application topically 2 (two) times daily as needed (for psoriasis).   3   budesonide-formoterol (SYMBICORT) 160-4.5 MCG/ACT inhaler Inhale 2 puffs into the lungs 2 (two) times daily.      calcium-vitamin D (OSCAL WITH D) 500-200 MG-UNIT tablet Take 1 tablet by mouth.     Cyanocobalamin (VITAMIN B 12 PO) Take by mouth daily.     leuprolide, 6 Month, (LEUPROLIDE ACETATE, 6 MONTH,) 45 MG injection See admin instructions.     predniSONE (DELTASONE) 5 MG tablet Take 5 mg by mouth daily.     pseudoephedrine-acetaminophen (TYLENOL SINUS) 30-500 MG TABS tablet Take 1 tablet by mouth every 4 (four) hours as needed.     triamcinolone cream (KENALOG) 0.1 % Apply 1 application topically 2 (two) times daily as needed (for psoriasis).  2   VENTOLIN HFA 108 (90 Base) MCG/ACT inhaler INHALE 1 PUFF BY MOUTH EVERY 4 HOURS AS NEEDED 1 each 0   No current facility-administered medications for this visit.     Allergies: No Known Allergies  Physical Exam: Blood pressure (!) 145/88, pulse 76, temperature 98.3 F (36.8 C), temperature source Oral, resp. rate 18, height 6' (1.829 m), weight 169 lb 12.8 oz (77 kg), SpO2 98 %.  ECOG: 1   General appearance: Comfortable appearing without any discomfort Head: Normocephalic without any trauma Oropharynx: Mucous membranes are moist and pink without any thrush or ulcers. Eyes: Pupils are equal and round reactive to light. Lymph nodes: No cervical, supraclavicular, inguinal or axillary lymphadenopathy.   Heart:regular rate and rhythm.  S1 and S2 without leg edema. Lung: Clear without any rhonchi or wheezes.  No dullness to percussion. Abdomin: Soft,  nontender, nondistended with good bowel sounds.  No hepatosplenomegaly. Musculoskeletal: No joint deformity or effusion.  Full range of motion noted. Neurological: No deficits noted on motor, sensory and deep tendon reflex exam. Skin: No petechial rash or dryness.  Appeared moist.     Lab Results: Lab Results  Component Value Date   WBC 5.6 09/11/2021   HGB 10.9 (L) 09/11/2021   HCT 33.0 (L) 09/11/2021   MCV 91.2 09/11/2021   PLT 404 (H) 09/11/2021     Chemistry      Component Value Date/Time   NA 138 09/11/2021 1201   K 3.9 09/11/2021 1201   CL 102 09/11/2021 1201   CO2 29 09/11/2021 1201   BUN 15 09/11/2021 1201   CREATININE 0.85 09/11/2021 1201      Component Value Date/Time   CALCIUM 9.6 09/11/2021 1201   ALKPHOS 148 (H) 09/11/2021 1201   AST 29 09/11/2021 1201   ALT 26 09/11/2021 1201   BILITOT 0.3 09/11/2021 1201     Results for Jose Wolfe, Jose Wolfe "Jose Wolfe" (MRN [id number]) as of 09/23/2021 15:27  Ref. Range 09/11/2021 12:01  Prostate Specific Ag, Serum Latest Ref Range: 0.0 - 4.0 ng/mL 107.0 (H)    Marked worsening of disease with signs of diffuse skeletal metastases some frankly sclerotic but most either nearly occult or occult by CT showing marked increased radiotracer accumulation at multiple sites as described.   Nodal disease in the pelvis with new lymph nodes showing increased radiotracer accumulation on the LEFT and RIGHT and with nodularity in the deep pelvis mixed with nodal disease raising the question of peritoneal disease.   Improvement of RIGHT pelvic soft tissue and lymph nodes above the urinary bladder.   Signs of brachytherapy as before.  Impression and Plan:   75 year old with:  1.  Advanced prostate cancer since 2020 and currently castration-resistant disease with bone and lymphadenopathy.  He is status post seed implants initially subsequently progressed on androgen receptor pathway inhibitors with apalutamide and  abiraterone.  His disease status was updated at this time including review of his laboratory data which showed a PSA of 107.  PET scan obtained on 09/19/2021 was personally reviewed and showed progression of disease with bone and lymphadenopathy.  Guardant 360 analysis is currently pending.  Treatment options moving forward were reviewed at this time Taxotere chemotherapy remains the most definitive option unless he has a actionable mutation and would be a candidate for a PARP inhibitor.  Complication associated with Taxotere chemotherapy were reviewed.  He is, neutropenia, sepsis, and others.  After discussion today, he is agreeable to proceed.  2.  IV access: Risks and benefits of a Port-A-Cath insertion was discussed.  Complications that include bleeding, thrombosis and infection.  He is agreeable to proceed.  3.  Antiemetics: Prescription for Compazine will be available for him.  4.  Androgen deprivation therapy: I  recommended this to be continued indefinitely.  He is currently receiving that under the care of Dr. Junious Wolfe.  5.  Bone directed therapy: He would be a candidate for Xgeva.  Complications including osteonecrosis of the jaw and hypocalcemia were reviewed.  We will ask him to obtain dental clearance before consideration.  6.  Follow-up: We will be in the near future for the start of chemotherapy.   30  minutes were spent on this encounter.  The time was dedicated to reviewing laboratory data, imaging studies, treatment choices and addressing complications related to cancer and cancer therapy.      Zola Button, MD 9/27/20223:22 PM

## 2021-09-23 NOTE — Progress Notes (Signed)
START ON PATHWAY REGIMEN - Prostate     A cycle is every 21 days:     Prednisone      Docetaxel   **Always confirm dose/schedule in your pharmacy ordering system**  Patient Characteristics: Adenocarcinoma, Recurrent/New Systemic Disease, Castration Resistant, M1, Prior Novel Hormonal Agent, No Molecular Alteration or Targeted Therapy Exhausted, No Prior Docetaxel Histology: Adenocarcinoma Therapeutic Status: Recurrent/New Systemic Disease  Intent of Therapy: Non-Curative / Palliative Intent, Discussed with Patient

## 2021-09-24 DIAGNOSIS — C7951 Secondary malignant neoplasm of bone: Secondary | ICD-10-CM | POA: Diagnosis not present

## 2021-09-24 DIAGNOSIS — C61 Malignant neoplasm of prostate: Secondary | ICD-10-CM | POA: Diagnosis not present

## 2021-09-24 DIAGNOSIS — C775 Secondary and unspecified malignant neoplasm of intrapelvic lymph nodes: Secondary | ICD-10-CM | POA: Diagnosis not present

## 2021-09-24 DIAGNOSIS — R3915 Urgency of urination: Secondary | ICD-10-CM | POA: Diagnosis not present

## 2021-09-24 DIAGNOSIS — R3121 Asymptomatic microscopic hematuria: Secondary | ICD-10-CM | POA: Diagnosis not present

## 2021-09-26 ENCOUNTER — Telehealth (HOSPITAL_COMMUNITY): Payer: Self-pay | Admitting: *Deleted

## 2021-09-26 NOTE — Telephone Encounter (Signed)
Close encounter 

## 2021-10-01 ENCOUNTER — Other Ambulatory Visit: Payer: Self-pay

## 2021-10-01 ENCOUNTER — Ambulatory Visit (HOSPITAL_COMMUNITY)
Admission: RE | Admit: 2021-10-01 | Discharge: 2021-10-01 | Disposition: A | Discharge status: Home / Self Care | Payer: Medicare HMO | Source: Ambulatory Visit | Attending: Cardiovascular Disease | Admitting: Cardiovascular Disease

## 2021-10-01 DIAGNOSIS — I700 Atherosclerosis of aorta: Secondary | ICD-10-CM | POA: Diagnosis not present

## 2021-10-01 LAB — EXERCISE TOLERANCE TEST
Angina Index: 1
Duke Treadmill Score: -2
Estimated workload: 4.5
Exercise duration (min): 1 min
Exercise duration (sec): 58 s
MPHR: 143 {beats}/min
Peak HR: 129 {beats}/min
Percent HR: 90 %
Rest HR: 77 {beats}/min
ST Depression (mm): 0 mm
ST Elevation (mm): 0 mm

## 2021-10-07 ENCOUNTER — Other Ambulatory Visit: Payer: Self-pay | Admitting: Radiology

## 2021-10-08 ENCOUNTER — Other Ambulatory Visit: Payer: Self-pay | Admitting: Radiology

## 2021-10-08 ENCOUNTER — Other Ambulatory Visit (HOSPITAL_COMMUNITY): Payer: Self-pay | Admitting: Physician Assistant

## 2021-10-09 ENCOUNTER — Ambulatory Visit (HOSPITAL_COMMUNITY)
Admission: RE | Admit: 2021-10-09 | Discharge: 2021-10-09 | Disposition: A | Discharge status: Home / Self Care | Payer: Medicare HMO | Source: Ambulatory Visit | Attending: Oncology | Admitting: Oncology

## 2021-10-09 ENCOUNTER — Other Ambulatory Visit: Payer: Self-pay

## 2021-10-09 ENCOUNTER — Ambulatory Visit (HOSPITAL_BASED_OUTPATIENT_CLINIC_OR_DEPARTMENT_OTHER): Payer: Medicare HMO

## 2021-10-09 ENCOUNTER — Other Ambulatory Visit: Payer: Self-pay | Admitting: Oncology

## 2021-10-09 DIAGNOSIS — Z79899 Other long term (current) drug therapy: Secondary | ICD-10-CM | POA: Diagnosis not present

## 2021-10-09 DIAGNOSIS — C7951 Secondary malignant neoplasm of bone: Secondary | ICD-10-CM | POA: Diagnosis not present

## 2021-10-09 DIAGNOSIS — C61 Malignant neoplasm of prostate: Secondary | ICD-10-CM | POA: Insufficient documentation

## 2021-10-09 DIAGNOSIS — R011 Cardiac murmur, unspecified: Secondary | ICD-10-CM

## 2021-10-09 DIAGNOSIS — C779 Secondary and unspecified malignant neoplasm of lymph node, unspecified: Secondary | ICD-10-CM | POA: Diagnosis not present

## 2021-10-09 DIAGNOSIS — Z452 Encounter for adjustment and management of vascular access device: Secondary | ICD-10-CM | POA: Diagnosis not present

## 2021-10-09 DIAGNOSIS — J449 Chronic obstructive pulmonary disease, unspecified: Secondary | ICD-10-CM | POA: Insufficient documentation

## 2021-10-09 HISTORY — PX: IR IMAGING GUIDED PORT INSERTION: IMG5740

## 2021-10-09 LAB — ECHOCARDIOGRAM COMPLETE
AR max vel: 3.15 cm2
AV Area VTI: 3.17 cm2
AV Area mean vel: 3.18 cm2
AV Mean grad: 6 mmHg
AV Peak grad: 11.3 mmHg
Ao pk vel: 1.68 m/s
Area-P 1/2: 3.03 cm2
MV M vel: 4.88 m/s
MV Peak grad: 95.3 mmHg
P 1/2 time: 380 msec
S' Lateral: 3.4 cm

## 2021-10-09 MED ORDER — LIDOCAINE-EPINEPHRINE 1 %-1:100000 IJ SOLN
INTRAMUSCULAR | Status: AC
  Administered 2021-10-09: 20 mL via SUBCUTANEOUS
  Filled 2021-10-09: qty 1

## 2021-10-09 MED ORDER — LIDOCAINE-EPINEPHRINE (PF) 1 %-1:200000 IJ SOLN
INTRAMUSCULAR | Status: DC | PRN
  Administered 2021-10-09: 20 mL

## 2021-10-09 MED ORDER — FENTANYL CITRATE (PF) 100 MCG/2ML IJ SOLN
INTRAMUSCULAR | Status: AC
  Filled 2021-10-09: qty 2

## 2021-10-09 MED ORDER — HEPARIN SOD (PORK) LOCK FLUSH 100 UNIT/ML IV SOLN
INTRAVENOUS | Status: AC
  Administered 2021-10-09: 5 [IU]
  Filled 2021-10-09: qty 5

## 2021-10-09 MED ORDER — SODIUM CHLORIDE 0.9 % IV SOLN: INTRAVENOUS | Status: DC

## 2021-10-09 MED ORDER — MIDAZOLAM HCL 2 MG/2ML IJ SOLN
INTRAMUSCULAR | Status: AC
  Filled 2021-10-09: qty 4

## 2021-10-09 MED ORDER — MIDAZOLAM HCL 2 MG/2ML IJ SOLN
INTRAMUSCULAR | Status: DC | PRN
  Administered 2021-10-09 (×3): 1 mg via INTRAVENOUS

## 2021-10-09 MED ORDER — HEPARIN SOD (PORK) LOCK FLUSH 100 UNIT/ML IV SOLN
INTRAVENOUS | Status: DC | PRN
  Administered 2021-10-09: 500 [IU] via INTRAVENOUS

## 2021-10-09 MED ORDER — FENTANYL CITRATE (PF) 100 MCG/2ML IJ SOLN
INTRAMUSCULAR | Status: DC | PRN
  Administered 2021-10-09: 50 ug via INTRAVENOUS

## 2021-10-09 NOTE — Procedures (Signed)
Pre Procedure Dx: Metastatic prostate cancer. Post Procedural Dx: Same  Successful placement of right IJ approach port-a-cath with tip at the superior caval atrial junction. The catheter is ready for immediate use.  Estimated Blood Loss: Minimal  Complications: None immediate.  Ronny Bacon, MD Pager #: [phone]

## 2021-10-09 NOTE — Consult Note (Signed)
Chief Complaint: Patient was seen in consultation today for port a cath placement  Referring Physician(s): Wyatt Portela  Supervising Physician: Sandi Mariscal  Patient Status: [place]  History of Present Illness: Jose Wolfe is a 75 y.o. male PMH COPD, castration-resistant advanced prostate cancer with disease to the bone and lymphadenopathy noted in 2020.  He initially presented with a Gleason score of 6 in 2011. He has undergone treatment but now has evidence of disease progression by imaging.  He is scheduled today for Port-A-Cath placement to assist with additional treatment.  Past Medical History:  Diagnosis Date   COPD (chronic obstructive pulmonary disease) ([place])    Prostate cancer [facility])     Past Surgical History:  Procedure Laterality Date   RADIOACTIVE SEED IMPLANT  2011   TONSILLECTOMY     TRANSURETHRAL RESECTION OF BLADDER TUMOR N/A 12/15/2018   Procedure: CYSTOSCOPY TRANSURETHRAL RESECTION OF BLADDER TUMOR (TURBT);  Surgeon: Irine Seal, MD;  Location: [facility];  Service: Urology;  Laterality: N/A;    Allergies: Patient has no known allergies.  Medications: Prior to Admission medications   Medication Sig Start Date End Date Taking? Authorizing Provider  abiraterone acetate (ZYTIGA) 250 MG tablet Take 1,000 mg by mouth daily. 06/02/21   [provider]  B Complex Vitamins (VITAMIN B COMPLEX) TABS Take 1 tablet by mouth daily.    [provider]  betamethasone dipropionate (DIPROLENE) 0.05 % cream Apply 1 application topically 2 (two) times daily as needed (for psoriasis).  10/28/18   [provider]  budesonide-formoterol (SYMBICORT) 160-4.5 MCG/ACT inhaler Inhale 2 puffs into the lungs 2 (two) times daily.     [provider]  calcium-vitamin D (OSCAL WITH D) 500-200 MG-UNIT tablet Take 1 tablet by mouth.    [provider]  Cyanocobalamin (VITAMIN B 12 PO) Take by mouth daily.    [provider]   leuprolide, 6 Month, (LEUPROLIDE ACETATE, 6 MONTH,) 45 MG injection See admin instructions.    [provider]  lidocaine-prilocaine (EMLA) cream Apply 1 application topically as needed. 09/23/21   Wyatt Portela, MD  predniSONE (DELTASONE) 5 MG tablet Take 5 mg by mouth daily. 06/02/21   [provider]  prochlorperazine (COMPAZINE) 10 MG tablet Take 1 tablet (10 mg total) by mouth every 6 (six) hours as needed for nausea or vomiting. 09/23/21   Wyatt Portela, MD  pseudoephedrine-acetaminophen (TYLENOL SINUS) 30-500 MG TABS tablet Take 1 tablet by mouth every 4 (four) hours as needed.    [provider]  triamcinolone cream (KENALOG) 0.1 % Apply 1 application topically 2 (two) times daily as needed (for psoriasis). 10/28/18   [provider]  VENTOLIN HFA 108 (90 Base) MCG/ACT inhaler INHALE 1 PUFF BY MOUTH EVERY 4 HOURS AS NEEDED 09/10/21   Croitoru, Dani Gobble, MD     Family History  Problem Relation Age of Onset   Prostate cancer Brother    Cancer Maternal Uncle        unknown type    Social History   Socioeconomic History   Marital status: Married    Spouse name: Not on file   Number of children: 0   Years of education: Not on file   Highest education level: Not on file  Occupational History   Not on file  Tobacco Use   Smoking status: Never   Smokeless tobacco: Never  Vaping Use   Vaping Use: Never used  Substance and Sexual Activity   Alcohol use:  Yes    Alcohol/week: 1.0 standard drink    Types: 1 Glasses of wine per week    Comment: daily with dinner   Drug use: No   Sexual activity: Not Currently  Other Topics Concern   Not on file  Social History Narrative   Not on file   Social Determinants of Health   Financial Resource Strain: Not on file  Food Insecurity: Not on file  Transportation Needs: Not on file  Physical Activity: Not on file  Stress: Not on file  Social Connections: Not on file      Review of Systems currently  denies fever, headache, chest pain, abdominal/back pain, nausea, vomiting or bleeding.  He does have occasional cough and some dyspnea with exertion.  Vital Signs: Blood pressure 143/81, HR  65, temp 98.3, respirations 18, O2 sat 100% room air    Physical Exam awake, alert.  Chest with distant breath sounds bilaterally.  Heart with regular rate and rhythm, soft murmur.  Abdomen soft, positive bowel sounds, nontender.  No significant lower extremity edema.  Imaging: Exercise Tolerance Test  Result Date: 10/01/2021   No ST deviation was noted.   A Bruce protocol stress test was performed. Exercise capacity was severely impaired. Stage 1 was reached after exercising for 1 min and 58 sec. Maximum HR of 129 bpm. MPHR 90.0 %. Peak METS 4.5 .  The patient experienced non-limiting angina during the test. The patient requested the test to be stopped. The patient reported dyspnea, leg pain and fatigue during the stress test. Normal blood pressure response noted during stress.   Prior study not available for comparison.   Duke Treadmill Score =-2 (Moderate Risk)   Poor exercise tolerance.   NM PET (PSMA) SKULL TO MID THIGH  Result Date: 09/22/2021 CLINICAL DATA:  Prostate cancer, castrate resistant prostate cancer post radiotherapy and systemic therapy with marked increase in PSA, latest PSA of 60.1. EXAM: NUCLEAR MEDICINE PET SKULL BASE TO THIGH TECHNIQUE: 8.74 mCi F18 Piflufolastat (Pylarify) was injected intravenously. Full-ring PET imaging was performed from the skull base to thigh after the radiotracer. CT data was obtained and used for attenuation correction and anatomic localization. COMPARISON:  Most recent exam for comparison is a PSMA PET from April of 2022. FINDINGS: NECK No radiotracer activity in neck lymph nodes. Incidental CT finding: None CHEST No radiotracer accumulation within mediastinal or hilar lymph nodes. No suspicious pulmonary nodules on the CT scan. Incidental CT finding: Aortic  atherosclerosis, generalized atherosclerotic plaque, no aneurysmal dilation. Normal caliber central pulmonary vessels. No pericardial effusion. Limited assessment of cardiovascular structures given lack of intravenous contrast. No adenopathy by size criteria in the chest. No effusion.  No consolidative process. ABDOMEN/PELVIS Prostate: Post brachytherapy/radiotherapy in the prostate. No signs of radiotracer accumulation in the prostate bed. Lymph nodes: LEFT pelvic sidewall lymph node (image 204/4) 12 mm with a maximum SUV of 15.7. RIGHT external iliac lymph node (image 207/4) 22 mm, maximum SUV of 16.2. RIGHT common iliac lymph node (image 184/4) 9 mm with a maximum SUV of 11.4. Above nodal disease in the pelvis new since previous imaging. Superior rectal lymph nodes and nodularity along the LEFT mesorectum (image 206/4) this measures 3.5 x 1.7 cm with a maximum SUV of 9.2, this is new compared to previous imaging and with smaller areas of nodularity tracking up the superior rectal vein. Improved appearance of areas of uptake in the RIGHT pelvis above the urinary bladder, not visible as discrete measurable disease on the current  study but with small areas of nodularity scattered throughout the pelvis in the 2-3 mm range, for instance small collection of nodularity seen on image 211/4) Liver: No evidence of liver metastasis Incidental CT finding: Post cholecystectomy. No acute findings in the abdomen or pelvis. Presumed cyst of the lower-interpolar RIGHT kidney. Brachytherapy seeds present in the prostate as before. Aortic atherosclerosis of the abdominal aorta. No aneurysmal dilation. SKELETON Widespread bony metastatic disease involving all visualized bones to some extent including bilateral humeri, proximal femora, bilateral ribs, bilateral scapulae, bilateral clavicles, bilateral pelvis and every level of the spine. For instance, L5 with diffuse radiotracer accumulation shows a maximum SUV of 13.4. Sternum,  proximal sternum of the sternal body with a maximum SUV of 13.3. Many of these areas are not particularly sclerotic very faintly sclerotic on the current study, for instance posterior LEFT sixth rib (image 95/4) maximum SUV of 10.4. Example of more sclerotic involvement is demonstrated at the L3 vertebral level along the LEFT lateral aspect of the vertebral body (image 163/4) this shows a maximum SUV of 8.4. Marrow space involvement is noted in LEFT proximal humerus (image 68/4) maximum SUV in this area 17.8 similar findings in RIGHT greater than LEFT proximal femur and in the RIGHT proximal humerus. IMPRESSION: Marked worsening of disease with signs of diffuse skeletal metastases some frankly sclerotic but most either nearly occult or occult by CT showing marked increased radiotracer accumulation at multiple sites as described. Nodal disease in the pelvis with new lymph nodes showing increased radiotracer accumulation on the LEFT and RIGHT and with nodularity in the deep pelvis mixed with nodal disease raising the question of peritoneal disease. Improvement of RIGHT pelvic soft tissue and lymph nodes above the urinary bladder. Signs of brachytherapy as before. Aortic Atherosclerosis (ICD10-I70.0). Electronically Signed   By: Zetta Bills M.D.   On: 09/22/2021 12:00    Labs:  CBC: Recent Labs    09/11/21 1201  WBC 5.6  HGB 10.9*  HCT 33.0*  PLT 404*    COAGS: No results for input(s): INR, APTT in the last 8760 hours.  BMP: Recent Labs    09/11/21 1201  NA 138  K 3.9  CL 102  CO2 29  GLUCOSE 94  BUN 15  CALCIUM 9.6  CREATININE 0.85  GFRNONAA >60    LIVER FUNCTION TESTS: Recent Labs    09/11/21 1201  BILITOT 0.3  AST 29  ALT 26  ALKPHOS 148*  PROT 6.9  ALBUMIN 3.3*    TUMOR MARKERS: No results for input(s): AFPTM, CEA, CA199, CHROMGRNA in the last 8760 hours.  Assessment and Plan: 75 y.o. male PMH COPD, castration-resistant advanced prostate cancer with disease to the  bone and lymphadenopathy noted in 2020.  He initially presented with a Gleason score of 6 in 2011. He has undergone treatment but now has evidence of disease progression by imaging.  He is scheduled today for Port-A-Cath placement to assist with additional treatment.Risks and benefits of image guided port-a-catheter placement was discussed with the patient including, but not limited to bleeding, infection, pneumothorax, or fibrin sheath development and need for additional procedures.  All of the patient's questions were answered, patient is agreeable to proceed. Consent signed and in chart.    Thank you for this interesting consult.  I greatly enjoyed meeting Abdulla Pooley and look forward to participating in their care.  A copy of this report was sent to the requesting provider on this date.  Electronically Signed: D. Rowe Robert, PA-C  10/09/2021, 12:38 PM   I spent a total of   25 minutes  in face to face in clinical consultation, greater than 50% of which was counseling/coordinating care for Port-A-Cath placement

## 2021-10-14 ENCOUNTER — Telehealth: Payer: Self-pay | Admitting: Oncology

## 2021-10-14 NOTE — Telephone Encounter (Signed)
Scheduled per los, patient has been called and notified of all upcoming appointments. 

## 2021-10-16 ENCOUNTER — Inpatient Hospital Stay: Payer: Medicare HMO | Attending: Oncology

## 2021-10-16 ENCOUNTER — Other Ambulatory Visit: Payer: Self-pay

## 2021-10-16 DIAGNOSIS — C7951 Secondary malignant neoplasm of bone: Secondary | ICD-10-CM | POA: Insufficient documentation

## 2021-10-16 DIAGNOSIS — C61 Malignant neoplasm of prostate: Secondary | ICD-10-CM | POA: Insufficient documentation

## 2021-10-16 DIAGNOSIS — Z5111 Encounter for antineoplastic chemotherapy: Secondary | ICD-10-CM | POA: Insufficient documentation

## 2021-10-16 DIAGNOSIS — Z5189 Encounter for other specified aftercare: Secondary | ICD-10-CM | POA: Insufficient documentation

## 2021-10-16 NOTE — Progress Notes (Signed)
Pharmacist Chemotherapy Monitoring - Initial Assessment    Anticipated start date: 10/17/21   The following has been reviewed per standard work regarding the patient's treatment regimen: The patient's diagnosis, treatment plan and drug doses, and organ/hematologic function Lab orders and baseline tests specific to treatment regimen  The treatment plan start date, drug sequencing, and pre-medications Prior authorization status  Patient's documented medication list, including drug-drug interaction screen and prescriptions for anti-emetics and supportive care specific to the treatment regimen The drug concentrations, fluid compatibility, administration routes, and timing of the medications to be used The patient's access for treatment and lifetime cumulative dose history, if applicable  The patient's medication allergies and previous infusion related reactions, if applicable   Changes made to treatment plan:  treatment plan date  Follow up needed:  Check w/ pt if he has stopped Zytiga.   Kennith Center, Pharm.D., CPP 10/16/2021@9 :58 AM

## 2021-10-17 ENCOUNTER — Inpatient Hospital Stay: Payer: Medicare HMO

## 2021-10-17 ENCOUNTER — Telehealth: Payer: Self-pay

## 2021-10-17 ENCOUNTER — Encounter: Payer: Self-pay | Admitting: Oncology

## 2021-10-17 VITALS — BP 150/79 | HR 75 | Temp 98.4°F | Resp 18

## 2021-10-17 DIAGNOSIS — C61 Malignant neoplasm of prostate: Secondary | ICD-10-CM | POA: Diagnosis present

## 2021-10-17 DIAGNOSIS — Z5189 Encounter for other specified aftercare: Secondary | ICD-10-CM | POA: Diagnosis not present

## 2021-10-17 DIAGNOSIS — C7951 Secondary malignant neoplasm of bone: Secondary | ICD-10-CM | POA: Diagnosis present

## 2021-10-17 DIAGNOSIS — Z95828 Presence of other vascular implants and grafts: Secondary | ICD-10-CM

## 2021-10-17 DIAGNOSIS — Z5111 Encounter for antineoplastic chemotherapy: Secondary | ICD-10-CM | POA: Diagnosis not present

## 2021-10-17 LAB — CMP (CANCER CENTER ONLY)
ALT: 19 U/L (ref 0–44)
AST: 31 U/L (ref 15–41)
Albumin: 3.3 g/dL — ABNORMAL LOW (ref 3.5–5.0)
Alkaline Phosphatase: 89 U/L (ref 38–126)
Anion gap: 11 (ref 5–15)
BUN: 19 mg/dL (ref 8–23)
CO2: 25 mmol/L (ref 22–32)
Calcium: 9 mg/dL (ref 8.9–10.3)
Chloride: 102 mmol/L (ref 98–111)
Creatinine: 0.89 mg/dL (ref 0.61–1.24)
GFR, Estimated: >60 mL/min (ref >60)
Glucose, Bld: 87 mg/dL (ref 70–99)
Potassium: 4.3 mmol/L (ref 3.5–5.1)
Sodium: 138 mmol/L (ref 135–145)
Total Bilirubin: 0.4 mg/dL (ref 0.3–1.2)
Total Protein: 6.9 g/dL (ref 6.5–8.1)

## 2021-10-17 LAB — CBC WITH DIFFERENTIAL (CANCER CENTER ONLY)
Abs Immature Granulocytes: 0.04 10*3/uL (ref 0.00–0.07)
Basophils Absolute: 0.1 10*3/uL (ref 0.0–0.1)
Basophils Relative: 1 %
Eosinophils Absolute: 0.8 10*3/uL — ABNORMAL HIGH (ref 0.0–0.5)
Eosinophils Relative: 15 %
HCT: 29.8 % — ABNORMAL LOW (ref 39.0–52.0)
Hemoglobin: 9.9 g/dL — ABNORMAL LOW (ref 13.0–17.0)
Immature Granulocytes: 1 %
Lymphocytes Relative: 10 %
Lymphs Abs: 0.6 10*3/uL — ABNORMAL LOW (ref 0.7–4.0)
MCH: 29 pg (ref 26.0–34.0)
MCHC: 33.2 g/dL (ref 30.0–36.0)
MCV: 87.4 fL (ref 80.0–100.0)
Monocytes Absolute: 0.8 10*3/uL (ref 0.1–1.0)
Monocytes Relative: 15 %
Neutro Abs: 3.3 10*3/uL (ref 1.7–7.7)
Neutrophils Relative %: 58 %
Platelet Count: 487 10*3/uL — ABNORMAL HIGH (ref 150–400)
RBC: 3.41 MIL/uL — ABNORMAL LOW (ref 4.22–5.81)
RDW: 14.6 % (ref 11.5–15.5)
WBC Count: 5.6 10*3/uL (ref 4.0–10.5)
nRBC: 0 % (ref 0.0–0.2)

## 2021-10-17 MED ORDER — SODIUM CHLORIDE 0.9% FLUSH
10.0000 mL | INTRAVENOUS | Status: DC | PRN
  Administered 2021-10-17: 10 mL

## 2021-10-17 MED ORDER — HEPARIN SOD (PORK) LOCK FLUSH 100 UNIT/ML IV SOLN
500.0000 [IU] | Freq: Once | INTRAVENOUS | Status: AC | PRN
Start: 2021-10-17 — End: 2021-10-17
  Administered 2021-10-17: 500 [IU]

## 2021-10-17 MED ORDER — SODIUM CHLORIDE 0.9% FLUSH
10.0000 mL | Freq: Once | INTRAVENOUS | Status: AC
  Administered 2021-10-17: 10 mL

## 2021-10-17 MED ORDER — SODIUM CHLORIDE 0.9 % IV SOLN
75.0000 mg/m2 | Freq: Once | INTRAVENOUS | Status: AC
  Administered 2021-10-17: 150 mg via INTRAVENOUS
  Filled 2021-10-17: qty 15

## 2021-10-17 MED ORDER — SODIUM CHLORIDE 0.9 % IV SOLN
10.0000 mg | Freq: Once | INTRAVENOUS | Status: AC
  Administered 2021-10-17: 10 mg via INTRAVENOUS
  Filled 2021-10-17: qty 10

## 2021-10-17 MED ORDER — SODIUM CHLORIDE 0.9 % IV SOLN: Freq: Once | INTRAVENOUS | Status: AC

## 2021-10-17 NOTE — Progress Notes (Signed)
Met w/ pt to introduce myself as his Arboriculturist.  Unfortunately there aren't any foundations offering copay assistance for his Dx and the type of ins he has.  I informed him of the [employer] and prostate grants, went over what they cover, gave him an expense sheet and the income requirement.  Pt exceeds the income so he doesn't qualify for the grants at this time.  He has my card for any questions or concerns he may have in the future.

## 2021-10-17 NOTE — Patient Instructions (Addendum)
[facility]COL[facility]   Discharge Instructions: Thank you for choosing [facility] to provide your oncology and hematology care.   If you have a lab appointment with the [facility], please go directly to the [facility] and check in at the registration area.   Wear comfortable clothing and clothing appropriate for easy access to any Portacath or PICC line.   We strive to give you quality time with your provider. You may need to reschedule your appointment if you arrive late (15 or more minutes).  Arriving late affects you and other patients whose appointments are after yours.  Also, if you miss three or more appointments without notifying the office, you may be dismissed from the clinic at the provider's discretion.      For prescription refill requests, have your pharmacy contact our office and allow 72 hours for refills to be completed.    Today you received the following chemotherapy and/or immunotherapy agents: Docetaxel (Taxotere).      To help prevent nausea and vomiting after your treatment, we encourage you to take your nausea medication as directed.  BELOW ARE SYMPTOMS THAT SHOULD BE REPORTED IMMEDIATELY: *FEVER GREATER THAN 100.4 F (38 C) OR HIGHER *CHILLS OR SWEATING *NAUSEA AND VOMITING THAT IS NOT CONTROLLED WITH YOUR NAUSEA MEDICATION *UNUSUAL SHORTNESS OF BREATH *UNUSUAL BRUISING OR BLEEDING *URINARY PROBLEMS (pain or burning when urinating, or frequent urination) *BOWEL PROBLEMS (unusual diarrhea, constipation, pain near the anus) TENDERNESS IN MOUTH AND THROAT WITH OR WITHOUT PRESENCE OF ULCERS (sore throat, sores in mouth, or a toothache) UNUSUAL RASH, SWELLING OR PAIN  UNUSUAL VAGINAL DISCHARGE OR ITCHING   Items with * indicate a potential emergency and should be followed up as soon as possible or go to the Emergency Department if any problems should occur.  Please show the CHEMOTHERAPY ALERT CARD or IMMUNOTHERAPY ALERT CARD  at check-in to the Emergency Department and triage nurse.  Should you have questions after your visit or need to cancel or reschedule your appointment, please contact [facility]  Dept: [phone]  and follow the prompts.  Office hours are 8:00 a.m. to 4:30 p.m. Monday - Friday. Please note that voicemails left after 4:00 p.m. may not be returned until the following business day.  We are closed weekends and major holidays. You have access to a nurse at all times for urgent questions. Please call the main number to the clinic Dept: [phone] and follow the prompts.   For any non-urgent questions, you may also contact your provider using MyChart. We now offer e-Visits for anyone 79 and older to request care online for non-urgent symptoms. For details visit [email or web address].   Also download the MyChart app! Go to the app store, search "MyChart", open the app, select [facility], and log in with your MyChart username and password.  Due to Covid, a mask is required upon entering the hospital/clinic. If you do not have a mask, one will be given to you upon arrival. For doctor visits, patients may have 1 support person aged 65 or older with them. For treatment visits, patients cannot have anyone with them due to current Covid guidelines and our immunocompromised population.    Docetaxel injection What is this medication? DOCETAXEL (doe se TAX el) is a chemotherapy drug. It targets fast dividing cells, like cancer cells, and causes these cells to die. This medicine is used to treat many types of cancers like breast cancer, certain stomach cancers, head  and neck cancer, lung cancer, and prostate cancer. This medicine may be used for other purposes; ask your health care provider or pharmacist if you have questions. COMMON BRAND NAME(S): Docefrez, Taxotere What should I tell my care team before I take this medication? They need to know if you have any of these  conditions: infection (especially a virus infection such as chickenpox, cold sores, or herpes) liver disease low blood counts, like low white cell, platelet, or red cell counts an unusual or allergic reaction to docetaxel, polysorbate 80, other chemotherapy agents, other medicines, foods, dyes, or preservatives pregnant or trying to get pregnant breast-feeding How should I use this medication? This drug is given as an infusion into a vein. It is administered in a hospital or clinic by a specially trained health care professional. Talk to your pediatrician regarding the use of this medicine in children. Special care may be needed. Overdosage: If you think you have taken too much of this medicine contact a poison control center or emergency room at once. NOTE: This medicine is only for you. Do not share this medicine with others. What if I miss a dose? It is important not to miss your dose. Call your doctor or health care professional if you are unable to keep an appointment. What may interact with this medication? Do not take this medicine with any of the following medications: live virus vaccines This medicine may also interact with the following medications: aprepitant certain antibiotics like erythromycin or clarithromycin certain antivirals for HIV or hepatitis certain medicines for fungal infections like fluconazole, itraconazole, ketoconazole, posaconazole, or voriconazole cimetidine ciprofloxacin conivaptan cyclosporine dronedarone fluvoxamine grapefruit juice imatinib verapamil This list may not describe all possible interactions. Give your health care provider a list of all the medicines, herbs, non-prescription drugs, or dietary supplements you use. Also tell them if you smoke, drink alcohol, or use illegal drugs. Some items may interact with your medicine. What should I watch for while using this medication? Your condition will be monitored carefully while you are receiving  this medicine. You will need important blood work done while you are taking this medicine. Call your doctor or health care professional for advice if you get a fever, chills or sore throat, or other symptoms of a cold or flu. Do not treat yourself. This drug decreases your body's ability to fight infections. Try to avoid being around people who are sick. Some products may contain alcohol. Ask your health care professional if this medicine contains alcohol. Be sure to tell all health care professionals you are taking this medicine. Certain medicines, like metronidazole and disulfiram, can cause an unpleasant reaction when taken with alcohol. The reaction includes flushing, headache, nausea, vomiting, sweating, and increased thirst. The reaction can last from 30 minutes to several hours. You may get drowsy or dizzy. Do not drive, use machinery, or do anything that needs mental alertness until you know how this medicine affects you. Do not stand or sit up quickly, especially if you are an older patient. This reduces the risk of dizzy or fainting spells. Alcohol may interfere with the effect of this medicine. Talk to your health care professional about your risk of cancer. You may be more at risk for certain types of cancer if you take this medicine. Do not become pregnant while taking this medicine or for 6 months after stopping it. Women should inform their doctor if they wish to become pregnant or think they might be pregnant. There is a potential for serious  side effects to an unborn child. Talk to your health care professional or pharmacist for more information. Do not breast-feed an infant while taking this medicine or for 1 week after stopping it. Males who get this medicine must use a condom during sex with females who can get pregnant. If you get a woman pregnant, the baby could have birth defects. The baby could die before they are born. You will need to continue wearing a condom for 3 months after  stopping the medicine. Tell your health care provider right away if your partner becomes pregnant while you are taking this medicine. This may interfere with the ability to father a child. You should talk to your doctor or health care professional if you are concerned about your fertility. What side effects may I notice from receiving this medication? Side effects that you should report to your doctor or health care professional as soon as possible: allergic reactions like skin rash, itching or hives, swelling of the face, lips, or tongue blurred vision breathing problems changes in vision low blood counts - This drug may decrease the number of white blood cells, red blood cells and platelets. You may be at increased risk for infections and bleeding. nausea and vomiting pain, redness or irritation at site where injected pain, tingling, numbness in the hands or feet redness, blistering, peeling, or loosening of the skin, including inside the mouth signs of decreased platelets or bleeding - bruising, pinpoint red spots on the skin, black, tarry stools, nosebleeds signs of decreased red blood cells - unusually weak or tired, fainting spells, lightheadedness signs of infection - fever or chills, cough, sore throat, pain or difficulty passing urine swelling of the ankle, feet, hands Side effects that usually do not require medical attention (report to your doctor or health care professional if they continue or are bothersome): constipation diarrhea fingernail or toenail changes hair loss loss of appetite mouth sores muscle pain This list may not describe all possible side effects. Call your doctor for medical advice about side effects. You may report side effects to FDA at 1-800-FDA-1088. Where should I keep my medication? This drug is given in a hospital or clinic and will not be stored at home. NOTE: This sheet is a summary. It may not cover all possible information. If you have questions  about this medicine, talk to your doctor, pharmacist, or health care provider.  2022 Elsevier/Gold Standard (2019-11-13 19:50:31)

## 2021-10-17 NOTE — Telephone Encounter (Signed)
Patient notified of Prior Authorization approval for Lidocaine-Prilocaine 2.5% Cream. Medication is approved through 01/15/2022. Patient's preferred Pharmacy notified.

## 2021-10-18 LAB — PROSTATE-SPECIFIC AG, SERUM (LABCORP): Prostate Specific Ag, Serum: 183 ng/mL — ABNORMAL HIGH (ref 0.0–4.0)

## 2021-10-20 ENCOUNTER — Other Ambulatory Visit: Payer: Self-pay

## 2021-10-20 ENCOUNTER — Inpatient Hospital Stay: Payer: Medicare HMO

## 2021-10-20 VITALS — BP 117/91 | HR 101 | Temp 97.9°F | Resp 17

## 2021-10-20 DIAGNOSIS — C7951 Secondary malignant neoplasm of bone: Secondary | ICD-10-CM | POA: Diagnosis not present

## 2021-10-20 DIAGNOSIS — Z192 Hormone resistant malignancy status: Secondary | ICD-10-CM

## 2021-10-20 DIAGNOSIS — C61 Malignant neoplasm of prostate: Secondary | ICD-10-CM

## 2021-10-20 DIAGNOSIS — Z5189 Encounter for other specified aftercare: Secondary | ICD-10-CM | POA: Diagnosis not present

## 2021-10-20 DIAGNOSIS — Z5111 Encounter for antineoplastic chemotherapy: Secondary | ICD-10-CM | POA: Diagnosis not present

## 2021-10-20 MED ORDER — PEGFILGRASTIM-CBQV 6 MG/0.6ML ~~LOC~~ SOSY
6.0000 mg | PREFILLED_SYRINGE | Freq: Once | SUBCUTANEOUS | Status: AC
  Administered 2021-10-20: 6 mg via SUBCUTANEOUS
  Filled 2021-10-20: qty 0.6

## 2021-10-20 NOTE — Patient Instructions (Signed)

## 2021-10-20 NOTE — Progress Notes (Signed)
Spoke with patient via telephone to introduce myself as the prostate nurse navigator and discussed my role.  No barriers to care identified at this time.  Patient is s/p D1C1 of Taxotere.  Patient denies any side effects, and is feeling great.  Will come for G-CSF support today.  I informed patient that I would introduce myself in person at his next chemotherapy appointment on 11/9.  No further needs at this time.

## 2021-10-22 ENCOUNTER — Encounter: Payer: Self-pay | Admitting: *Deleted

## 2021-10-22 DIAGNOSIS — R079 Chest pain, unspecified: Secondary | ICD-10-CM

## 2021-10-31 MED ORDER — METOPROLOL TARTRATE 50 MG PO TABS: ORAL_TABLET | ORAL | 0 refills | Status: DC

## 2021-11-04 MED FILL — Dexamethasone Sodium Phosphate Inj 100 MG/10ML: INTRAMUSCULAR | Qty: 1 | Status: AC

## 2021-11-05 ENCOUNTER — Inpatient Hospital Stay: Payer: Medicare HMO

## 2021-11-05 ENCOUNTER — Inpatient Hospital Stay: Payer: Medicare HMO | Attending: Oncology

## 2021-11-05 ENCOUNTER — Other Ambulatory Visit: Payer: Self-pay

## 2021-11-05 ENCOUNTER — Inpatient Hospital Stay: Payer: Medicare HMO | Admitting: Oncology

## 2021-11-05 VITALS — BP 150/88 | HR 76 | Temp 97.7°F | Resp 18 | Wt 160.5 lb

## 2021-11-05 DIAGNOSIS — Z5189 Encounter for other specified aftercare: Secondary | ICD-10-CM | POA: Diagnosis not present

## 2021-11-05 DIAGNOSIS — Z5111 Encounter for antineoplastic chemotherapy: Secondary | ICD-10-CM | POA: Diagnosis not present

## 2021-11-05 DIAGNOSIS — C61 Malignant neoplasm of prostate: Secondary | ICD-10-CM | POA: Diagnosis not present

## 2021-11-05 DIAGNOSIS — Z192 Hormone resistant malignancy status: Secondary | ICD-10-CM

## 2021-11-05 DIAGNOSIS — C7951 Secondary malignant neoplasm of bone: Secondary | ICD-10-CM | POA: Diagnosis present

## 2021-11-05 LAB — CMP (CANCER CENTER ONLY)
ALT: 21 U/L (ref 0–44)
AST: 29 U/L (ref 15–41)
Albumin: 3.5 g/dL (ref 3.5–5.0)
Alkaline Phosphatase: 85 U/L (ref 38–126)
Anion gap: 8 (ref 5–15)
BUN: 17 mg/dL (ref 8–23)
CO2: 28 mmol/L (ref 22–32)
Calcium: 9.4 mg/dL (ref 8.9–10.3)
Chloride: 101 mmol/L (ref 98–111)
Creatinine: 0.82 mg/dL (ref 0.61–1.24)
GFR, Estimated: >60 mL/min (ref >60)
Glucose, Bld: 87 mg/dL (ref 70–99)
Potassium: 4.3 mmol/L (ref 3.5–5.1)
Sodium: 137 mmol/L (ref 135–145)
Total Bilirubin: 0.3 mg/dL (ref 0.3–1.2)
Total Protein: 7 g/dL (ref 6.5–8.1)

## 2021-11-05 LAB — GUARDANT 360

## 2021-11-05 LAB — CBC WITH DIFFERENTIAL (CANCER CENTER ONLY)
Abs Immature Granulocytes: 0.03 10*3/uL (ref 0.00–0.07)
Basophils Absolute: 0.1 10*3/uL (ref 0.0–0.1)
Basophils Relative: 2 %
Eosinophils Absolute: 0.2 10*3/uL (ref 0.0–0.5)
Eosinophils Relative: 2 %
HCT: 28.7 % — ABNORMAL LOW (ref 39.0–52.0)
Hemoglobin: 9.7 g/dL — ABNORMAL LOW (ref 13.0–17.0)
Immature Granulocytes: 1 %
Lymphocytes Relative: 11 %
Lymphs Abs: 0.7 10*3/uL (ref 0.7–4.0)
MCH: 29 pg (ref 26.0–34.0)
MCHC: 33.8 g/dL (ref 30.0–36.0)
MCV: 85.9 fL (ref 80.0–100.0)
Monocytes Absolute: 0.8 10*3/uL (ref 0.1–1.0)
Monocytes Relative: 13 %
Neutro Abs: 4.4 10*3/uL (ref 1.7–7.7)
Neutrophils Relative %: 71 %
Platelet Count: 431 10*3/uL — ABNORMAL HIGH (ref 150–400)
RBC: 3.34 MIL/uL — ABNORMAL LOW (ref 4.22–5.81)
RDW: 16.4 % — ABNORMAL HIGH (ref 11.5–15.5)
WBC Count: 6.2 10*3/uL (ref 4.0–10.5)
nRBC: 0 % (ref 0.0–0.2)

## 2021-11-05 MED ORDER — HEPARIN SOD (PORK) LOCK FLUSH 100 UNIT/ML IV SOLN
500.0000 [IU] | Freq: Once | INTRAVENOUS | Status: AC | PRN
  Administered 2021-11-05: 500 [IU]

## 2021-11-05 MED ORDER — PROCHLORPERAZINE MALEATE 10 MG PO TABS: 10.0000 mg | ORAL_TABLET | Freq: Four times a day (QID) | ORAL | 3 refills | Status: AC | PRN

## 2021-11-05 MED ORDER — SODIUM CHLORIDE 0.9 % IV SOLN
75.0000 mg/m2 | Freq: Once | INTRAVENOUS | Status: AC
  Administered 2021-11-05: 150 mg via INTRAVENOUS
  Filled 2021-11-05: qty 15

## 2021-11-05 MED ORDER — SODIUM CHLORIDE 0.9% FLUSH
10.0000 mL | INTRAVENOUS | Status: DC | PRN
  Administered 2021-11-05: 10 mL

## 2021-11-05 MED ORDER — SODIUM CHLORIDE 0.9 % IV SOLN
10.0000 mg | Freq: Once | INTRAVENOUS | Status: AC
  Administered 2021-11-05: 10 mg via INTRAVENOUS
  Filled 2021-11-05: qty 10

## 2021-11-05 MED ORDER — SODIUM CHLORIDE 0.9 % IV SOLN: Freq: Once | INTRAVENOUS | Status: AC

## 2021-11-05 NOTE — Addendum Note (Signed)
Addended by: Wyatt Portela on: 11/05/2021 12:36 PM   Modules accepted: Orders

## 2021-11-05 NOTE — Progress Notes (Signed)
Hematology and Oncology Follow Up Visit  Jose Wolfe [id number] Oct 03, 1946 75 y.o. 11/05/2021 11:16 AM Jose Wolfe, Jose Wolfe, Jose Luo, MD   Principle Diagnosis: 75 year old man with advanced prostate cancer with bone and lymphadenopathy diagnosed in 2020.  He has castration-resistant after presenting in 2011 with a Gleason score of 6.   Prior Therapy:  [facility] as treated with brachytherapy and seed implants in 2011.  His PSA started to rise in 2013 was up to 2.42.  His PSA was 20 in 2018.   He enrolled in the HERO trial and he had a initial PSA response in 2018.  In February 2020 was 2.63 and subsequently in March 2019 was 5.09.  He was was started on apalutamide with initial PSA decline to 2.49.  His PSA on October 2 was up again to 4.13.  CT scan of the abdomen and pelvis on 10/04/2019 showed a 2.9 soft tissue mass in the left lower pelvis along the lateral aspect of the bladder that is consistent with metastatic disease.     He has received radiation therapy under the care of Dr. Tammi Klippel completing 2 weeks of SBRT to the left bladder and continued on Lupron and Erleada.     He had received radiation therapy subsequently under the care of Dr. Tammi Klippel between June 16 and June 29 for a total of 50 Gray in 10 fractions. His systemic therapy was switched to abiraterone in May 2022.  His PSA on August 20, 2021 was 60.1 with a castrate level testosterone.  His PSA was 4.28 in March 2022.   Abiraterone 1000 mg daily discontinued in October 2022 due to progression of disease.    Current therapy:   Taxotere chemotherapy 75 mg per metered squared started on October 17, 2021.  He is here for cycle 2 of therapy.  Androgen deprivation therapy under the care of Dr. Junious Silk.  Interim History: Mr. Grunder presents today for a follow-up evaluation.  Since the last visit, he received the first cycle of chemotherapy without any major complications.  He denies any nausea, vomiting or  abdominal pain.  He denies any worsening neuropathy or fatigue.  He does report decrease in his appetite and of lost some weight.  Overall performance status quality of life remains unchanged.      Medications: Updated on review. Current Outpatient Medications  Medication Sig Dispense Refill   abiraterone acetate (ZYTIGA) 250 MG tablet Take 1,000 mg by mouth daily.     B Complex Vitamins (VITAMIN B COMPLEX) TABS Take 1 tablet by mouth daily.     betamethasone dipropionate (DIPROLENE) 0.05 % cream Apply 1 application topically 2 (two) times daily as needed (for psoriasis).   3   budesonide-formoterol (SYMBICORT) 160-4.5 MCG/ACT inhaler Inhale 2 puffs into the lungs 2 (two) times daily.      calcium-vitamin D (OSCAL WITH D) 500-200 MG-UNIT tablet Take 1 tablet by mouth.     Cyanocobalamin (VITAMIN B 12 PO) Take by mouth daily.     leuprolide, 6 Month, (LEUPROLIDE ACETATE, 6 MONTH,) 45 MG injection See admin instructions.     lidocaine-prilocaine (EMLA) cream Apply 1 application topically as needed. 30 g 0   metoprolol tartrate (LOPRESSOR) 50 MG tablet Take one tablet two hours prior to the test 1 tablet 0   predniSONE (DELTASONE) 5 MG tablet Take 5 mg by mouth daily.     prochlorperazine (COMPAZINE) 10 MG tablet Take 1 tablet (10 mg total) by mouth every 6 (six) hours as needed  for nausea or vomiting. 30 tablet 0   pseudoephedrine-acetaminophen (TYLENOL SINUS) 30-500 MG TABS tablet Take 1 tablet by mouth every 4 (four) hours as needed.     triamcinolone cream (KENALOG) 0.1 % Apply 1 application topically 2 (two) times daily as needed (for psoriasis).  2   VENTOLIN HFA 108 (90 Base) MCG/ACT inhaler INHALE 1 PUFF BY MOUTH EVERY 4 HOURS AS NEEDED 1 each 0   No current facility-administered medications for this visit.     Allergies: No Known Allergies    Physical Exam: Blood pressure (!) 150/88, pulse 76, temperature 97.7 F (36.5 C), resp. rate 18, weight 160 lb 8 oz (72.8 kg), SpO2 100  %.   ECOG: 1    General appearance: Alert, awake without any distress. Head: Atraumatic without abnormalities Oropharynx: Without any thrush or ulcers. Eyes: No scleral icterus. Lymph nodes: No lymphadenopathy noted in the cervical, supraclavicular, or axillary nodes Heart:regular rate and rhythm, without any murmurs or gallops.   Lung: Clear to auscultation without any rhonchi, wheezes or dullness to percussion. Abdomin: Soft, nontender without any shifting dullness or ascites. Musculoskeletal: No clubbing or cyanosis. Neurological: No motor or sensory deficits. Skin: No rashes or lesions.     Lab Results: Lab Results  Component Value Date   WBC 5.6 10/17/2021   HGB 9.9 (L) 10/17/2021   HCT 29.8 (L) 10/17/2021   MCV 87.4 10/17/2021   PLT 487 (H) 10/17/2021     Chemistry      Component Value Date/Time   NA 138 10/17/2021 0758   K 4.3 10/17/2021 0758   CL 102 10/17/2021 0758   CO2 25 10/17/2021 0758   BUN 19 10/17/2021 0758   CREATININE 0.89 10/17/2021 0758      Component Value Date/Time   CALCIUM 9.0 10/17/2021 0758   ALKPHOS 89 10/17/2021 0758   AST 31 10/17/2021 0758   ALT 19 10/17/2021 0758   BILITOT 0.4 10/17/2021 0758      Results for Zwahlen, Jose Humble "Meda Coffee" (MRN [id number]) as of 11/05/2021 11:19  Ref. Range 10/17/2021 07:58  Prostate Specific Ag, Serum Latest Ref Range: 0.0 - 4.0 ng/mL 183.0 (H)      Impression and Plan:   75 year old with:  1.  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    He is currently on Taxotere chemotherapy and received the first cycle without any complications.  Risks and benefits of continuing this treatment were reviewed at this time.  Complications that include nausea, vomiting, myelosuppression, neutropenia and possible sepsis were reiterated.  The plan is to complete 10 cycles of therapy.  He is agreeable to proceed.  Laboratory data from today  reviewed and showed adequate hematological parameters.  2.  IV access: Port-A-Cath inserted without any issues.  This will continue to be in use with chemotherapy.  3.  Antiemetics: Compazine is available to him without any recent nausea or vomiting.  This will be refilled for him today.  4.  Androgen deprivation therapy: He will continue to receive that under the care of Dr. Junious Silk.  5.  Bone directed therapy: Delton See has been deferred till he obtains dental clearance.  For the time being we will continue to defer this option.  6.  Growth factor support: He will receive growth factor support after each cycle of chemotherapy given his risk of neutropenia and sepsis.  7.  Follow-up: In 3 weeks for the next cycle of therapy.   30  minutes were  spent on this encounter.  Time was dedicated to reviewing his disease status, laboratory data review and addressing complications related to cancer and cancer therapy.      Zola Button, MD 11/9/202211:16 AM

## 2021-11-05 NOTE — Patient Instructions (Signed)
[facility]  Discharge Instructions: °Thank you for choosing [facility] to provide your oncology and hematology care.  ° °If you have a lab appointment with the Cancer Center, please go directly to the Cancer Center and check in at the registration area. °  °Wear comfortable clothing and clothing appropriate for easy access to any Portacath or PICC line.  ° °We strive to give you quality time with your provider. You may need to reschedule your appointment if you arrive late (15 or more minutes).  Arriving late affects you and other patients whose appointments are after yours.  Also, if you miss three or more appointments without notifying the office, you may be dismissed from the clinic at the provider’s discretion.    °  °For prescription refill requests, have your pharmacy contact our office and allow 72 hours for refills to be completed.   ° °Today you received the following chemotherapy and/or immunotherapy agents Docetaxel    °  °To help prevent nausea and vomiting after your treatment, we encourage you to take your nausea medication as directed. ° °BELOW ARE SYMPTOMS THAT SHOULD BE REPORTED IMMEDIATELY: °*FEVER GREATER THAN 100.4 F (38 °C) OR HIGHER °*CHILLS OR SWEATING °*NAUSEA AND VOMITING THAT IS NOT CONTROLLED WITH YOUR NAUSEA MEDICATION °*UNUSUAL SHORTNESS OF BREATH °*UNUSUAL BRUISING OR BLEEDING °*URINARY PROBLEMS (pain or burning when urinating, or frequent urination) °*BOWEL PROBLEMS (unusual diarrhea, constipation, pain near the anus) °TENDERNESS IN MOUTH AND THROAT WITH OR WITHOUT PRESENCE OF ULCERS (sore throat, sores in mouth, or a toothache) °UNUSUAL RASH, SWELLING OR PAIN  °UNUSUAL VAGINAL DISCHARGE OR ITCHING  ° °Items with * indicate a potential emergency and should be followed up as soon as possible or go to the Emergency Department if any problems should occur. ° °Please show the CHEMOTHERAPY ALERT CARD or IMMUNOTHERAPY ALERT CARD at check-in to  the Emergency Department and triage nurse. ° °Should you have questions after your visit or need to cancel or reschedule your appointment, please contact [facility]  Dept: [phone]  and follow the prompts.  Office hours are 8:00 a.m. to 4:30 p.m. Monday - Friday. Please note that voicemails left after 4:00 p.m. may not be returned until the following business day.  We are closed weekends and major holidays. You have access to a nurse at all times for urgent questions. Please call the main number to the clinic Dept: [phone] and follow the prompts. ° ° °For any non-urgent questions, you may also contact your provider using MyChart. We now offer e-Visits for anyone 18 and older to request care online for non-urgent symptoms. For details visit [email or web address]. °  °Also download the MyChart app! Go to the app store, search "MyChart", open the app, select [facility], and log in with your MyChart username and password. ° °Due to Covid, a mask is required upon entering the hospital/clinic. If you do not have a mask, one will be given to you upon arrival. For doctor visits, patients may have 1 support person aged 18 or older with them. For treatment visits, patients cannot have anyone with them due to current Covid guidelines and our immunocompromised population.  ° °

## 2021-11-06 ENCOUNTER — Ambulatory Visit: Payer: Medicare HMO

## 2021-11-06 ENCOUNTER — Telehealth: Payer: Self-pay | Admitting: *Deleted

## 2021-11-06 LAB — PROSTATE-SPECIFIC AG, SERUM (LABCORP): Prostate Specific Ag, Serum: 256 ng/mL — ABNORMAL HIGH (ref 0.0–4.0)

## 2021-11-06 NOTE — Telephone Encounter (Signed)
-----   Message from Wyatt Portela, MD sent at 11/06/2021  8:53 AM EST ----- Please let him know his PSA is up. I anticipate his PSA to come down after few cycles of chemo

## 2021-11-06 NOTE — Telephone Encounter (Signed)
PC to patient, informed him of Dr. Hazeline Junker message below.  He verbalizes understanding.

## 2021-11-07 ENCOUNTER — Other Ambulatory Visit: Payer: Self-pay

## 2021-11-07 ENCOUNTER — Inpatient Hospital Stay: Payer: Medicare HMO

## 2021-11-07 VITALS — BP 113/65 | HR 81 | Temp 98.5°F | Resp 18

## 2021-11-07 DIAGNOSIS — C7951 Secondary malignant neoplasm of bone: Secondary | ICD-10-CM | POA: Diagnosis not present

## 2021-11-07 DIAGNOSIS — Z5111 Encounter for antineoplastic chemotherapy: Secondary | ICD-10-CM | POA: Diagnosis not present

## 2021-11-07 DIAGNOSIS — C61 Malignant neoplasm of prostate: Secondary | ICD-10-CM | POA: Diagnosis not present

## 2021-11-07 DIAGNOSIS — Z5189 Encounter for other specified aftercare: Secondary | ICD-10-CM | POA: Diagnosis not present

## 2021-11-07 MED ORDER — PEGFILGRASTIM-CBQV 6 MG/0.6ML ~~LOC~~ SOSY
6.0000 mg | PREFILLED_SYRINGE | Freq: Once | SUBCUTANEOUS | Status: AC
  Administered 2021-11-07: 6 mg via SUBCUTANEOUS
  Filled 2021-11-07: qty 0.6

## 2021-11-13 ENCOUNTER — Telehealth (HOSPITAL_COMMUNITY): Payer: Self-pay | Admitting: *Deleted

## 2021-11-13 NOTE — Telephone Encounter (Signed)
Reaching out to patient to offer assistance regarding upcoming cardiac imaging study; pt verbalizes understanding of appt date/time, parking situation and where to check in, pre-test NPO status and medications ordered, and verified current allergies; name and call back number provided for further questions should they arise  Jose Clement RN Navigator Cardiac Imaging [name] [phone] office [phone] cell  Patient to take 50mg  metoprolol tartrate two hours prior to cardiac CT scan.  Patient has a port but will be ok for staff to start IV for CT scan.  He was informed to arrive at 11:30am for his noon scan.

## 2021-11-14 ENCOUNTER — Ambulatory Visit (HOSPITAL_COMMUNITY)
Admission: RE | Admit: 2021-11-14 | Discharge: 2021-11-14 | Disposition: A | Discharge status: Home / Self Care | Payer: Medicare HMO | Source: Ambulatory Visit | Attending: Cardiovascular Disease | Admitting: Cardiovascular Disease

## 2021-11-14 ENCOUNTER — Other Ambulatory Visit: Payer: Self-pay

## 2021-11-14 DIAGNOSIS — C7951 Secondary malignant neoplasm of bone: Secondary | ICD-10-CM | POA: Diagnosis not present

## 2021-11-14 DIAGNOSIS — I700 Atherosclerosis of aorta: Secondary | ICD-10-CM | POA: Diagnosis not present

## 2021-11-14 DIAGNOSIS — R079 Chest pain, unspecified: Secondary | ICD-10-CM | POA: Insufficient documentation

## 2021-11-14 DIAGNOSIS — I2510 Atherosclerotic heart disease of native coronary artery without angina pectoris: Secondary | ICD-10-CM | POA: Diagnosis not present

## 2021-11-14 DIAGNOSIS — C61 Malignant neoplasm of prostate: Secondary | ICD-10-CM | POA: Diagnosis not present

## 2021-11-14 MED ORDER — IOHEXOL 300 MG/ML  SOLN
100.0000 mL | Freq: Once | INTRAMUSCULAR | Status: AC | PRN
  Administered 2021-11-14: 100 mL via INTRAVENOUS

## 2021-11-14 MED ORDER — NITROGLYCERIN 0.4 MG SL SUBL: 0.8000 mg | SUBLINGUAL_TABLET | Freq: Once | SUBLINGUAL | Status: AC

## 2021-11-14 MED ORDER — NITROGLYCERIN 0.4 MG SL SUBL
SUBLINGUAL_TABLET | SUBLINGUAL | Status: AC
  Administered 2021-11-14: 0.8 mg via SUBLINGUAL
  Filled 2021-11-14: qty 2

## 2021-11-16 ENCOUNTER — Other Ambulatory Visit: Payer: Self-pay | Admitting: Cardiology

## 2021-11-16 DIAGNOSIS — R931 Abnormal findings on diagnostic imaging of heart and coronary circulation: Secondary | ICD-10-CM

## 2021-11-17 ENCOUNTER — Ambulatory Visit (HOSPITAL_COMMUNITY)
Admission: RE | Admit: 2021-11-17 | Discharge: 2021-11-17 | Disposition: A | Discharge status: Home / Self Care | Payer: Medicare HMO | Source: Ambulatory Visit | Attending: Cardiology | Admitting: Cardiology

## 2021-11-17 DIAGNOSIS — I2510 Atherosclerotic heart disease of native coronary artery without angina pectoris: Secondary | ICD-10-CM | POA: Diagnosis not present

## 2021-11-17 DIAGNOSIS — R931 Abnormal findings on diagnostic imaging of heart and coronary circulation: Secondary | ICD-10-CM | POA: Diagnosis not present

## 2021-11-19 DIAGNOSIS — C7951 Secondary malignant neoplasm of bone: Secondary | ICD-10-CM | POA: Diagnosis not present

## 2021-11-19 DIAGNOSIS — C61 Malignant neoplasm of prostate: Secondary | ICD-10-CM | POA: Diagnosis not present

## 2021-11-25 MED FILL — Dexamethasone Sodium Phosphate Inj 100 MG/10ML: INTRAMUSCULAR | Qty: 1 | Status: AC

## 2021-11-26 ENCOUNTER — Inpatient Hospital Stay: Payer: Medicare HMO

## 2021-11-26 ENCOUNTER — Inpatient Hospital Stay: Payer: Medicare HMO | Admitting: Oncology

## 2021-11-26 ENCOUNTER — Other Ambulatory Visit: Payer: Self-pay

## 2021-11-26 VITALS — BP 128/71 | HR 79 | Temp 97.9°F | Resp 17 | Ht 72.0 in | Wt 163.8 lb

## 2021-11-26 DIAGNOSIS — C61 Malignant neoplasm of prostate: Secondary | ICD-10-CM

## 2021-11-26 DIAGNOSIS — Z5189 Encounter for other specified aftercare: Secondary | ICD-10-CM | POA: Diagnosis not present

## 2021-11-26 DIAGNOSIS — Z192 Hormone resistant malignancy status: Secondary | ICD-10-CM | POA: Diagnosis not present

## 2021-11-26 DIAGNOSIS — C7951 Secondary malignant neoplasm of bone: Secondary | ICD-10-CM | POA: Diagnosis not present

## 2021-11-26 DIAGNOSIS — Z5111 Encounter for antineoplastic chemotherapy: Secondary | ICD-10-CM | POA: Diagnosis not present

## 2021-11-26 DIAGNOSIS — Z95828 Presence of other vascular implants and grafts: Secondary | ICD-10-CM

## 2021-11-26 LAB — CBC WITH DIFFERENTIAL (CANCER CENTER ONLY)
Abs Immature Granulocytes: 0.03 10*3/uL (ref 0.00–0.07)
Basophils Absolute: 0.1 10*3/uL (ref 0.0–0.1)
Basophils Relative: 2 %
Eosinophils Absolute: 0.1 10*3/uL (ref 0.0–0.5)
Eosinophils Relative: 2 %
HCT: 25.7 % — ABNORMAL LOW (ref 39.0–52.0)
Hemoglobin: 8.3 g/dL — ABNORMAL LOW (ref 13.0–17.0)
Immature Granulocytes: 1 %
Lymphocytes Relative: 9 %
Lymphs Abs: 0.5 10*3/uL — ABNORMAL LOW (ref 0.7–4.0)
MCH: 28.6 pg (ref 26.0–34.0)
MCHC: 32.3 g/dL (ref 30.0–36.0)
MCV: 88.6 fL (ref 80.0–100.0)
Monocytes Absolute: 0.7 10*3/uL (ref 0.1–1.0)
Monocytes Relative: 13 %
Neutro Abs: 4 10*3/uL (ref 1.7–7.7)
Neutrophils Relative %: 73 %
Platelet Count: 378 10*3/uL (ref 150–400)
RBC: 2.9 MIL/uL — ABNORMAL LOW (ref 4.22–5.81)
RDW: 19.7 % — ABNORMAL HIGH (ref 11.5–15.5)
WBC Count: 5.5 10*3/uL (ref 4.0–10.5)
nRBC: 0 % (ref 0.0–0.2)

## 2021-11-26 LAB — CMP (CANCER CENTER ONLY)
ALT: 30 U/L (ref 0–44)
AST: 38 U/L (ref 15–41)
Albumin: 3.5 g/dL (ref 3.5–5.0)
Alkaline Phosphatase: 59 U/L (ref 38–126)
Anion gap: 5 (ref 5–15)
BUN: 19 mg/dL (ref 8–23)
CO2: 27 mmol/L (ref 22–32)
Calcium: 8.7 mg/dL — ABNORMAL LOW (ref 8.9–10.3)
Chloride: 103 mmol/L (ref 98–111)
Creatinine: 0.89 mg/dL (ref 0.61–1.24)
GFR, Estimated: >60 mL/min (ref >60)
Glucose, Bld: 97 mg/dL (ref 70–99)
Potassium: 4.4 mmol/L (ref 3.5–5.1)
Sodium: 135 mmol/L (ref 135–145)
Total Bilirubin: 0.6 mg/dL (ref 0.3–1.2)
Total Protein: 6.5 g/dL (ref 6.5–8.1)

## 2021-11-26 MED ORDER — SODIUM CHLORIDE 0.9 % IV SOLN
10.0000 mg | Freq: Once | INTRAVENOUS | Status: AC
  Administered 2021-11-26: 10 mg via INTRAVENOUS
  Filled 2021-11-26: qty 10

## 2021-11-26 MED ORDER — SODIUM CHLORIDE 0.9% FLUSH
10.0000 mL | Freq: Once | INTRAVENOUS | Status: AC
  Administered 2021-11-26: 10 mL

## 2021-11-26 MED ORDER — SODIUM CHLORIDE 0.9% FLUSH
10.0000 mL | INTRAVENOUS | Status: DC | PRN
  Administered 2021-11-26: 10 mL

## 2021-11-26 MED ORDER — SODIUM CHLORIDE 0.9 % IV SOLN: Freq: Once | INTRAVENOUS | Status: AC

## 2021-11-26 MED ORDER — SODIUM CHLORIDE 0.9 % IV SOLN
75.0000 mg/m2 | Freq: Once | INTRAVENOUS | Status: AC
  Administered 2021-11-26: 150 mg via INTRAVENOUS
  Filled 2021-11-26: qty 15

## 2021-11-26 MED ORDER — HEPARIN SOD (PORK) LOCK FLUSH 100 UNIT/ML IV SOLN
500.0000 [IU] | Freq: Once | INTRAVENOUS | Status: AC | PRN
  Administered 2021-11-26: 500 [IU]

## 2021-11-26 NOTE — Patient Instructions (Signed)
[facility]  Discharge Instructions: ?Thank you for choosing [facility] to provide your oncology and hematology care.  ? ?If you have a lab appointment with the Cancer Center, please go directly to the Cancer Center and check in at the registration area. ?  ?Wear comfortable clothing and clothing appropriate for easy access to any Portacath or PICC line.  ? ?We strive to give you quality time with your provider. You may need to reschedule your appointment if you arrive late (15 or more minutes).  Arriving late affects you and other patients whose appointments are after yours.  Also, if you miss three or more appointments without notifying the office, you may be dismissed from the clinic at the provider?s discretion.    ?  ?For prescription refill requests, have your pharmacy contact our office and allow 72 hours for refills to be completed.   ? ?Today you received the following chemotherapy and/or immunotherapy agents : Taxotere    ?  ?To help prevent nausea and vomiting after your treatment, we encourage you to take your nausea medication as directed. ? ?BELOW ARE SYMPTOMS THAT SHOULD BE REPORTED IMMEDIATELY: ?*FEVER GREATER THAN 100.4 F (38 ?C) OR HIGHER ?*CHILLS OR SWEATING ?*NAUSEA AND VOMITING THAT IS NOT CONTROLLED WITH YOUR NAUSEA MEDICATION ?*UNUSUAL SHORTNESS OF BREATH ?*UNUSUAL BRUISING OR BLEEDING ?*URINARY PROBLEMS (pain or burning when urinating, or frequent urination) ?*BOWEL PROBLEMS (unusual diarrhea, constipation, pain near the anus) ?TENDERNESS IN MOUTH AND THROAT WITH OR WITHOUT PRESENCE OF ULCERS (sore throat, sores in mouth, or a toothache) ?UNUSUAL RASH, SWELLING OR PAIN  ?UNUSUAL VAGINAL DISCHARGE OR ITCHING  ? ?Items with * indicate a potential emergency and should be followed up as soon as possible or go to the Emergency Department if any problems should occur. ? ?Please show the CHEMOTHERAPY ALERT CARD or IMMUNOTHERAPY ALERT CARD at check-in to  the Emergency Department and triage nurse. ? ?Should you have questions after your visit or need to cancel or reschedule your appointment, please contact [facility]  Dept: [phone]  and follow the prompts.  Office hours are 8:00 a.m. to 4:30 p.m. Monday - Friday. Please note that voicemails left after 4:00 p.m. may not be returned until the following business day.  We are closed weekends and major holidays. You have access to a nurse at all times for urgent questions. Please call the main number to the clinic Dept: [phone] and follow the prompts. ? ? ?For any non-urgent questions, you may also contact your provider using MyChart. We now offer e-Visits for anyone 18 and older to request care online for non-urgent symptoms. For details visit [email or web address]. ?  ?Also download the MyChart app! Go to the app store, search "MyChart", open the app, select [facility], and log in with your MyChart username and password. ? ?Due to Covid, a mask is required upon entering the hospital/clinic. If you do not have a mask, one will be given to you upon arrival. For doctor visits, patients may have 1 support person aged 18 or older with them. For treatment visits, patients cannot have anyone with them due to current Covid guidelines and our immunocompromised population.  ? ?

## 2021-11-26 NOTE — Progress Notes (Signed)
Hematology and Oncology Follow Up Visit  Jose Wolfe [id number] 1946-05-20 75 y.o. 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  He he presented with Gleason score of 6 in 2011.   Prior Therapy:  Hew as treated with brachytherapy and seed implants in 2011.  His PSA started to rise in 2013 was up to 2.42.  His PSA was 20 in 2018.   He enrolled in the HERO trial and he had a initial PSA response in 2018.  In February 2020 was 2.63 and subsequently in March 2019 was 5.09.  He was was started on apalutamide with initial PSA decline to 2.49.  His PSA on October 2 was up again to 4.13.  CT scan of the abdomen and pelvis on 10/04/2019 showed a 2.9 soft tissue mass in the left lower pelvis along the lateral aspect of the bladder that is consistent with metastatic disease.     He has received radiation therapy under the care of Dr. Tammi Klippel completing 2 weeks of SBRT to the left bladder and continued on Lupron and Erleada.     He had received radiation therapy subsequently under the care of Dr. Tammi Klippel between June 16 and June 29 for a total of 50 Gray in 10 fractions. His systemic therapy was switched to abiraterone in May 2022.  His PSA on August 20, 2021 was 60.1 with a castrate level testosterone.  His PSA was 4.28 in March 2022.   Abiraterone 1000 mg daily discontinued in October 2022 due to progression of disease.    Current therapy:   Taxotere chemotherapy 75 mg per metered squared started on October 17, 2021.  He is here for cycle 3 of therapy.  Androgen deprivation therapy under the care of Dr. Junious Silk.  Interim History: Mr. Tetrick returns today for a follow-up visit.  Since last visit, he has reports feeling well without any major complaints.  He tolerated the last cycle of therapy without any issues.  He denies any  nausea, vomiting or abdominal pain.  He denies any recent hospitalizations or illnesses.  He denies any worsening neuropathy or fatigue.  His performance status quality of life remain excellent.      Medications: Reviewed without changes. Current Outpatient Medications  Medication Sig Dispense Refill   abiraterone acetate (ZYTIGA) 250 MG tablet Take 1,000 mg by mouth daily.     B Complex Vitamins (VITAMIN B COMPLEX) TABS Take 1 tablet by mouth daily.     betamethasone dipropionate (DIPROLENE) 0.05 % cream Apply 1 application topically 2 (two) times daily as needed (for psoriasis).   3   budesonide-formoterol (SYMBICORT) 160-4.5 MCG/ACT inhaler Inhale 2 puffs into the lungs 2 (two) times daily.      calcium-vitamin D (OSCAL WITH D) 500-200 MG-UNIT tablet Take 1 tablet by mouth.     Cyanocobalamin (VITAMIN B 12 PO) Take by mouth daily.     leuprolide, 6 Month, (LEUPROLIDE ACETATE, 6 MONTH,) 45 MG injection See admin instructions.     lidocaine-prilocaine (EMLA) cream Apply 1 application topically as needed. 30 g 0   metoprolol tartrate (LOPRESSOR) 50 MG tablet Take one tablet two hours prior to the test 1 tablet 0   predniSONE (DELTASONE) 5 MG tablet Take 5 mg by mouth daily.     prochlorperazine (COMPAZINE) 10 MG tablet Take 1 tablet (10 mg total) by mouth every 6 (six) hours as  needed for nausea or vomiting. 60 tablet 3   pseudoephedrine-acetaminophen (TYLENOL SINUS) 30-500 MG TABS tablet Take 1 tablet by mouth every 4 (four) hours as needed.     triamcinolone cream (KENALOG) 0.1 % Apply 1 application topically 2 (two) times daily as needed (for psoriasis).  2   VENTOLIN HFA 108 (90 Base) MCG/ACT inhaler INHALE 1 PUFF BY MOUTH EVERY 4 HOURS AS NEEDED 1 each 0   No current facility-administered medications for this visit.     Allergies:  Allergies  Allergen Reactions   Atorvastatin     Other reaction(s): Myalgias      Physical Exam:  Blood pressure 128/71, pulse 79, temperature  97.9 F (36.6 C), temperature source Oral, resp. rate 17, height 6' (1.829 m), weight 163 lb 12.8 oz (74.3 kg), SpO2 100 %.   ECOG: 1   General appearance: Comfortable appearing without any discomfort Head: Normocephalic without any trauma Oropharynx: Mucous membranes are moist and pink without any thrush or ulcers. Eyes: Pupils are equal and round reactive to light. Lymph nodes: No cervical, supraclavicular, inguinal or axillary lymphadenopathy.   Heart:regular rate and rhythm.  S1 and S2 without leg edema. Lung: Clear without any rhonchi or wheezes.  No dullness to percussion. Abdomin: Soft, nontender, nondistended with good bowel sounds.  No hepatosplenomegaly. Musculoskeletal: No joint deformity or effusion.  Full range of motion noted. Neurological: No deficits noted on motor, sensory and deep tendon reflex exam. Skin: No petechial rash or dryness.  Appeared moist.      Lab Results: Lab Results  Component Value Date   WBC 6.2 11/05/2021   HGB 9.7 (L) 11/05/2021   HCT 28.7 (L) 11/05/2021   MCV 85.9 11/05/2021   PLT 431 (H) 11/05/2021     Chemistry      Component Value Date/Time   NA 137 11/05/2021 1111   K 4.3 11/05/2021 1111   CL 101 11/05/2021 1111   CO2 28 11/05/2021 1111   BUN 17 11/05/2021 1111   CREATININE 0.82 11/05/2021 1111      Component Value Date/Time   CALCIUM 9.4 11/05/2021 1111   ALKPHOS 85 11/05/2021 1111   AST 29 11/05/2021 1111   ALT 21 11/05/2021 1111   BILITOT 0.3 11/05/2021 1111           Impression and Plan:   75 year old with:  1.  Advanced prostate cancer diagnosed in 2020.  He has castration-resistant with bone and lymphadenopathy.  Risks and benefits of continuing Taxotere chemotherapy were discussed at this time.  These include nausea, vomiting, myelosuppression, neutropenia and possible sepsis.  His PSA is pending from today and will continue to monitor on the current treatment.  He is agreeable to proceed with plan is to  complete 10 cycles of therapy.  We will update his PSA and communicate these results for him.  2.  IV access: Port-A-Cath currently in use for chemotherapy without any complications.  3.  Antiemetics: No nausea or vomiting reported at this time.  Compazine is available to him.  4.  Androgen deprivation therapy: I recommended continuing this indefinitely.  He is receiving that under the care of Dr. Junious Silk.  5.  Bone directed therapy: I recommended calcium and vitamin D supplements and institute Xgeva after dental clearance.  6.  Growth factor support: He is at risk of neutropenia and possible sepsis.  He will receive growth factor support after each cycle.  7.  Follow-up: He will return in 3 weeks for the next cycle of  therapy.   30  minutes were dedicated to this encounter.  Time was spent on reviewing laboratory data, disease status update and addressing complication related to cancer and cancer therapy.      Zola Button, MD 11/30/20228:40 AM

## 2021-11-27 ENCOUNTER — Other Ambulatory Visit: Payer: Medicare HMO

## 2021-11-27 ENCOUNTER — Telehealth: Payer: Self-pay | Admitting: *Deleted

## 2021-11-27 ENCOUNTER — Ambulatory Visit: Payer: Medicare HMO

## 2021-11-27 ENCOUNTER — Ambulatory Visit: Payer: Medicare HMO | Admitting: Physician Assistant

## 2021-11-27 LAB — PROSTATE-SPECIFIC AG, SERUM (LABCORP): Prostate Specific Ag, Serum: 240 ng/mL — ABNORMAL HIGH (ref 0.0–4.0)

## 2021-11-27 NOTE — Telephone Encounter (Signed)
Notified of message below

## 2021-11-27 NOTE — Telephone Encounter (Signed)
-----   Message from Wyatt Portela, MD sent at 11/27/2021  9:37 AM EST ----- Please let him know his PSA is down

## 2021-11-28 ENCOUNTER — Inpatient Hospital Stay: Payer: Medicare HMO | Attending: Oncology

## 2021-11-28 ENCOUNTER — Other Ambulatory Visit: Payer: Self-pay

## 2021-11-28 VITALS — BP 129/68 | HR 79 | Temp 98.2°F | Resp 18

## 2021-11-28 DIAGNOSIS — C61 Malignant neoplasm of prostate: Secondary | ICD-10-CM | POA: Diagnosis present

## 2021-11-28 DIAGNOSIS — Z5111 Encounter for antineoplastic chemotherapy: Secondary | ICD-10-CM | POA: Diagnosis present

## 2021-11-28 DIAGNOSIS — Z192 Hormone resistant malignancy status: Secondary | ICD-10-CM

## 2021-11-28 DIAGNOSIS — Z5189 Encounter for other specified aftercare: Secondary | ICD-10-CM | POA: Insufficient documentation

## 2021-11-28 DIAGNOSIS — C7951 Secondary malignant neoplasm of bone: Secondary | ICD-10-CM | POA: Diagnosis present

## 2021-11-28 MED ORDER — PEGFILGRASTIM-CBQV 6 MG/0.6ML ~~LOC~~ SOSY
6.0000 mg | PREFILLED_SYRINGE | Freq: Once | SUBCUTANEOUS | Status: AC
  Administered 2021-11-28: 6 mg via SUBCUTANEOUS
  Filled 2021-11-28: qty 0.6

## 2021-11-29 ENCOUNTER — Ambulatory Visit: Payer: Medicare HMO

## 2021-12-03 DIAGNOSIS — Z5111 Encounter for antineoplastic chemotherapy: Secondary | ICD-10-CM | POA: Diagnosis not present

## 2021-12-03 DIAGNOSIS — R3915 Urgency of urination: Secondary | ICD-10-CM | POA: Diagnosis not present

## 2021-12-03 DIAGNOSIS — C61 Malignant neoplasm of prostate: Secondary | ICD-10-CM | POA: Diagnosis not present

## 2021-12-03 DIAGNOSIS — R3121 Asymptomatic microscopic hematuria: Secondary | ICD-10-CM | POA: Diagnosis not present

## 2021-12-05 ENCOUNTER — Telehealth: Payer: Self-pay | Admitting: Oncology

## 2021-12-05 NOTE — Telephone Encounter (Signed)
Scheduled per 11/30 los, patient has been called and voicemail was left.

## 2021-12-16 ENCOUNTER — Other Ambulatory Visit: Payer: Self-pay | Admitting: Urology

## 2021-12-17 ENCOUNTER — Inpatient Hospital Stay: Payer: Medicare HMO

## 2021-12-17 ENCOUNTER — Inpatient Hospital Stay: Payer: Medicare HMO | Admitting: Oncology

## 2021-12-17 ENCOUNTER — Other Ambulatory Visit: Payer: Self-pay

## 2021-12-17 VITALS — BP 133/85 | HR 85 | Temp 97.6°F | Resp 16 | Ht 72.0 in | Wt 162.4 lb

## 2021-12-17 DIAGNOSIS — C61 Malignant neoplasm of prostate: Secondary | ICD-10-CM | POA: Diagnosis not present

## 2021-12-17 DIAGNOSIS — Z5189 Encounter for other specified aftercare: Secondary | ICD-10-CM | POA: Diagnosis not present

## 2021-12-17 DIAGNOSIS — Z5111 Encounter for antineoplastic chemotherapy: Secondary | ICD-10-CM | POA: Diagnosis not present

## 2021-12-17 DIAGNOSIS — Z192 Hormone resistant malignancy status: Secondary | ICD-10-CM

## 2021-12-17 DIAGNOSIS — Z95828 Presence of other vascular implants and grafts: Secondary | ICD-10-CM

## 2021-12-17 DIAGNOSIS — C7951 Secondary malignant neoplasm of bone: Secondary | ICD-10-CM | POA: Diagnosis not present

## 2021-12-17 LAB — CMP (CANCER CENTER ONLY)
ALT: 19 U/L (ref 0–44)
AST: 28 U/L (ref 15–41)
Albumin: 3.9 g/dL (ref 3.5–5.0)
Alkaline Phosphatase: 63 U/L (ref 38–126)
Anion gap: 8 (ref 5–15)
BUN: 20 mg/dL (ref 8–23)
CO2: 28 mmol/L (ref 22–32)
Calcium: 9.1 mg/dL (ref 8.9–10.3)
Chloride: 101 mmol/L (ref 98–111)
Creatinine: 0.89 mg/dL (ref 0.61–1.24)
GFR, Estimated: >60 mL/min (ref >60)
Glucose, Bld: 102 mg/dL — ABNORMAL HIGH (ref 70–99)
Potassium: 4.4 mmol/L (ref 3.5–5.1)
Sodium: 137 mmol/L (ref 135–145)
Total Bilirubin: 0.4 mg/dL (ref 0.3–1.2)
Total Protein: 6.8 g/dL (ref 6.5–8.1)

## 2021-12-17 LAB — CBC WITH DIFFERENTIAL (CANCER CENTER ONLY)
Abs Immature Granulocytes: 0.03 10*3/uL (ref 0.00–0.07)
Basophils Absolute: 0.1 10*3/uL (ref 0.0–0.1)
Basophils Relative: 2 %
Eosinophils Absolute: 0 10*3/uL (ref 0.0–0.5)
Eosinophils Relative: 1 %
HCT: 25.4 % — ABNORMAL LOW (ref 39.0–52.0)
Hemoglobin: 8.4 g/dL — ABNORMAL LOW (ref 13.0–17.0)
Immature Granulocytes: 0 %
Lymphocytes Relative: 6 %
Lymphs Abs: 0.5 10*3/uL — ABNORMAL LOW (ref 0.7–4.0)
MCH: 30 pg (ref 26.0–34.0)
MCHC: 33.1 g/dL (ref 30.0–36.0)
MCV: 90.7 fL (ref 80.0–100.0)
Monocytes Absolute: 1.1 10*3/uL — ABNORMAL HIGH (ref 0.1–1.0)
Monocytes Relative: 14 %
Neutro Abs: 6 10*3/uL (ref 1.7–7.7)
Neutrophils Relative %: 77 %
Platelet Count: 363 10*3/uL (ref 150–400)
RBC: 2.8 MIL/uL — ABNORMAL LOW (ref 4.22–5.81)
RDW: 21.4 % — ABNORMAL HIGH (ref 11.5–15.5)
WBC Count: 7.8 10*3/uL (ref 4.0–10.5)
nRBC: 0 % (ref 0.0–0.2)

## 2021-12-17 MED ORDER — HEPARIN SOD (PORK) LOCK FLUSH 100 UNIT/ML IV SOLN
500.0000 [IU] | Freq: Once | INTRAVENOUS | Status: AC | PRN
  Administered 2021-12-17: 13:00:00 500 [IU]

## 2021-12-17 MED ORDER — SODIUM CHLORIDE 0.9% FLUSH: 10.0000 mL | INTRAVENOUS | Status: DC | PRN

## 2021-12-17 MED ORDER — SODIUM CHLORIDE 0.9% FLUSH
10.0000 mL | Freq: Once | INTRAVENOUS | Status: AC
  Administered 2021-12-17: 10:00:00 10 mL

## 2021-12-17 MED ORDER — SODIUM CHLORIDE 0.9 % IV SOLN
10.0000 mg | Freq: Once | INTRAVENOUS | Status: AC
  Administered 2021-12-17: 11:00:00 10 mg via INTRAVENOUS
  Filled 2021-12-17: qty 10

## 2021-12-17 MED ORDER — SODIUM CHLORIDE 0.9 % IV SOLN
75.0000 mg/m2 | Freq: Once | INTRAVENOUS | Status: AC
  Administered 2021-12-17: 11:00:00 150 mg via INTRAVENOUS
  Filled 2021-12-17: qty 15

## 2021-12-17 MED ORDER — SODIUM CHLORIDE 0.9 % IV SOLN: Freq: Once | INTRAVENOUS | Status: AC

## 2021-12-17 NOTE — Progress Notes (Signed)
Hematology and Oncology Follow Up Visit  Jose Wolfe [id number] July 23, 1946 75 y.o. 12/17/2021 10:45 AM [name], MDRoss, [name], MD   Principle Diagnosis: 75 year old man with advanced prostate cancer and lymphadenopathy diagnosed in 2020.  He has castration-resistant after presenting with localized disease in 2011 and Gleason score of 6.   Prior Therapy:  Hew as treated with brachytherapy and seed implants in 2011.  His PSA started to rise in 2013 was up to 2.42.  His PSA was 20 in 2018.   He enrolled in the HERO trial and he had a initial PSA response in 2018.  In February 2020 was 2.63 and subsequently in March 2019 was 5.09.  He was was started on apalutamide with initial PSA decline to 2.49.  His PSA on October 2 was up again to 4.13.  CT scan of the abdomen and pelvis on 10/04/2019 showed a 2.9 soft tissue mass in the left lower pelvis along the lateral aspect of the bladder that is consistent with metastatic disease.     He has received radiation therapy under the care of Dr. Tammi Klippel completing 2 weeks of SBRT to the left bladder and continued on Lupron and Erleada.     He had received radiation therapy subsequently under the care of Dr. Tammi Klippel between June 16 and June 29 for a total of 50 Gray in 10 fractions. His systemic therapy was switched to abiraterone in May 2022.  His PSA on August 20, 2021 was 60.1 with a castrate level testosterone.  His PSA was 4.28 in March 2022.   Abiraterone 1000 mg daily discontinued in October 2022 due to progression of disease.    Current therapy:   Taxotere chemotherapy 75 mg per metered squared started on October 17, 2021.  He is here for cycle 4 of therapy.  Androgen deprivation therapy under the care of Dr. Junious Silk.  Interim History: Mr. Jose Wolfe is here for a follow-up evaluation.  Since last visit, he continues to tolerate chemotherapy without any major complaints.  He has reported some issues with constipation but  no nausea, vomiting or abdominal pain.  He denies any worsening neuropathy or fatigue.  His appetite is reasonable and weight is stable.      Medications: Updated on review. Current Outpatient Medications  Medication Sig Dispense Refill   abiraterone acetate (ZYTIGA) 250 MG tablet Take 1,000 mg by mouth daily.     B Complex Vitamins (VITAMIN B COMPLEX) TABS Take 1 tablet by mouth daily.     betamethasone dipropionate (DIPROLENE) 0.05 % cream Apply 1 application topically 2 (two) times daily as needed (for psoriasis).   3   budesonide-formoterol (SYMBICORT) 160-4.5 MCG/ACT inhaler Inhale 2 puffs into the lungs 2 (two) times daily.      calcium-vitamin D (OSCAL WITH D) 500-200 MG-UNIT tablet Take 1 tablet by mouth.     Cyanocobalamin (VITAMIN B 12 PO) Take by mouth daily.     leuprolide, 6 Month, (LEUPROLIDE ACETATE, 6 MONTH,) 45 MG injection See admin instructions.     lidocaine-prilocaine (EMLA) cream Apply 1 application topically as needed. 30 g 0   metoprolol tartrate (LOPRESSOR) 50 MG tablet Take one tablet two hours prior to the test 1 tablet 0   predniSONE (DELTASONE) 5 MG tablet Take 5 mg by mouth daily.     prochlorperazine (COMPAZINE) 10 MG tablet Take 1 tablet (10 mg total) by mouth every 6 (six) hours as needed for nausea or vomiting. 60 tablet 3   pseudoephedrine-acetaminophen (  TYLENOL SINUS) 30-500 MG TABS tablet Take 1 tablet by mouth every 4 (four) hours as needed.     triamcinolone cream (KENALOG) 0.1 % Apply 1 application topically 2 (two) times daily as needed (for psoriasis).  2   VENTOLIN HFA 108 (90 Base) MCG/ACT inhaler INHALE 1 PUFF BY MOUTH EVERY 4 HOURS AS NEEDED 1 each 0   No current facility-administered medications for this visit.   Facility-Administered Medications Ordered in Other Visits  Medication Dose Route Frequency Provider Last Rate Last Admin   dexamethasone (DECADRON) 10 mg in sodium chloride 0.9 % 50 mL IVPB  10 mg Intravenous Once Wyatt Portela, MD  204 mL/hr at 12/17/21 1038 10 mg at 12/17/21 1038   DOCEtaxel (TAXOTERE) 150 mg in sodium chloride 0.9 % 250 mL chemo infusion  75 mg/m2 (Treatment Plan Recorded) Intravenous Once Wyatt Portela, MD       heparin lock flush 100 unit/mL  500 Units Intracatheter Once PRN Wyatt Portela, MD       sodium chloride flush (NS) 0.9 % injection 10 mL  10 mL Intracatheter PRN Wyatt Portela, MD         Allergies:  Allergies  Allergen Reactions   Atorvastatin     Other reaction(s): Myalgias      Physical Exam:  Blood pressure 133/85, pulse 85, temperature 97.6 F (36.4 C), temperature source Temporal, resp. rate 16, height 6' (1.829 m), weight 162 lb 6.4 oz (73.7 kg), SpO2 100 %.   ECOG: 1    General appearance: Alert, awake without any distress. Head: Atraumatic without abnormalities Oropharynx: Without any thrush or ulcers. Eyes: No scleral icterus. Lymph nodes: No lymphadenopathy noted in the cervical, supraclavicular, or axillary nodes Heart:regular rate and rhythm, without any murmurs or gallops.   Lung: Clear to auscultation without any rhonchi, wheezes or dullness to percussion. Abdomin: Soft, nontender without any shifting dullness or ascites. Musculoskeletal: No clubbing or cyanosis. Neurological: No motor or sensory deficits. Dermatology examination: No rashes or lesions.    Lab Results: Lab Results  Component Value Date   WBC 7.8 12/17/2021   HGB 8.4 (L) 12/17/2021   HCT 25.4 (L) 12/17/2021   MCV 90.7 12/17/2021   PLT 363 12/17/2021     Chemistry      Component Value Date/Time   NA 137 12/17/2021 0936   K 4.4 12/17/2021 0936   CL 101 12/17/2021 0936   CO2 28 12/17/2021 0936   BUN 20 12/17/2021 0936   CREATININE 0.89 12/17/2021 0936      Component Value Date/Time   CALCIUM 9.1 12/17/2021 0936   ALKPHOS 63 12/17/2021 0936   AST 28 12/17/2021 0936   ALT 19 12/17/2021 0936   BILITOT 0.4 12/17/2021 0936        Latest Reference Range & Units 10/17/21  07:58 11/05/21 11:11 11/26/21 08:47  Prostate Specific Ag, Serum 0.0 - 4.0 ng/mL 183.0 (H) 256.0 (H) 240.0 (H)  (H): Data is abnormally high    Impression and Plan:   75 year old with:  1.  Castration-resistant advanced prostate cancer diagnosed in 2020 with lymphadenopathy.  He is currently on Taxotere chemotherapy which she has tolerated well without any complaints.  Complications that include nausea, vomiting, mild suppression among others were reiterated.  His PSA shows a mild decline and he is willing to continue for the time being.  Tentatively 10 cycles would be planned.  2.  IV access: Port-A-Cath continues to be in use without any issues.  3.  Antiemetics: Compazine is available to him without any nausea or vomiting.  4.  Androgen deprivation therapy: He is currently receiving that under the care of Dr. Junious Silk.  I recommended continuing this indefinitely.  5.  Bone directed therapy: For the time being he will continue on calcium and vitamin D supplements.  6.  Growth factor support: I recommended that after each cycle of chemotherapy given his risk of neutropenia and sepsis.  7.  Constipation: We have discussed strategies to improve that including increasing hydration, high-fiber diet and starting MiraLAX daily.  8.  Follow-up: In 3 weeks for repeat evaluation.   30  minutes were spent on this visit.  The time was dedicated to reviewing laboratory data, disease status update and addressing complication related to cancer and cancer therapy.      Zola Button, MD 12/21/202210:45 AM

## 2021-12-17 NOTE — Patient Instructions (Signed)
[facility]   ?Discharge Instructions: ?Thank you for choosing [facility] to provide your oncology and hematology care.  ? ?If you have a lab appointment with the Cancer Center, please go directly to the Cancer Center and check in at the registration area. ?  ?Wear comfortable clothing and clothing appropriate for easy access to any Portacath or PICC line.  ? ?We strive to give you quality time with your provider. You may need to reschedule your appointment if you arrive late (15 or more minutes).  Arriving late affects you and other patients whose appointments are after yours.  Also, if you miss three or more appointments without notifying the office, you may be dismissed from the clinic at the provider?s discretion.    ?  ?For prescription refill requests, have your pharmacy contact our office and allow 72 hours for refills to be completed.   ? ?Today you received the following chemotherapy and/or immunotherapy agents: docetaxel    ?  ?To help prevent nausea and vomiting after your treatment, we encourage you to take your nausea medication as directed. ? ?BELOW ARE SYMPTOMS THAT SHOULD BE REPORTED IMMEDIATELY: ?*FEVER GREATER THAN 100.4 F (38 ?C) OR HIGHER ?*CHILLS OR SWEATING ?*NAUSEA AND VOMITING THAT IS NOT CONTROLLED WITH YOUR NAUSEA MEDICATION ?*UNUSUAL SHORTNESS OF BREATH ?*UNUSUAL BRUISING OR BLEEDING ?*URINARY PROBLEMS (pain or burning when urinating, or frequent urination) ?*BOWEL PROBLEMS (unusual diarrhea, constipation, pain near the anus) ?TENDERNESS IN MOUTH AND THROAT WITH OR WITHOUT PRESENCE OF ULCERS (sore throat, sores in mouth, or a toothache) ?UNUSUAL RASH, SWELLING OR PAIN  ?UNUSUAL VAGINAL DISCHARGE OR ITCHING  ? ?Items with * indicate a potential emergency and should be followed up as soon as possible or go to the Emergency Department if any problems should occur. ? ?Please show the CHEMOTHERAPY ALERT CARD or IMMUNOTHERAPY ALERT CARD at check-in  to the Emergency Department and triage nurse. ? ?Should you have questions after your visit or need to cancel or reschedule your appointment, please contact [facility]  Dept: [phone]  and follow the prompts.  Office hours are 8:00 a.m. to 4:30 p.m. Monday - Friday. Please note that voicemails left after 4:00 p.m. may not be returned until the following business day.  We are closed weekends and major holidays. You have access to a nurse at all times for urgent questions. Please call the main number to the clinic Dept: [phone] and follow the prompts. ? ? ?For any non-urgent questions, you may also contact your provider using MyChart. We now offer e-Visits for anyone 18 and older to request care online for non-urgent symptoms. For details visit [email or web address]. ?  ?Also download the MyChart app! Go to the app store, search "MyChart", open the app, select [facility], and log in with your MyChart username and password. ? ?Due to Covid, a mask is required upon entering the hospital/clinic. If you do not have a mask, one will be given to you upon arrival. For doctor visits, patients may have 1 support person aged 18 or older with them. For treatment visits, patients cannot have anyone with them due to current Covid guidelines and our immunocompromised population.  ? ?

## 2021-12-18 ENCOUNTER — Telehealth: Payer: Self-pay | Admitting: *Deleted

## 2021-12-18 LAB — PROSTATE-SPECIFIC AG, SERUM (LABCORP): Prostate Specific Ag, Serum: 218 ng/mL — ABNORMAL HIGH (ref 0.0–4.0)

## 2021-12-18 NOTE — Telephone Encounter (Signed)
-----   Message from Wyatt Portela, MD sent at 12/18/2021  8:42 AM EST ----- Please let him know his PSA is down

## 2021-12-18 NOTE — Telephone Encounter (Signed)
Notified of message below

## 2021-12-19 ENCOUNTER — Inpatient Hospital Stay (HOSPITAL_BASED_OUTPATIENT_CLINIC_OR_DEPARTMENT_OTHER): Payer: Medicare HMO

## 2021-12-19 ENCOUNTER — Other Ambulatory Visit: Payer: Self-pay

## 2021-12-19 VITALS — BP 114/68 | HR 94 | Temp 98.0°F | Resp 20

## 2021-12-19 DIAGNOSIS — C61 Malignant neoplasm of prostate: Secondary | ICD-10-CM | POA: Diagnosis not present

## 2021-12-19 DIAGNOSIS — Z5111 Encounter for antineoplastic chemotherapy: Secondary | ICD-10-CM | POA: Diagnosis not present

## 2021-12-19 DIAGNOSIS — C7951 Secondary malignant neoplasm of bone: Secondary | ICD-10-CM | POA: Diagnosis not present

## 2021-12-19 DIAGNOSIS — Z5189 Encounter for other specified aftercare: Secondary | ICD-10-CM | POA: Diagnosis not present

## 2021-12-19 MED ORDER — PEGFILGRASTIM-CBQV 6 MG/0.6ML ~~LOC~~ SOSY
6.0000 mg | PREFILLED_SYRINGE | Freq: Once | SUBCUTANEOUS | Status: AC
  Administered 2021-12-19: 12:00:00 6 mg via SUBCUTANEOUS
  Filled 2021-12-19: qty 0.6

## 2021-12-22 ENCOUNTER — Encounter: Payer: Self-pay | Admitting: Oncology

## 2021-12-31 DIAGNOSIS — C61 Malignant neoplasm of prostate: Secondary | ICD-10-CM | POA: Diagnosis not present

## 2021-12-31 DIAGNOSIS — C7951 Secondary malignant neoplasm of bone: Secondary | ICD-10-CM | POA: Diagnosis not present

## 2022-01-02 ENCOUNTER — Encounter: Payer: Self-pay | Admitting: Oncology

## 2022-01-02 ENCOUNTER — Other Ambulatory Visit: Payer: Self-pay | Admitting: *Deleted

## 2022-01-02 DIAGNOSIS — C61 Malignant neoplasm of prostate: Secondary | ICD-10-CM

## 2022-01-02 DIAGNOSIS — Z192 Hormone resistant malignancy status: Secondary | ICD-10-CM

## 2022-01-02 MED ORDER — MEGESTROL ACETATE 625 MG/5ML PO SUSP: 625.0000 mg | Freq: Every day | ORAL | 0 refills | Status: DC

## 2022-01-06 ENCOUNTER — Other Ambulatory Visit: Payer: Self-pay

## 2022-01-06 ENCOUNTER — Encounter (HOSPITAL_BASED_OUTPATIENT_CLINIC_OR_DEPARTMENT_OTHER): Payer: Self-pay | Admitting: Urology

## 2022-01-06 MED FILL — Dexamethasone Sodium Phosphate Inj 100 MG/10ML: INTRAMUSCULAR | Qty: 1 | Status: AC

## 2022-01-06 NOTE — Progress Notes (Signed)
Spoke w/ via phone for pre-op interview---patient Lab needs dos----NA               Lab results------EKG, CMP, CBC, Echo COVID test -----patient states asymptomatic no test needed Arrive at 0700 01/09/22 NPO after MN NO Solid Food.  Clear liquids from MN until 0600 Med rec completed Medications to take morning of surgery -----Symbicort; please bring inhalers. Diabetic medication -----NA Patient instructed to bring photo id and insurance card day of surgery Patient aware to have Driver (ride ) / caregiver    for 24 hours after surgery  Patient Special Instructions ----- Pre-Op special Istructions ----- Patient verbalized understanding of instructions that were given at this phone interview. Patient denies shortness of breath, chest pain, fever, cough at this phone interview.

## 2022-01-07 ENCOUNTER — Inpatient Hospital Stay: Payer: Medicare HMO | Attending: Oncology

## 2022-01-07 ENCOUNTER — Inpatient Hospital Stay: Payer: Medicare HMO | Admitting: Oncology

## 2022-01-07 ENCOUNTER — Ambulatory Visit: Payer: Medicare HMO

## 2022-01-07 ENCOUNTER — Inpatient Hospital Stay: Payer: Medicare HMO

## 2022-01-07 VITALS — BP 135/71 | HR 79 | Temp 97.9°F | Resp 17 | Ht 72.0 in | Wt 156.5 lb

## 2022-01-07 DIAGNOSIS — Z192 Hormone resistant malignancy status: Secondary | ICD-10-CM

## 2022-01-07 DIAGNOSIS — K56600 Partial intestinal obstruction, unspecified as to cause: Secondary | ICD-10-CM | POA: Diagnosis not present

## 2022-01-07 DIAGNOSIS — Z79899 Other long term (current) drug therapy: Secondary | ICD-10-CM

## 2022-01-07 DIAGNOSIS — C61 Malignant neoplasm of prostate: Secondary | ICD-10-CM

## 2022-01-07 DIAGNOSIS — Z95828 Presence of other vascular implants and grafts: Secondary | ICD-10-CM | POA: Diagnosis not present

## 2022-01-07 DIAGNOSIS — N131 Hydronephrosis with ureteral stricture, not elsewhere classified: Secondary | ICD-10-CM | POA: Diagnosis present

## 2022-01-07 DIAGNOSIS — Z7951 Long term (current) use of inhaled steroids: Secondary | ICD-10-CM

## 2022-01-07 DIAGNOSIS — I2510 Atherosclerotic heart disease of native coronary artery without angina pectoris: Secondary | ICD-10-CM | POA: Diagnosis present

## 2022-01-07 DIAGNOSIS — Z8042 Family history of malignant neoplasm of prostate: Secondary | ICD-10-CM

## 2022-01-07 DIAGNOSIS — D630 Anemia in neoplastic disease: Secondary | ICD-10-CM | POA: Diagnosis present

## 2022-01-07 DIAGNOSIS — N179 Acute kidney failure, unspecified: Secondary | ICD-10-CM | POA: Diagnosis present

## 2022-01-07 DIAGNOSIS — K5900 Constipation, unspecified: Secondary | ICD-10-CM | POA: Diagnosis present

## 2022-01-07 DIAGNOSIS — K56609 Unspecified intestinal obstruction, unspecified as to partial versus complete obstruction: Secondary | ICD-10-CM | POA: Diagnosis not present

## 2022-01-07 DIAGNOSIS — C772 Secondary and unspecified malignant neoplasm of intra-abdominal lymph nodes: Secondary | ICD-10-CM | POA: Diagnosis present

## 2022-01-07 DIAGNOSIS — J449 Chronic obstructive pulmonary disease, unspecified: Secondary | ICD-10-CM | POA: Diagnosis present

## 2022-01-07 DIAGNOSIS — E8809 Other disorders of plasma-protein metabolism, not elsewhere classified: Secondary | ICD-10-CM | POA: Diagnosis present

## 2022-01-07 DIAGNOSIS — C7951 Secondary malignant neoplasm of bone: Secondary | ICD-10-CM | POA: Diagnosis present

## 2022-01-07 DIAGNOSIS — T451X5A Adverse effect of antineoplastic and immunosuppressive drugs, initial encounter: Secondary | ICD-10-CM | POA: Diagnosis present

## 2022-01-07 DIAGNOSIS — Z888 Allergy status to other drugs, medicaments and biological substances status: Secondary | ICD-10-CM

## 2022-01-07 DIAGNOSIS — I4510 Unspecified right bundle-branch block: Secondary | ICD-10-CM | POA: Diagnosis present

## 2022-01-07 DIAGNOSIS — Z20822 Contact with and (suspected) exposure to covid-19: Secondary | ICD-10-CM | POA: Diagnosis present

## 2022-01-07 DIAGNOSIS — E875 Hyperkalemia: Secondary | ICD-10-CM | POA: Diagnosis present

## 2022-01-07 DIAGNOSIS — Z66 Do not resuscitate: Secondary | ICD-10-CM | POA: Diagnosis present

## 2022-01-07 DIAGNOSIS — D701 Agranulocytosis secondary to cancer chemotherapy: Secondary | ICD-10-CM | POA: Diagnosis present

## 2022-01-07 LAB — CBC WITH DIFFERENTIAL (CANCER CENTER ONLY)
Abs Immature Granulocytes: 0.03 10*3/uL (ref 0.00–0.07)
Basophils Absolute: 0.1 10*3/uL (ref 0.0–0.1)
Basophils Relative: 1 %
Eosinophils Absolute: 0.1 10*3/uL (ref 0.0–0.5)
Eosinophils Relative: 1 %
HCT: 22.7 % — ABNORMAL LOW (ref 39.0–52.0)
Hemoglobin: 7.7 g/dL — ABNORMAL LOW (ref 13.0–17.0)
Immature Granulocytes: 1 %
Lymphocytes Relative: 8 %
Lymphs Abs: 0.4 10*3/uL — ABNORMAL LOW (ref 0.7–4.0)
MCH: 30.7 pg (ref 26.0–34.0)
MCHC: 33.9 g/dL (ref 30.0–36.0)
MCV: 90.4 fL (ref 80.0–100.0)
Monocytes Absolute: 0.7 10*3/uL (ref 0.1–1.0)
Monocytes Relative: 14 %
Neutro Abs: 3.7 10*3/uL (ref 1.7–7.7)
Neutrophils Relative %: 75 %
Platelet Count: 287 10*3/uL (ref 150–400)
RBC: 2.51 MIL/uL — ABNORMAL LOW (ref 4.22–5.81)
RDW: 20.4 % — ABNORMAL HIGH (ref 11.5–15.5)
WBC Count: 4.9 10*3/uL (ref 4.0–10.5)
nRBC: 0 % (ref 0.0–0.2)

## 2022-01-07 LAB — CMP (CANCER CENTER ONLY)
ALT: 14 U/L (ref 0–44)
AST: 23 U/L (ref 15–41)
Albumin: 3.7 g/dL (ref 3.5–5.0)
Alkaline Phosphatase: 51 U/L (ref 38–126)
Anion gap: 8 (ref 5–15)
BUN: 29 mg/dL — ABNORMAL HIGH (ref 8–23)
CO2: 24 mmol/L (ref 22–32)
Calcium: 8.8 mg/dL — ABNORMAL LOW (ref 8.9–10.3)
Chloride: 104 mmol/L (ref 98–111)
Creatinine: 1.87 mg/dL — ABNORMAL HIGH (ref 0.61–1.24)
GFR, Estimated: 37 mL/min — ABNORMAL LOW (ref >60)
Glucose, Bld: 96 mg/dL (ref 70–99)
Potassium: 4.6 mmol/L (ref 3.5–5.1)
Sodium: 136 mmol/L (ref 135–145)
Total Bilirubin: 0.3 mg/dL (ref 0.3–1.2)
Total Protein: 6.7 g/dL (ref 6.5–8.1)

## 2022-01-07 MED ORDER — SODIUM CHLORIDE 0.9 % IV SOLN: Freq: Once | INTRAVENOUS | Status: AC

## 2022-01-07 MED ORDER — HEPARIN SOD (PORK) LOCK FLUSH 100 UNIT/ML IV SOLN
500.0000 [IU] | Freq: Once | INTRAVENOUS | Status: AC | PRN
  Administered 2022-01-07: 500 [IU]

## 2022-01-07 MED ORDER — SODIUM CHLORIDE 0.9% FLUSH
10.0000 mL | INTRAVENOUS | Status: DC | PRN
  Administered 2022-01-07: 10 mL

## 2022-01-07 MED ORDER — SODIUM CHLORIDE 0.9% FLUSH
10.0000 mL | Freq: Once | INTRAVENOUS | Status: AC
  Administered 2022-01-07: 10 mL

## 2022-01-07 MED ORDER — SODIUM CHLORIDE 0.9 % IV SOLN
10.0000 mg | Freq: Once | INTRAVENOUS | Status: AC
  Administered 2022-01-07: 10 mg via INTRAVENOUS
  Filled 2022-01-07: qty 10

## 2022-01-07 MED ORDER — SODIUM CHLORIDE 0.9 % IV SOLN
60.0000 mg/m2 | Freq: Once | INTRAVENOUS | Status: AC
  Administered 2022-01-07: 120 mg via INTRAVENOUS
  Filled 2022-01-07: qty 12

## 2022-01-07 MED ORDER — SENNOSIDES-DOCUSATE SODIUM 8.6-50 MG PO TABS: 1.0000 | ORAL_TABLET | Freq: Every evening | ORAL | 1 refills | Status: AC | PRN

## 2022-01-07 NOTE — Progress Notes (Signed)
Hematology and Oncology Follow Up Visit  Jose Wolfe [id number] November 21, 1946 76 y.o. 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  He presented in 2011 with Gleason score of 6 and localized disease.   Prior Therapy:  [facility] as treated with brachytherapy and seed implants in 2011.  His PSA started to rise in 2013 was up to 2.42.  His PSA was 20 in 2018.   He enrolled in the HERO trial and he had a initial PSA response in 2018.  In February 2020 was 2.63 and subsequently in March 2019 was 5.09.  He was was started on apalutamide with initial PSA decline to 2.49.  His PSA on October 2 was up again to 4.13.  CT scan of the abdomen and pelvis on 10/04/2019 showed a 2.9 soft tissue mass in the left lower pelvis along the lateral aspect of the bladder that is consistent with metastatic disease.     He has received radiation therapy under the care of Dr. Tammi Klippel completing 2 weeks of SBRT to the left bladder and continued on Lupron and Erleada.     He had received radiation therapy subsequently under the care of Dr. Tammi Klippel between June 16 and June 29 for a total of 50 Gray in 10 fractions. His systemic therapy was switched to abiraterone in May 2022.  His PSA on August 20, 2021 was 60.1 with a castrate level testosterone.  His PSA was 4.28 in March 2022.   Abiraterone 1000 mg daily discontinued in October 2022 due to progression of disease.    Current therapy:   Taxotere chemotherapy 75 mg per metered squared started on October 17, 2021.  He is here for cycle 5 of therapy.  Androgen deprivation therapy under the care of Dr. Junious Silk.  Interim History: Jose Wolfe is here for return evaluation.  Since last visit, he reports no major changes in his health.  He has reported some constipation as well as weight loss associated with loss of appetite.   He denies any nausea, vomiting or abdominal pain.  He denies any recent hospitalizations or illnesses.  He denies any worsening neuropathy or dyspnea on exertion.     Medications: Reviewed without changes. Current Outpatient Medications  Medication Sig Dispense Refill   abiraterone acetate (ZYTIGA) 250 MG tablet Take 1,000 mg by mouth daily.     B Complex Vitamins (VITAMIN B COMPLEX) TABS Take 1 tablet by mouth daily.     betamethasone dipropionate (DIPROLENE) 0.05 % cream Apply 1 application topically 2 (two) times daily as needed (for psoriasis).   3   budesonide-formoterol (SYMBICORT) 160-4.5 MCG/ACT inhaler Inhale 2 puffs into the lungs 2 (two) times daily.      calcium-vitamin D (OSCAL WITH D) 500-200 MG-UNIT tablet Take 1 tablet by mouth.     Cyanocobalamin (VITAMIN B 12 PO) Take by mouth daily.     leuprolide, 6 Month, (LEUPROLIDE ACETATE, 6 MONTH,) 45 MG injection See admin instructions.     lidocaine-prilocaine (EMLA) cream Apply 1 application topically as needed. 30 g 0   megestrol (MEGACE ES) 625 MG/5ML suspension Take 5 mLs (625 mg total) by mouth daily. 150 mL 0   metoprolol tartrate (LOPRESSOR) 50 MG tablet Take one tablet two hours prior to the test 1 tablet 0   predniSONE (DELTASONE) 5 MG tablet Take 5 mg by mouth daily.     prochlorperazine (COMPAZINE) 10  MG tablet Take 1 tablet (10 mg total) by mouth every 6 (six) hours as needed for nausea or vomiting. 60 tablet 3   pseudoephedrine-acetaminophen (TYLENOL SINUS) 30-500 MG TABS tablet Take 1 tablet by mouth every 4 (four) hours as needed.     triamcinolone cream (KENALOG) 0.1 % Apply 1 application topically 2 (two) times daily as needed (for psoriasis).  2   VENTOLIN HFA 108 (90 Base) MCG/ACT inhaler INHALE 1 PUFF BY MOUTH EVERY 4 HOURS AS NEEDED 1 each 0   No current facility-administered medications for this visit.     Allergies:  Allergies  Allergen Reactions   Atorvastatin     Other reaction(s): Myalgias       Physical Exam:  Blood pressure 135/71, pulse 79, temperature 97.9 F (36.6 C), temperature source Axillary, resp. rate 17, height 6' (1.829 m), weight 156 lb 8 oz (71 kg), SpO2 100 %.    ECOG: 1   General appearance: Comfortable appearing without any discomfort Head: Normocephalic without any trauma Oropharynx: Mucous membranes are moist and pink without any thrush or ulcers. Eyes: Pupils are equal and round reactive to light. Lymph nodes: No cervical, supraclavicular, inguinal or axillary lymphadenopathy.   Heart:regular rate and rhythm.  S1 and S2 without leg edema. Lung: Clear without any rhonchi or wheezes.  No dullness to percussion. Abdomin: Soft, nontender, nondistended with good bowel sounds.  No hepatosplenomegaly. Musculoskeletal: No joint deformity or effusion.  Full range of motion noted. Neurological: No deficits noted on motor, sensory and deep tendon reflex exam. Skin: No petechial rash or dryness.  Appeared moist.      Lab Results: Lab Results  Component Value Date   WBC 7.8 12/17/2021   HGB 8.4 (L) 12/17/2021   HCT 25.4 (L) 12/17/2021   MCV 90.7 12/17/2021   PLT 363 12/17/2021     Chemistry      Component Value Date/Time   NA 137 12/17/2021 0936   K 4.4 12/17/2021 0936   CL 101 12/17/2021 0936   CO2 28 12/17/2021 0936   BUN 20 12/17/2021 0936   CREATININE 0.89 12/17/2021 0936      Component Value Date/Time   CALCIUM 9.1 12/17/2021 0936   ALKPHOS 63 12/17/2021 0936   AST 28 12/17/2021 0936   ALT 19 12/17/2021 0936   BILITOT 0.4 12/17/2021 0936        Latest Reference Range & Units 11/05/21 11:11 11/26/21 08:47 12/17/21 09:36  Prostate Specific Ag, Serum 0.0 - 4.0 ng/mL 256.0 (H) 240.0 (H) 218.0 (H)  (H): Data is abnormally high    Impression and Plan:   76 year old with:  1.  Advanced prostate cancer with lymphadenopathy diagnosed in 2020.  He developed castration-resistant disease at this time.  Risks and benefits of  continuing Taxotere chemotherapy were reiterated at this time.  His PSA is showing a slight decline after 4 cycles of treatment.  Complications include nausea, vomiting, myelosuppression, neutropenia possible sepsis were reiterated.  Alternative treatment options were also reviewed.  After discussion today, he is agreeable to proceed and will reduce the dose of Taxotere to 60 mg per metered square for better tolerance.  He has experienced myelosuppression as well as anorexia which could be problematic without any dose reduction moving forward.  2.  IV access: Port-A-Cath remains in use without any complications.  3.  Antiemetics: No nausea or vomiting reported at this time.  Compazine is available to him.  4.  Androgen deprivation therapy: I recommended continuing this indefinitely.  He is receiving that under the care of Dr. Junious Silk.  5.  Bone directed therapy: He remains on calcium and vitamin D supplements.   6.  Growth factor support: He will continue to receive growth factor support after each cycle of therapy.  7.  Constipation: Prescription for Senokot-S will be available to him.  8.  Anemia: Related to malignancy and chemotherapy.  We will continue to monitor and consider packed red cell transfusion if needed.  He is asymptomatic at this time and will defer transfusion.  9.  Weight loss: We discussed strategies to boost his appetite including prescription for Megace will be sent to his pharmacy.  10.  Follow-up: He will return in 3 weeks for the next cycle.   30  minutes were dedicated to this encounter.  The time was spent on reviewing laboratory data, disease status update and outlining future plan of care discussion.      Zola Button, MD 1/11/20238:49 AM

## 2022-01-07 NOTE — Progress Notes (Signed)
Ok to treat with elevated Scr today per Dr Alen Blew.

## 2022-01-07 NOTE — Patient Instructions (Signed)
[facility]   ?Discharge Instructions: ?Thank you for choosing [facility] to provide your oncology and hematology care.  ? ?If you have a lab appointment with the Cancer Center, please go directly to the Cancer Center and check in at the registration area. ?  ?Wear comfortable clothing and clothing appropriate for easy access to any Portacath or PICC line.  ? ?We strive to give you quality time with your provider. You may need to reschedule your appointment if you arrive late (15 or more minutes).  Arriving late affects you and other patients whose appointments are after yours.  Also, if you miss three or more appointments without notifying the office, you may be dismissed from the clinic at the provider?s discretion.    ?  ?For prescription refill requests, have your pharmacy contact our office and allow 72 hours for refills to be completed.   ? ?Today you received the following chemotherapy and/or immunotherapy agents: docetaxel    ?  ?To help prevent nausea and vomiting after your treatment, we encourage you to take your nausea medication as directed. ? ?BELOW ARE SYMPTOMS THAT SHOULD BE REPORTED IMMEDIATELY: ?*FEVER GREATER THAN 100.4 F (38 ?C) OR HIGHER ?*CHILLS OR SWEATING ?*NAUSEA AND VOMITING THAT IS NOT CONTROLLED WITH YOUR NAUSEA MEDICATION ?*UNUSUAL SHORTNESS OF BREATH ?*UNUSUAL BRUISING OR BLEEDING ?*URINARY PROBLEMS (pain or burning when urinating, or frequent urination) ?*BOWEL PROBLEMS (unusual diarrhea, constipation, pain near the anus) ?TENDERNESS IN MOUTH AND THROAT WITH OR WITHOUT PRESENCE OF ULCERS (sore throat, sores in mouth, or a toothache) ?UNUSUAL RASH, SWELLING OR PAIN  ?UNUSUAL VAGINAL DISCHARGE OR ITCHING  ? ?Items with * indicate a potential emergency and should be followed up as soon as possible or go to the Emergency Department if any problems should occur. ? ?Please show the CHEMOTHERAPY ALERT CARD or IMMUNOTHERAPY ALERT CARD at check-in  to the Emergency Department and triage nurse. ? ?Should you have questions after your visit or need to cancel or reschedule your appointment, please contact [facility]  Dept: [phone]  and follow the prompts.  Office hours are 8:00 a.m. to 4:30 p.m. Monday - Friday. Please note that voicemails left after 4:00 p.m. may not be returned until the following business day.  We are closed weekends and major holidays. You have access to a nurse at all times for urgent questions. Please call the main number to the clinic Dept: [phone] and follow the prompts. ? ? ?For any non-urgent questions, you may also contact your provider using MyChart. We now offer e-Visits for anyone 18 and older to request care online for non-urgent symptoms. For details visit [email or web address]. ?  ?Also download the MyChart app! Go to the app store, search "MyChart", open the app, select [facility], and log in with your MyChart username and password. ? ?Due to Covid, a mask is required upon entering the hospital/clinic. If you do not have a mask, one will be given to you upon arrival. For doctor visits, patients may have 1 support person aged 18 or older with them. For treatment visits, patients cannot have anyone with them due to current Covid guidelines and our immunocompromised population.  ? ?

## 2022-01-08 ENCOUNTER — Telehealth: Payer: Self-pay | Admitting: *Deleted

## 2022-01-08 LAB — PROSTATE-SPECIFIC AG, SERUM (LABCORP): Prostate Specific Ag, Serum: 217 ng/mL — ABNORMAL HIGH (ref 0.0–4.0)

## 2022-01-08 NOTE — Telephone Encounter (Signed)
-----   Message from Wyatt Portela, MD sent at 01/08/2022  8:42 AM EST ----- Please let him know his PSA. Slightly lower

## 2022-01-08 NOTE — Telephone Encounter (Signed)
PC to patient, informed him of PSA results - he verbalizes understanding.

## 2022-01-09 ENCOUNTER — Emergency Department (HOSPITAL_COMMUNITY): Payer: Medicare HMO

## 2022-01-09 ENCOUNTER — Ambulatory Visit (HOSPITAL_BASED_OUTPATIENT_CLINIC_OR_DEPARTMENT_OTHER): Admission: RE | Admit: 2022-01-09 | Payer: Medicare HMO | Source: Home / Self Care | Admitting: Urology

## 2022-01-09 ENCOUNTER — Encounter: Payer: Self-pay | Admitting: Oncology

## 2022-01-09 ENCOUNTER — Inpatient Hospital Stay (HOSPITAL_COMMUNITY)
Admission: EM | Admit: 2022-01-09 | Discharge: 2022-01-15 | DRG: 389 | Disposition: A | Discharge status: Home Health | Payer: Medicare HMO | Attending: Internal Medicine | Admitting: Internal Medicine

## 2022-01-09 ENCOUNTER — Encounter (HOSPITAL_COMMUNITY): Payer: Self-pay

## 2022-01-09 ENCOUNTER — Other Ambulatory Visit: Payer: Self-pay

## 2022-01-09 ENCOUNTER — Ambulatory Visit: Payer: Medicare HMO

## 2022-01-09 DIAGNOSIS — R109 Unspecified abdominal pain: Secondary | ICD-10-CM | POA: Diagnosis not present

## 2022-01-09 DIAGNOSIS — D630 Anemia in neoplastic disease: Secondary | ICD-10-CM | POA: Diagnosis present

## 2022-01-09 DIAGNOSIS — D701 Agranulocytosis secondary to cancer chemotherapy: Secondary | ICD-10-CM

## 2022-01-09 DIAGNOSIS — K56600 Partial intestinal obstruction, unspecified as to cause: Secondary | ICD-10-CM

## 2022-01-09 DIAGNOSIS — R519 Headache, unspecified: Secondary | ICD-10-CM | POA: Diagnosis not present

## 2022-01-09 DIAGNOSIS — K6389 Other specified diseases of intestine: Secondary | ICD-10-CM | POA: Diagnosis not present

## 2022-01-09 DIAGNOSIS — T451X5A Adverse effect of antineoplastic and immunosuppressive drugs, initial encounter: Secondary | ICD-10-CM

## 2022-01-09 DIAGNOSIS — Z743 Need for continuous supervision: Secondary | ICD-10-CM | POA: Diagnosis not present

## 2022-01-09 DIAGNOSIS — Z8042 Family history of malignant neoplasm of prostate: Secondary | ICD-10-CM | POA: Diagnosis not present

## 2022-01-09 DIAGNOSIS — C61 Malignant neoplasm of prostate: Secondary | ICD-10-CM | POA: Diagnosis not present

## 2022-01-09 DIAGNOSIS — R0689 Other abnormalities of breathing: Secondary | ICD-10-CM | POA: Diagnosis not present

## 2022-01-09 DIAGNOSIS — K5900 Constipation, unspecified: Secondary | ICD-10-CM | POA: Diagnosis present

## 2022-01-09 DIAGNOSIS — N1330 Unspecified hydronephrosis: Secondary | ICD-10-CM | POA: Diagnosis not present

## 2022-01-09 DIAGNOSIS — Z66 Do not resuscitate: Secondary | ICD-10-CM | POA: Diagnosis not present

## 2022-01-09 DIAGNOSIS — Z4659 Encounter for fitting and adjustment of other gastrointestinal appliance and device: Secondary | ICD-10-CM

## 2022-01-09 DIAGNOSIS — J9811 Atelectasis: Secondary | ICD-10-CM | POA: Diagnosis not present

## 2022-01-09 DIAGNOSIS — N139 Obstructive and reflux uropathy, unspecified: Secondary | ICD-10-CM

## 2022-01-09 DIAGNOSIS — C7982 Secondary malignant neoplasm of genital organs: Secondary | ICD-10-CM

## 2022-01-09 DIAGNOSIS — K56609 Unspecified intestinal obstruction, unspecified as to partial versus complete obstruction: Secondary | ICD-10-CM

## 2022-01-09 DIAGNOSIS — I2510 Atherosclerotic heart disease of native coronary artery without angina pectoris: Secondary | ICD-10-CM | POA: Diagnosis present

## 2022-01-09 DIAGNOSIS — J449 Chronic obstructive pulmonary disease, unspecified: Secondary | ICD-10-CM | POA: Diagnosis not present

## 2022-01-09 DIAGNOSIS — N179 Acute kidney failure, unspecified: Secondary | ICD-10-CM | POA: Diagnosis not present

## 2022-01-09 DIAGNOSIS — E875 Hyperkalemia: Secondary | ICD-10-CM | POA: Diagnosis not present

## 2022-01-09 DIAGNOSIS — Z4682 Encounter for fitting and adjustment of non-vascular catheter: Secondary | ICD-10-CM | POA: Diagnosis not present

## 2022-01-09 DIAGNOSIS — R0602 Shortness of breath: Secondary | ICD-10-CM | POA: Diagnosis not present

## 2022-01-09 DIAGNOSIS — R079 Chest pain, unspecified: Secondary | ICD-10-CM | POA: Diagnosis not present

## 2022-01-09 DIAGNOSIS — R0789 Other chest pain: Secondary | ICD-10-CM | POA: Diagnosis not present

## 2022-01-09 DIAGNOSIS — Z7951 Long term (current) use of inhaled steroids: Secondary | ICD-10-CM | POA: Diagnosis not present

## 2022-01-09 DIAGNOSIS — Z888 Allergy status to other drugs, medicaments and biological substances status: Secondary | ICD-10-CM | POA: Diagnosis not present

## 2022-01-09 DIAGNOSIS — I4510 Unspecified right bundle-branch block: Secondary | ICD-10-CM | POA: Diagnosis present

## 2022-01-09 DIAGNOSIS — K5669 Other partial intestinal obstruction: Secondary | ICD-10-CM | POA: Diagnosis not present

## 2022-01-09 DIAGNOSIS — C7951 Secondary malignant neoplasm of bone: Secondary | ICD-10-CM | POA: Diagnosis not present

## 2022-01-09 DIAGNOSIS — R404 Transient alteration of awareness: Secondary | ICD-10-CM | POA: Diagnosis not present

## 2022-01-09 DIAGNOSIS — Z192 Hormone resistant malignancy status: Secondary | ICD-10-CM | POA: Diagnosis present

## 2022-01-09 DIAGNOSIS — C772 Secondary and unspecified malignant neoplasm of intra-abdominal lymph nodes: Secondary | ICD-10-CM | POA: Diagnosis not present

## 2022-01-09 DIAGNOSIS — D649 Anemia, unspecified: Secondary | ICD-10-CM

## 2022-01-09 DIAGNOSIS — E8809 Other disorders of plasma-protein metabolism, not elsewhere classified: Secondary | ICD-10-CM | POA: Diagnosis not present

## 2022-01-09 DIAGNOSIS — Z79899 Other long term (current) drug therapy: Secondary | ICD-10-CM | POA: Diagnosis not present

## 2022-01-09 DIAGNOSIS — K449 Diaphragmatic hernia without obstruction or gangrene: Secondary | ICD-10-CM | POA: Diagnosis not present

## 2022-01-09 DIAGNOSIS — Z20822 Contact with and (suspected) exposure to covid-19: Secondary | ICD-10-CM | POA: Diagnosis not present

## 2022-01-09 DIAGNOSIS — N131 Hydronephrosis with ureteral stricture, not elsewhere classified: Secondary | ICD-10-CM | POA: Diagnosis not present

## 2022-01-09 DIAGNOSIS — I700 Atherosclerosis of aorta: Secondary | ICD-10-CM | POA: Diagnosis not present

## 2022-01-09 DIAGNOSIS — U071 COVID-19: Secondary | ICD-10-CM | POA: Diagnosis not present

## 2022-01-09 LAB — CBC WITH DIFFERENTIAL/PLATELET
Abs Immature Granulocytes: 0.06 10*3/uL (ref 0.00–0.07)
Basophils Absolute: 0 10*3/uL (ref 0.0–0.1)
Basophils Relative: 0 %
Eosinophils Absolute: 0 10*3/uL (ref 0.0–0.5)
Eosinophils Relative: 0 %
HCT: 25.7 % — ABNORMAL LOW (ref 39.0–52.0)
Hemoglobin: 8.2 g/dL — ABNORMAL LOW (ref 13.0–17.0)
Immature Granulocytes: 1 %
Lymphocytes Relative: 3 %
Lymphs Abs: 0.2 10*3/uL — ABNORMAL LOW (ref 0.7–4.0)
MCH: 29.8 pg (ref 26.0–34.0)
MCHC: 31.9 g/dL (ref 30.0–36.0)
MCV: 93.5 fL (ref 80.0–100.0)
Monocytes Absolute: 0.2 10*3/uL (ref 0.1–1.0)
Monocytes Relative: 3 %
Neutro Abs: 6.6 10*3/uL (ref 1.7–7.7)
Neutrophils Relative %: 93 %
Platelets: 247 10*3/uL (ref 150–400)
RBC: 2.75 MIL/uL — ABNORMAL LOW (ref 4.22–5.81)
RDW: 20.7 % — ABNORMAL HIGH (ref 11.5–15.5)
WBC: 7 10*3/uL (ref 4.0–10.5)
nRBC: 0 % (ref 0.0–0.2)

## 2022-01-09 LAB — COMPREHENSIVE METABOLIC PANEL
ALT: 15 U/L (ref 0–44)
AST: 98 U/L — ABNORMAL HIGH (ref 15–41)
Albumin: 3.1 g/dL — ABNORMAL LOW (ref 3.5–5.0)
Alkaline Phosphatase: 54 U/L (ref 38–126)
Anion gap: 10 (ref 5–15)
BUN: 31 mg/dL — ABNORMAL HIGH (ref 8–23)
CO2: 20 mmol/L — ABNORMAL LOW (ref 22–32)
Calcium: 9.1 mg/dL (ref 8.9–10.3)
Chloride: 104 mmol/L (ref 98–111)
Creatinine, Ser: 1.66 mg/dL — ABNORMAL HIGH (ref 0.61–1.24)
GFR, Estimated: 42 mL/min — ABNORMAL LOW (ref >60)
Glucose, Bld: 94 mg/dL (ref 70–99)
Potassium: 5.3 mmol/L — ABNORMAL HIGH (ref 3.5–5.1)
Sodium: 134 mmol/L — ABNORMAL LOW (ref 135–145)
Total Bilirubin: 0.8 mg/dL (ref 0.3–1.2)
Total Protein: 6 g/dL — ABNORMAL LOW (ref 6.5–8.1)

## 2022-01-09 LAB — TROPONIN I (HIGH SENSITIVITY)
Troponin I (High Sensitivity): 9 ng/L (ref <18)
Troponin I (High Sensitivity): 8 ng/L (ref <18)

## 2022-01-09 LAB — RESP PANEL BY RT-PCR (FLU A&B, COVID) ARPGX2
Influenza A by PCR: NEGATIVE
Influenza B by PCR: NEGATIVE
SARS Coronavirus 2 by RT PCR: NEGATIVE

## 2022-01-09 SURGERY — TURBT (TRANSURETHRAL RESECTION OF BLADDER TUMOR)
Anesthesia: General

## 2022-01-09 MED ORDER — ACETAMINOPHEN 650 MG RE SUPP: 650.0000 mg | Freq: Four times a day (QID) | RECTAL | Status: DC | PRN

## 2022-01-09 MED ORDER — ONDANSETRON HCL 4 MG/2ML IJ SOLN
4.0000 mg | Freq: Once | INTRAMUSCULAR | Status: AC
  Administered 2022-01-09: 4 mg via INTRAVENOUS
  Filled 2022-01-09: qty 2

## 2022-01-09 MED ORDER — MOMETASONE FURO-FORMOTEROL FUM 200-5 MCG/ACT IN AERO
2.0000 | INHALATION_SPRAY | Freq: Two times a day (BID) | RESPIRATORY_TRACT | Status: DC
  Administered 2022-01-10 – 2022-01-15 (×10): 2 via RESPIRATORY_TRACT
  Filled 2022-01-09 (×2): qty 8.8

## 2022-01-09 MED ORDER — IOHEXOL 350 MG/ML SOLN
60.0000 mL | Freq: Once | INTRAVENOUS | Status: AC | PRN
  Administered 2022-01-09: 60 mL via INTRAVENOUS

## 2022-01-09 MED ORDER — HYDROMORPHONE HCL 1 MG/ML IJ SOLN
0.5000 mg | INTRAMUSCULAR | Status: DC | PRN
  Administered 2022-01-10: 0.5 mg via INTRAVENOUS
  Filled 2022-01-09: qty 1

## 2022-01-09 MED ORDER — SODIUM CHLORIDE 0.9 % IV BOLUS
1000.0000 mL | Freq: Once | INTRAVENOUS | Status: AC
  Administered 2022-01-09: 1000 mL via INTRAVENOUS

## 2022-01-09 MED ORDER — ACETAMINOPHEN 325 MG PO TABS: 650.0000 mg | ORAL_TABLET | Freq: Four times a day (QID) | ORAL | Status: DC | PRN

## 2022-01-09 MED ORDER — SODIUM CHLORIDE 0.9 % IV SOLN: INTRAVENOUS | Status: DC

## 2022-01-09 MED ORDER — MORPHINE SULFATE (PF) 4 MG/ML IV SOLN
4.0000 mg | INTRAVENOUS | Status: DC | PRN
Start: 2022-01-09 — End: 2022-01-09

## 2022-01-09 MED ORDER — ONDANSETRON HCL 4 MG/2ML IJ SOLN
4.0000 mg | Freq: Four times a day (QID) | INTRAMUSCULAR | Status: DC | PRN
  Administered 2022-01-10 – 2022-01-11 (×3): 4 mg via INTRAVENOUS
  Filled 2022-01-09 (×3): qty 2

## 2022-01-09 MED ORDER — NALOXONE HCL 0.4 MG/ML IJ SOLN: 0.4000 mg | INTRAMUSCULAR | Status: DC | PRN

## 2022-01-09 MED ORDER — ALBUTEROL SULFATE (2.5 MG/3ML) 0.083% IN NEBU: 2.5000 mg | INHALATION_SOLUTION | RESPIRATORY_TRACT | Status: DC | PRN

## 2022-01-09 MED ORDER — MORPHINE SULFATE (PF) 4 MG/ML IV SOLN
4.0000 mg | Freq: Once | INTRAVENOUS | Status: AC
  Administered 2022-01-09: 4 mg via INTRAVENOUS
  Filled 2022-01-09: qty 1

## 2022-01-09 NOTE — ED Notes (Signed)
Patient transported to CT 

## 2022-01-09 NOTE — ED Triage Notes (Signed)
Generalized feeling unwellness,started new med, underwent chemo tx Wednesday.  C/o SOB and CP, hx COPD, lungs clear, 99% RA, better now. For CP 324 mg ASA and .4 ntg sl, with improvement sharp central CP, poor appetite and PO intake last few days. Enroute became unresponsive with right side gaze and period apnea for approx. 1.5 minutes, BP and HR remained normal.

## 2022-01-09 NOTE — Consult Note (Signed)
Tucker Minter Cannon Kettle 1946-04-12  [id number].    Requesting MD: Evlyn Courier Chief Complaint/Reason for Consult: partial bowel obstruction  HPI:  Mr. Feil is a 76 yo male with metastatic prostate cancer, for which he is currently undergoing chemotherapy. He presented to the ED today with chest pain and shortness of breath, which began last night. He was reportedly unresponsive en route to the ED. Cardiac workup was unremarkable, and a CT PE was done. This did not show a PE, however hydronephrosis was noted so an abdominal CT without contrast was done. This was read as a colonic obstruction secondary to an internal hernia.  On my exam the patient denies any abdominal pain, and says he has not recently had any abdominal pain. He reports a few days around Christmas where he was constipated, but took milk of magnesia and since then has been having regular bowel movements. He does not have any nausea or vomiting. He has not had any prior abdominal surgeries. He is not sure when he last had a colonoscopy.  ROS: Review of Systems  Constitutional:  Negative for chills and fever.  Respiratory:  Positive for shortness of breath.   Cardiovascular:  Positive for chest pain.  Gastrointestinal:  Positive for constipation. Negative for abdominal pain, nausea and vomiting.   Family History  Problem Relation Age of Onset   Prostate cancer Brother    Cancer Maternal Uncle        unknown type    Past Medical History:  Diagnosis Date   COPD (chronic obstructive pulmonary disease) ([place])    Prostate cancer ([place])     Past Surgical History:  Procedure Laterality Date   IR IMAGING GUIDED PORT INSERTION  10/09/2021   RADIOACTIVE SEED IMPLANT  2011   TONSILLECTOMY     TRANSURETHRAL RESECTION OF BLADDER TUMOR N/A 12/15/2018   Procedure: CYSTOSCOPY TRANSURETHRAL RESECTION OF BLADDER TUMOR (TURBT);  Surgeon: Irine Seal, MD;  Location: [facility];  Service: Urology;  Laterality: N/A;    Social History:   reports that he has never smoked. He has never used smokeless tobacco. He reports current alcohol use of about 1.0 standard drink per week. He reports that he does not use drugs.  Allergies:  Allergies  Allergen Reactions   Atorvastatin     Other reaction(s): Myalgias    (Not in a hospital admission)    Physical Exam: Blood pressure 123/81, pulse (!) 104, temperature 97.9 F (36.6 C), temperature source Oral, resp. rate 13, SpO2 97 %. General: resting comfortably, appears stated age, no apparent distress Neurological: alert and oriented, no focal deficits, cranial nerves grossly in tact HEENT: normocephalic, atraumatic, oropharynx clear, no scleral icterus CV: regular rate and rhythm, extremities warm and well-perfused Respiratory: normal work of breathing on room air, symmetric chest wall expansion Abdomen: soft, nondistended, nontender to deep palpation. No masses or organomegaly. No surgical scars. Extremities: warm and well-perfused, no deformities, moving all extremities spontaneously Psychiatric: normal mood and affect Skin: warm and dry, no jaundice, no rashes or lesions   Results for orders placed or performed during the hospital encounter of 01/09/22 (from the past 48 hour(s))  Resp Panel by RT-PCR (Flu A&B, Covid) Nasopharyngeal Swab     Status: None   Collection Time: 01/09/22  2:46 PM   Specimen: Nasopharyngeal Swab; Nasopharyngeal(NP) swabs in vial transport medium  Result Value Ref Range   SARS Coronavirus 2 by RT PCR NEGATIVE NEGATIVE    Comment: (NOTE) SARS-CoV-2 target nucleic acids are NOT DETECTED.  The SARS-CoV-2 RNA is generally detectable in upper respiratory specimens during the acute phase of infection. The lowest concentration of SARS-CoV-2 viral copies this assay can detect is 138 copies/mL. A negative result does not preclude SARS-Cov-2 infection and should not be used as the sole basis for treatment or other patient management decisions. A negative  result may occur with  improper specimen collection/handling, submission of specimen other than nasopharyngeal swab, presence of viral mutation(s) within the areas targeted by this assay, and inadequate number of viral copies(<138 copies/mL). A negative result must be combined with clinical observations, patient history, and epidemiological information. The expected result is Negative.  Fact Sheet for Patients:  EntrepreneurPulse.com.au  Fact Sheet for Healthcare Providers:  IncredibleEmployment.be  This test is no t yet approved or cleared by the [place] FDA and  has been authorized for detection and/or diagnosis of SARS-CoV-2 by FDA under an Emergency Use Authorization (EUA). This EUA will remain  in effect (meaning this test can be used) for the duration of the COVID-19 declaration under Section 564(b)(1) of the Act, 21 U.S.C.section 360bbb-3(b)(1), unless the authorization is terminated  or revoked sooner.       Influenza A by PCR NEGATIVE NEGATIVE   Influenza B by PCR NEGATIVE NEGATIVE    Comment: (NOTE) The Xpert Xpress SARS-CoV-2/FLU/RSV plus assay is intended as an aid in the diagnosis of influenza from Nasopharyngeal swab specimens and should not be used as a sole basis for treatment. Nasal washings and aspirates are unacceptable for Xpert Xpress SARS-CoV-2/FLU/RSV testing.  Fact Sheet for Patients: EntrepreneurPulse.com.au  Fact Sheet for Healthcare Providers: IncredibleEmployment.be  This test is not yet approved or cleared by the [place] FDA and has been authorized for detection and/or diagnosis of SARS-CoV-2 by FDA under an Emergency Use Authorization (EUA). This EUA will remain in effect (meaning this test can be used) for the duration of the COVID-19 declaration under Section 564(b)(1) of the Act, 21 U.S.C. section 360bbb-3(b)(1), unless the authorization is terminated  or revoked.  Performed at [facility], [place] [address] [place], [place] [address]   CBC with Differential     Status: Abnormal   Collection Time: 01/09/22  3:40 PM  Result Value Ref Range   WBC 7.0 4.0 - 10.5 K/uL   RBC 2.75 (L) 4.22 - 5.81 MIL/uL   Hemoglobin 8.2 (L) 13.0 - 17.0 g/dL   HCT 25.7 (L) 39.0 - 52.0 %   MCV 93.5 80.0 - 100.0 fL   MCH 29.8 26.0 - 34.0 pg   MCHC 31.9 30.0 - 36.0 g/dL   RDW 20.7 (H) 11.5 - 15.5 %   Platelets 247 150 - 400 K/uL   nRBC 0.0 0.0 - 0.2 %   Neutrophils Relative % 93 %   Neutro Abs 6.6 1.7 - 7.7 K/uL   Lymphocytes Relative 3 %   Lymphs Abs 0.2 (L) 0.7 - 4.0 K/uL   Monocytes Relative 3 %   Monocytes Absolute 0.2 0.1 - 1.0 K/uL   Eosinophils Relative 0 %   Eosinophils Absolute 0.0 0.0 - 0.5 K/uL   Basophils Relative 0 %   Basophils Absolute 0.0 0.0 - 0.1 K/uL   Immature Granulocytes 1 %   Abs Immature Granulocytes 0.06 0.00 - 0.07 K/uL    Comment: Performed at [facility], [place] [address] [place], [place] [address]  Comprehensive metabolic panel     Status: Abnormal   Collection Time: 01/09/22  3:40 PM  Result Value Ref Range  Sodium 134 (L) 135 - 145 mmol/L   Potassium 5.3 (H) 3.5 - 5.1 mmol/L   Chloride 104 98 - 111 mmol/L   CO2 20 (L) 22 - 32 mmol/L   Glucose, Bld 94 70 - 99 mg/dL    Comment: Glucose reference range applies only to samples taken after fasting for at least 8 hours.   BUN 31 (H) 8 - 23 mg/dL   Creatinine, Ser 1.66 (H) 0.61 - 1.24 mg/dL   Calcium 9.1 8.9 - 10.3 mg/dL   Total Protein 6.0 (L) 6.5 - 8.1 g/dL   Albumin 3.1 (L) 3.5 - 5.0 g/dL   AST 98 (H) 15 - 41 U/L   ALT 15 0 - 44 U/L   Alkaline Phosphatase 54 38 - 126 U/L   Total Bilirubin 0.8 0.3 - 1.2 mg/dL   GFR, Estimated 42 (L) >60 mL/min    Comment: (NOTE) Calculated using the CKD-EPI Creatinine Equation (2021)    Anion gap 10 5 - 15    Comment: Performed at [facility], [place] [address], [place], [place] [address]  Troponin I (High  Sensitivity)     Status: None   Collection Time: 01/09/22  3:40 PM  Result Value Ref Range   Troponin I (High Sensitivity) 9 <18 ng/L    Comment: (NOTE) Elevated high sensitivity troponin I (hsTnI) values and significant  changes across serial measurements may suggest ACS but many other  chronic and acute conditions are known to elevate hsTnI results.  Refer to the "Links" section for chest pain algorithms and additional  guidance. Performed at [facility], [place] [address] [place], [place] [address]   Troponin I (High Sensitivity)     Status: None   Collection Time: 01/09/22  4:47 PM  Result Value Ref Range   Troponin I (High Sensitivity) 8 <18 ng/L    Comment: (NOTE) Elevated high sensitivity troponin I (hsTnI) values and significant  changes across serial measurements may suggest ACS but many other  chronic and acute conditions are known to elevate hsTnI results.  Refer to the "Links" section for chest pain algorithms and additional  guidance. Performed at [facility], [place] [address] [place], [place] [address]    CT ABDOMEN PELVIS WO CONTRAST  Result Date: 01/09/2022 CLINICAL DATA:  Abdominal pain, acute, nonlocalized evaluate for obstruction. bilateral hydronephrosis on CTA. EXAM: CT ABDOMEN AND PELVIS WITHOUT CONTRAST TECHNIQUE: Multidetector CT imaging of the abdomen and pelvis was performed following the standard protocol without IV contrast. RADIATION DOSE REDUCTION: This exam was performed according to the departmental dose-optimization program which includes automated exposure control, adjustment of the mA and/or kV according to patient size and/or use of iterative reconstruction technique. COMPARISON:  10/04/2019 FINDINGS: Lower chest: No acute abnormality. Moderate coronary artery calcification. Small hiatal hernia. Hepatobiliary: No focal liver abnormality is seen. No gallstones, gallbladder wall thickening, or biliary dilatation. Pancreas: Unremarkable Spleen:  Unremarkable Adrenals/Urinary Tract: Dense calcification within the right adrenal gland is in keeping with prior infarction and/or hemorrhage. Left adrenal gland is unremarkable. The kidneys are normal in size and position. There is moderate bilateral hydronephrosis, new from prior PET CT examination of 09/19/2021. Left ureteral distension extends to the level of the a lobulated mass within the left hemipelvis, best seen on image # 60/4. Right ureteral obstruction extends to the level of the distal right ureter just proximal to the right ureterovesicular junction. A discrete obstructing mass is not clearly identified in this location. There is,  however, asymmetric bladder wall thickening in the region of the right ureterovesicular junction and circumferential thickening of the distal right ureter which may reflect malignant infiltration in this location, best seen on coronal image # 57-58/7. Additionally, there is a lobulated mural mass within the dome of the bladder measuring roughly 3.9 x 2.8 x 6.3 cm in greatest dimension, best seen on axial image # 70/4. Stomach/Bowel: The transverse colon is redundant. There is an internal hernia present with multiple loops of small bowel extending above the mid transverse colon into the right anterior abdomen. This results in a partial obstruction of the transverse colon with large volume stool proximal to the point of obstruction secondary to mass effect. This is best seen on axial image # 39/4 and coronal image # 23-27. The stomach and small bowel are unremarkable. The appendix is not clearly identified and may be absent. No free intraperitoneal gas or fluid. Vascular/Lymphatic: As noted above, there is a soft tissue mass within the left perirectal soft tissues measuring 2.7 x 6.1 cm on axial image # 65/4 possibly representing a nodal conglomerate given the findings within the bladder. There is, additionally, pathologic right retrocrural, left periaortic, right pericaval,  left pelvic sidewall and right external iliac lymphadenopathy with the index lymph node measuring 3.2 x 3.5 cm at axial image # 69/4. Moderate aortoiliac atherosclerotic calcification. No aortic aneurysm. Reproductive: Brachytherapy seeds are seen within the prostate gland. Other: No abdominal wall hernia. Musculoskeletal: Widespread sclerotic metastases are seen within the visualized axial skeleton, significantly progressed since prior examination. No pathologic fracture identified. IMPRESSION: Interval development of a moderate bilateral hydronephrosis secondary to probable malignant infiltration of the distal right ureter and extrinsic compression of the distal left ureter secondary to perirectal soft tissue possibly representing a metastatic nodal conglomerate. Interval development of asymmetric wall thickening with a superimposed intraluminal mural mass within the bladder suspicious for a primary bladder malignancy. This would be better assessed with cystoscopy. Less likely, this may represent a metastatic mass related to the patient's underlying prostate malignancy. Widespread retroperitoneal metastatic adenopathy, progressive since prior PET CT examination. This can be seen both in the setting of metastatic bladder and metastatic prostate cancer, however, its presence on prior PET CT examination suggests that this may reflect progressive disease related to the patient's metastatic prostate cancer. Right external iliac pathologic lymphadenopathy would be amenable to percutaneous ultrasound-guided biopsy if clinically indicated. Widespread osteoblastic metastases, significantly progressed since prior examination, most in keeping with progressive metastatic prostate cancer. Aortic Atherosclerosis (ICD10-I70.0). Electronically Signed   By: Fidela Salisbury M.D.   On: 01/09/2022 17:02   CT Head Wo Contrast  Result Date: 01/09/2022 CLINICAL DATA:  Headache, prostate cancer currently undergoing chemotherapy, began  new medication with generalized feeling of unwellness, episode of unresponsiveness and right gaze, apnea EXAM: CT HEAD WITHOUT CONTRAST TECHNIQUE: Contiguous axial images were obtained from the base of the skull through the vertex without intravenous contrast. RADIATION DOSE REDUCTION: This exam was performed according to the departmental dose-optimization program which includes automated exposure control, adjustment of the mA and/or kV according to patient size and/or use of iterative reconstruction technique. COMPARISON:  None. FINDINGS: Brain: No acute infarct or hemorrhage. Lateral ventricles and midline structures are unremarkable. No acute extra-axial fluid collections. No mass effect. Vascular: No hyperdense vessel or unexpected calcification. Skull: Normal. Negative for fracture or focal lesion. Sinuses/Orbits: Partial opacification of the ethmoid air cells. Remaining paranasal sinuses are clear. Other: None. IMPRESSION: 1. No acute intracranial process. Electronically Signed  By: Randa Ngo M.D.   On: 01/09/2022 15:41   CT Angio Chest PE W and/or Wo Contrast  Result Date: 01/09/2022 CLINICAL DATA:  Shortness of breath, chest pain. History of metastatic prostate cancer. EXAM: CT ANGIOGRAPHY CHEST WITH CONTRAST TECHNIQUE: Multidetector CT imaging of the chest was performed using the standard protocol during bolus administration of intravenous contrast. Multiplanar CT image reconstructions and MIPs were obtained to evaluate the vascular anatomy. RADIATION DOSE REDUCTION: This exam was performed according to the departmental dose-optimization program which includes automated exposure control, adjustment of the mA and/or kV according to patient size and/or use of iterative reconstruction technique. CONTRAST:  67mL OMNIPAQUE IOHEXOL 350 MG/ML SOLN COMPARISON:  July 29, 2006. FINDINGS: Cardiovascular: Satisfactory opacification of the pulmonary arteries to the segmental level. No evidence of pulmonary  embolism. Normal heart size. No pericardial effusion. Atherosclerosis of thoracic aorta is noted. Mediastinum/Nodes: Small sliding-type hiatal hernia. No adenopathy is noted. Thyroid gland is unremarkable. Lungs/Pleura: Lungs are clear. No pleural effusion or pneumothorax. Upper Abdomen: There appears to be mild bilateral hydronephrosis. Musculoskeletal: Sclerotic densities are noted throughout the spine and ribs consistent with osseous metastatic disease. Review of the MIP images confirms the above findings. IMPRESSION: No definite evidence of pulmonary embolus. Continued presence of diffuse osseous metastases. Probable bilateral hydronephrosis is seen in the visualized portion of the upper abdomen. Small sliding-type hiatal hernia. Aortic Atherosclerosis (ICD10-I70.0). Electronically Signed   By: Marijo Conception M.D.   On: 01/09/2022 15:42   DG Chest Portable 1 View  Result Date: 01/09/2022 CLINICAL DATA:  Chest pain. EXAM: PORTABLE CHEST 1 VIEW COMPARISON:  December 23, 2018. FINDINGS: The heart size and mediastinal contours are within normal limits. Right internal jugular Port-A-Cath is noted with distal tip in expected position of the SVC. Mild bibasilar subsegmental atelectasis is noted. The visualized skeletal structures are unremarkable. IMPRESSION: Mild bibasilar subsegmental atelectasis. Electronically Signed   By: Marijo Conception M.D.   On: 01/09/2022 15:01      Assessment/Plan 76 yo male with metastatic prostate cancer, presenting with shortness of breath. General surgery was consulted due to concern for a colonic obstructed incidentally noted on CT. I reviewed the CT scan, which shows redundance of the colon with a significant stool burden. There is a change in caliber of the colon at the splenic flexure, but no obvious mass lesions. There are some loops of small bowel in the right upper quadrant, although an internal hernia would be extremely unusual in the absence of a prior bowel resection.  In addition, internal hernias tend to cause obstruction of the small bowel and are usually associated with significant abdominal pain. In the absence of obstructive symptoms and abdominal pain, this does not appear to be an acute process. The patient is being admitted for workup of his shortness of breath. I would recommend a good bowel regimen as he does have a large amount of stool in the colon. We will follow and re-examine in the morning.   Michaelle Birks, [facility], Hepatobiliary and Pancreatic Surgery 01/09/22 8:00 PM

## 2022-01-09 NOTE — ED Notes (Signed)
Got patient fully undressed into a gown on the monitor did ekg shown to Dr Matilde Sprang patient is resting with call bell in reach and nurse at bedside

## 2022-01-09 NOTE — ED Provider Notes (Signed)
[facility] EMERGENCY DEPARTMENT Provider Note   CSN: [id number] Arrival date & time: 01/09/22  1410     History  Chief Complaint  Patient presents with   Chest Pain    Jose Wolfe is a 76 y.o. male.  76 year old male with past medical history of prostate cancer on chemotherapy presents today for evaluation of chest pain, and shortness of breath onset last night.  Patient reports he recently had chemo session on Wednesday which she gets every 3 weeks and this was his fifth treatment.  He reports he tolerated first 3 treatments well in the past 2 treatments he has not felt well.  Also in route with EMS patient had episode of unresponsiveness without loss of pulse but he was apneic requiring BVM and EMS also reported he had a rightward gaze.  Chest pain is substernal and pleuritic in nature.  Patient is also exhibiting pursed lip breathing, but states he is a mouth breather at baseline.  Denies abdominal pain, lightheadedness, diaphoresis, or palpitations.  He was given 324 mg aspirin, and sublingual nitro in route.  The history is provided by the patient. No language interpreter was used.      Home Medications Prior to Admission medications   Medication Sig Start Date End Date Taking? Authorizing Provider  B Complex Vitamins (VITAMIN B COMPLEX) TABS Take 1 tablet by mouth daily.    [provider]  betamethasone dipropionate (DIPROLENE) 0.05 % cream Apply 1 application topically 2 (two) times daily as needed (for psoriasis).  10/28/18   [provider]  budesonide-formoterol (SYMBICORT) 160-4.5 MCG/ACT inhaler Inhale 2 puffs into the lungs 2 (two) times daily.     [provider]  calcium-vitamin D (OSCAL WITH D) 500-200 MG-UNIT tablet Take 1 tablet by mouth.    [provider]  Cyanocobalamin (VITAMIN B 12 PO) Take by mouth daily.    [provider]  leuprolide, 6 Month, (LEUPROLIDE ACETATE, 6 MONTH,) 45 MG  injection See admin instructions.    [provider]  lidocaine-prilocaine (EMLA) cream Apply 1 application topically as needed. 09/23/21   Wyatt Portela, MD  megestrol (MEGACE ES) 625 MG/5ML suspension Take 5 mLs (625 mg total) by mouth daily. 01/02/22   Wyatt Portela, MD  metoprolol tartrate (LOPRESSOR) 50 MG tablet Take one tablet two hours prior to the test 10/31/21   Croitoru, Mihai, MD  predniSONE (DELTASONE) 5 MG tablet Take 5 mg by mouth daily. 06/02/21   [provider]  prochlorperazine (COMPAZINE) 10 MG tablet Take 1 tablet (10 mg total) by mouth every 6 (six) hours as needed for nausea or vomiting. 11/05/21   Wyatt Portela, MD  pseudoephedrine-acetaminophen (TYLENOL SINUS) 30-500 MG TABS tablet Take 1 tablet by mouth every 4 (four) hours as needed.    [provider]  senna-docusate (SENNA S) 8.6-50 MG tablet Take 1 tablet by mouth at bedtime as needed for mild constipation. 01/07/22   Wyatt Portela, MD  triamcinolone cream (KENALOG) 0.1 % Apply 1 application topically 2 (two) times daily as needed (for psoriasis). 10/28/18   [provider]  VENTOLIN HFA 108 (90 Base) MCG/ACT inhaler INHALE 1 PUFF BY MOUTH EVERY 4 HOURS AS NEEDED 09/10/21   Croitoru, Mihai, MD      Allergies    Atorvastatin    Review of Systems   Review of Systems  Constitutional:  Negative for activity change, chills and fever.  Eyes:  Negative for visual disturbance.  Respiratory:  Positive for shortness of breath. Negative for cough and wheezing.   Cardiovascular:  Positive for chest pain. Negative for palpitations and leg swelling.  Gastrointestinal:  Negative for abdominal pain, nausea and vomiting.  Neurological:  Negative for weakness, light-headedness and headaches.  All other systems reviewed and are negative.  Physical Exam Updated Vital Signs BP (!) 141/82 (BP Location: Right Arm)    Pulse 94    Temp 97.9 F (36.6 C) (Oral)    Resp 18    SpO2 99%  Physical  Exam Vitals and nursing note reviewed.  Constitutional:      General: He is not in acute distress.    Appearance: Normal appearance. He is not ill-appearing.  HENT:     Head: Normocephalic and atraumatic.     Nose: Nose normal.  Eyes:     General: No scleral icterus.    Extraocular Movements: Extraocular movements intact.     Conjunctiva/sclera: Conjunctivae normal.  Cardiovascular:     Rate and Rhythm: Normal rate and regular rhythm.     Pulses:          Carotid pulses are 2+ on the right side and 2+ on the left side.      Radial pulses are 2+ on the right side and 2+ on the left side.       Dorsalis pedis pulses are 2+ on the right side and 2+ on the left side.     Heart sounds: Normal heart sounds.  Pulmonary:     Effort: Pulmonary effort is normal. No respiratory distress.     Breath sounds: Normal breath sounds. No wheezing or rales.  Abdominal:     General: There is no distension.     Tenderness: There is no abdominal tenderness.  Musculoskeletal:        General: Normal range of motion.     Cervical back: Normal range of motion.     Right lower leg: No edema.     Left lower leg: No edema.  Skin:    General: Skin is warm and dry.  Neurological:     General: No focal deficit present.     Mental Status: He is alert. Mental status is at baseline.    ED Results / Procedures / Treatments   Labs (all labs ordered are listed, but only abnormal results are displayed) Labs Reviewed  CBC WITH DIFFERENTIAL/PLATELET  COMPREHENSIVE METABOLIC PANEL  TROPONIN I (HIGH SENSITIVITY)    EKG None  Radiology No results found.  Procedures Procedures    Medications Ordered in ED Medications - No data to display  ED Course/ Medical Decision Making/ A&P Clinical Course as of 01/09/22 1927  Ludwig Clarks Jan 09, 2022  1850 Spoke with urologist and general surgeon regarding patient's ureter obstruction, concern for new primary bladder malignancy.  Urology recommends medicine admission  and they follow for obstruction and AKI.  General surgery will follow along for partial bowel obstruction. [AA]    Clinical Course User Index [AA] Evlyn Courier, PA-C                           Medical Decision Making / ED Course   This patient presents to the ED for concern of chest pain and shortness of breath, and in route had an episode of unresponsiveness this involves an extensive number of treatment options, and is a complaint that carries with it a high risk of complications and morbidity.  The differential diagnosis includes  PE, ACS, metastatic process. Adverse effect of nitro  MDM: 76 year old male presents today for evaluation of chest pain and shortness of breath.  Work-up here was unremarkable in terms of cardiac biomarkers, EKG, chest x-ray with the exception of mild bibasilar atelectasis.  CTA chest was done to rule out PE given unresponsiveness episode in route.  This was negative for PE.  Unresponsiveness episode was likely a result of nitro administration given timing.  CT head without acute intracranial findings.  CT abdomen pelvis was done as a result of identification of hydronephrosis on CTA chest.  CT abdomen pelvis without contrast demonstrated demonstrated partial bowel obstruction which I discussed with general surgery and they will follow patient while admitted.  Patient's abdomen is benign on exam.  Patient reports he had a bowel movement 2 days ago which is normal for him.  Bilateral ureteral obstructions with right ureter being completely obstructed.  Along with progressive metastatic changes as well as concern for new primary bladder malignancy.  This was discussed with urology and they will follow.  Case discussed with hospitalist will evaluate patient for admission.  Creatinine today was 1.66 which is slightly improved from 1.75 from a few days ago however increased from a baseline of 1 from few weeks ago.  Hemoglobin is 8.2 around baseline.  Potassium of 5.3 but without  acute hyperkalemic EKG changes.  Her also concern for bone metastasis on CT abdomen pelvis.  Given patient's chest pain is reproducible it is likely that this is also metastatic given unremarkable cardiac biomarkers.   Lab Tests: -I ordered, reviewed, and interpreted labs.   The pertinent results include:   Labs Reviewed  RESP PANEL BY RT-PCR (FLU A&B, COVID) ARPGX2  CBC WITH DIFFERENTIAL/PLATELET  COMPREHENSIVE METABOLIC PANEL  TROPONIN I (HIGH SENSITIVITY)      EKG  EKG Interpretation  Date/Time:    Ventricular Rate:    PR Interval:    QRS Duration:   QT Interval:    QTC Calculation:   R Axis:     Text Interpretation:           Imaging Studies ordered: I ordered imaging studies including CTA chest, CT abdomen pelvis, chest x-ray I independently visualized and interpreted imaging. I agree with the radiologist interpretation   Medicines ordered and prescription drug management: No orders of the defined types were placed in this encounter.   -I have reviewed the patients home medicines and have made adjustments as needed  Consultations Obtained: I requested consultation with the urology, general surgery,  and discussed lab and imaging findings as well as pertinent plan - they recommend: Medicine admission and they will follow.   Cardiac Monitoring: The patient was maintained on a cardiac monitor.  I personally viewed and interpreted the cardiac monitored which showed an underlying rhythm of: Normal sinus rhythm  Reevaluation: After the interventions noted above, I reevaluated the patient and found that they have :improved  Co morbidities that complicate the patient evaluation  Past Medical History:  Diagnosis Date   COPD (chronic obstructive pulmonary disease) ([place])    Prostate cancer ([place])       Dispostion: Admit to medicine admit for further work-up and management.  Final Clinical Impression(s) / ED Diagnoses Final diagnoses:  Metastatic  adenocarcinoma to prostate ([place])  AKI (acute kidney injury) ([place])  Partial intestinal obstruction, unspecified cause ([place])    Rx / DC Orders ED Discharge Orders     None         Evlyn Courier,  PA-C 01/09/22 1935    Teressa Lower, MD 01/10/22 [phone]

## 2022-01-09 NOTE — Consult Note (Signed)
Urology Consult   Reason for consult: bilateral hydronephrosis, increased creatinine  History of Present Illness: Jose Wolfe is a 76 y.o. known to urology for a history of metastatic castrate resistant prostate cancer; he is currently on ADT and chemo. Jose Wolfe was scheduled to have a prophylactic TURBT today for gross hematuria, but this was cancelled as the hematuria resolved.   He presented to the ED earlier this evening with chest pain and shortness of breath. A CT angio was performed. I reviewed the images which identified mild/moderate bilateral hydroureteronephrosis tracking down to around the UVJ bilaterally. His creatinine is 1.66, slightly decreased from a creatinine from 2 days ago but up from his baseline of ~0.9 last month.  Today Jose Wolfe reports he is feeling a little better    He had a cystoscopy last month in which his ureteral orifices could not be identified.   Past Medical History:  Diagnosis Date   COPD (chronic obstructive pulmonary disease) ([place])    Prostate cancer [facility])     Past Surgical History:  Procedure Laterality Date   IR IMAGING GUIDED PORT INSERTION  10/09/2021   RADIOACTIVE SEED IMPLANT  2011   TONSILLECTOMY     TRANSURETHRAL RESECTION OF BLADDER TUMOR N/A 12/15/2018   Procedure: CYSTOSCOPY TRANSURETHRAL RESECTION OF BLADDER TUMOR (TURBT);  Surgeon: Irine Seal, MD;  Location: [facility];  Service: Urology;  Laterality: N/A;    Current Hospital Medications:  Home Meds:  No current facility-administered medications on file prior to encounter.   Current Outpatient Medications on File Prior to Encounter  Medication Sig Dispense Refill   B Complex Vitamins (VITAMIN B COMPLEX) TABS Take 1 tablet by mouth daily.     betamethasone dipropionate (DIPROLENE) 0.05 % cream Apply 1 application topically 2 (two) times daily as needed (for psoriasis).   3   budesonide-formoterol (SYMBICORT) 160-4.5 MCG/ACT inhaler Inhale 2 puffs into the lungs 2 (two)  times daily.      calcium-vitamin D (OSCAL WITH D) 500-200 MG-UNIT tablet Take 1 tablet by mouth.     Cyanocobalamin (VITAMIN B 12 PO) Take 1 tablet by mouth daily.     guaiFENesin (MUCINEX) 600 MG 12 hr tablet Take 600 mg by mouth 2 (two) times daily as needed for cough.     lidocaine-prilocaine (EMLA) cream Apply 1 application topically as needed. (Patient taking differently: Apply 1 application topically as needed (access port).) 30 g 0   megestrol (MEGACE ES) 625 MG/5ML suspension Take 5 mLs (625 mg total) by mouth daily. 150 mL 0   prochlorperazine (COMPAZINE) 10 MG tablet Take 1 tablet (10 mg total) by mouth every 6 (six) hours as needed for nausea or vomiting. 60 tablet 3   pseudoephedrine-acetaminophen (TYLENOL SINUS) 30-500 MG TABS tablet Take 1 tablet by mouth every 4 (four) hours as needed (sinus congestion).     senna-docusate (SENNA S) 8.6-50 MG tablet Take 1 tablet by mouth at bedtime as needed for mild constipation. 90 tablet 1   triamcinolone cream (KENALOG) 0.1 % Apply 1 application topically 2 (two) times daily as needed (for psoriasis).  2   VENTOLIN HFA 108 (90 Base) MCG/ACT inhaler INHALE 1 PUFF BY MOUTH EVERY 4 HOURS AS NEEDED 1 each 0   leuprolide, 6 Month, (LEUPROLIDE ACETATE, 6 MONTH,) 45 MG injection See admin instructions.     metoprolol tartrate (LOPRESSOR) 50 MG tablet Take one tablet two hours prior to the test 1 tablet 0   predniSONE (DELTASONE) 5 MG tablet Take 5  mg by mouth daily.       Scheduled Meds:  mometasone-formoterol  2 puff Inhalation BID   Continuous Infusions:  sodium chloride 75 mL/hr at 01/10/22 0440   PRN Meds:.  Allergies:  Allergies  Allergen Reactions   Atorvastatin     Other reaction(s): Myalgias    Family History  Problem Relation Age of Onset   Prostate cancer Brother    Cancer Maternal Uncle        unknown type    Social History:  reports that he has never smoked. He has never used smokeless tobacco. He reports current  alcohol use of about 1.0 standard drink per week. He reports that he does not use drugs.  ROS: A complete review of systems was performed.  All systems are negative except for pertinent findings as noted.  Physical Exam:  Vital signs in last 24 hours: Temp:  [97.9 F (36.6 C)-98.3 F (36.8 C)] 98.3 F (36.8 C) (01/14 0714) Pulse Rate:  [89-104] 99 (01/14 0700) Resp:  [10-22] 22 (01/14 0700) BP: (104-141)/(69-86) 114/77 (01/14 0700) SpO2:  [96 %-100 %] 97 % (01/14 0700) Weight:  [70.8 kg] 70.8 kg (01/14 0714) Constitutional:  Alert and oriented, No acute distress Cardiovascular: Regular rate and rhythm Respiratory: Normal respiratory effort, Lungs clear bilaterally GI: Abdomen is soft, nontender, nondistended, no abdominal masses GU: No CVA tenderness Neurologic: Grossly intact, no focal deficits Psychiatric: Normal mood and affect  Laboratory Data:  Recent Labs    01/07/22 0841 01/09/22 1540 01/10/22 0304  WBC 4.9 7.0 5.2  HGB 7.7* 8.2* 8.1*  HCT 22.7* 25.7* 25.5*  PLT 287 247 230    Recent Labs    01/07/22 0841 01/09/22 1540 01/10/22 0304  NA 136 134* 134*  K 4.6 5.3* 5.2*  CL 104 104 103  GLUCOSE 96 94 98  BUN 29* 31* 27*  CALCIUM 8.8* 9.1 8.3*  CREATININE 1.87* 1.66* 1.59*     Results for orders placed or performed during the hospital encounter of 01/09/22 (from the past 24 hour(s))  Resp Panel by RT-PCR (Flu A&B, Covid) Nasopharyngeal Swab     Status: None   Collection Time: 01/09/22  2:46 PM   Specimen: Nasopharyngeal Swab; Nasopharyngeal(NP) swabs in vial transport medium  Result Value Ref Range   SARS Coronavirus 2 by RT PCR NEGATIVE NEGATIVE   Influenza A by PCR NEGATIVE NEGATIVE   Influenza B by PCR NEGATIVE NEGATIVE  CBC with Differential     Status: Abnormal   Collection Time: 01/09/22  3:40 PM  Result Value Ref Range   WBC 7.0 4.0 - 10.5 K/uL   RBC 2.75 (L) 4.22 - 5.81 MIL/uL   Hemoglobin 8.2 (L) 13.0 - 17.0 g/dL   HCT 25.7 (L) 39.0 - 52.0  %   MCV 93.5 80.0 - 100.0 fL   MCH 29.8 26.0 - 34.0 pg   MCHC 31.9 30.0 - 36.0 g/dL   RDW 20.7 (H) 11.5 - 15.5 %   Platelets 247 150 - 400 K/uL   nRBC 0.0 0.0 - 0.2 %   Neutrophils Relative % 93 %   Neutro Abs 6.6 1.7 - 7.7 K/uL   Lymphocytes Relative 3 %   Lymphs Abs 0.2 (L) 0.7 - 4.0 K/uL   Monocytes Relative 3 %   Monocytes Absolute 0.2 0.1 - 1.0 K/uL   Eosinophils Relative 0 %   Eosinophils Absolute 0.0 0.0 - 0.5 K/uL   Basophils Relative 0 %   Basophils Absolute 0.0 0.0 -  0.1 K/uL   Immature Granulocytes 1 %   Abs Immature Granulocytes 0.06 0.00 - 0.07 K/uL  Comprehensive metabolic panel     Status: Abnormal   Collection Time: 01/09/22  3:40 PM  Result Value Ref Range   Sodium 134 (L) 135 - 145 mmol/L   Potassium 5.3 (H) 3.5 - 5.1 mmol/L   Chloride 104 98 - 111 mmol/L   CO2 20 (L) 22 - 32 mmol/L   Glucose, Bld 94 70 - 99 mg/dL   BUN 31 (H) 8 - 23 mg/dL   Creatinine, Ser 1.66 (H) 0.61 - 1.24 mg/dL   Calcium 9.1 8.9 - 10.3 mg/dL   Total Protein 6.0 (L) 6.5 - 8.1 g/dL   Albumin 3.1 (L) 3.5 - 5.0 g/dL   AST 98 (H) 15 - 41 U/L   ALT 15 0 - 44 U/L   Alkaline Phosphatase 54 38 - 126 U/L   Total Bilirubin 0.8 0.3 - 1.2 mg/dL   GFR, Estimated 42 (L) >60 mL/min   Anion gap 10 5 - 15  Troponin I (High Sensitivity)     Status: None   Collection Time: 01/09/22  3:40 PM  Result Value Ref Range   Troponin I (High Sensitivity) 9 <18 ng/L  Troponin I (High Sensitivity)     Status: None   Collection Time: 01/09/22  4:47 PM  Result Value Ref Range   Troponin I (High Sensitivity) 8 <18 ng/L  Protime-INR     Status: None   Collection Time: 01/10/22  3:04 AM  Result Value Ref Range   Prothrombin Time 14.5 11.4 - 15.2 seconds   INR 1.1 0.8 - 1.2  Phosphorus     Status: Abnormal   Collection Time: 01/10/22  3:04 AM  Result Value Ref Range   Phosphorus 4.7 (H) 2.5 - 4.6 mg/dL  Magnesium     Status: None   Collection Time: 01/10/22  3:04 AM  Result Value Ref Range   Magnesium 1.8  1.7 - 2.4 mg/dL  Magnesium     Status: None   Collection Time: 01/10/22  3:04 AM  Result Value Ref Range   Magnesium 1.8 1.7 - 2.4 mg/dL  Comprehensive metabolic panel     Status: Abnormal   Collection Time: 01/10/22  3:04 AM  Result Value Ref Range   Sodium 134 (L) 135 - 145 mmol/L   Potassium 5.2 (H) 3.5 - 5.1 mmol/L   Chloride 103 98 - 111 mmol/L   CO2 21 (L) 22 - 32 mmol/L   Glucose, Bld 98 70 - 99 mg/dL   BUN 27 (H) 8 - 23 mg/dL   Creatinine, Ser 1.59 (H) 0.61 - 1.24 mg/dL   Calcium 8.3 (L) 8.9 - 10.3 mg/dL   Total Protein 5.8 (L) 6.5 - 8.1 g/dL   Albumin 2.9 (L) 3.5 - 5.0 g/dL   AST 128 (H) 15 - 41 U/L   ALT 16 0 - 44 U/L   Alkaline Phosphatase 77 38 - 126 U/L   Total Bilirubin 0.6 0.3 - 1.2 mg/dL   GFR, Estimated 44 (L) >60 mL/min   Anion gap 10 5 - 15  CBC with Differential/Platelet     Status: Abnormal   Collection Time: 01/10/22  3:04 AM  Result Value Ref Range   WBC 5.2 4.0 - 10.5 K/uL   RBC 2.73 (L) 4.22 - 5.81 MIL/uL   Hemoglobin 8.1 (L) 13.0 - 17.0 g/dL   HCT 25.5 (L) 39.0 - 52.0 %   MCV  93.4 80.0 - 100.0 fL   MCH 29.7 26.0 - 34.0 pg   MCHC 31.8 30.0 - 36.0 g/dL   RDW 20.8 (H) 11.5 - 15.5 %   Platelets 230 150 - 400 K/uL   nRBC 0.0 0.0 - 0.2 %   Neutrophils Relative % 91 %   Neutro Abs 4.7 1.7 - 7.7 K/uL   Lymphocytes Relative 5 %   Lymphs Abs 0.2 (L) 0.7 - 4.0 K/uL   Monocytes Relative 3 %   Monocytes Absolute 0.2 0.1 - 1.0 K/uL   Eosinophils Relative 0 %   Eosinophils Absolute 0.0 0.0 - 0.5 K/uL   Basophils Relative 0 %   Basophils Absolute 0.0 0.0 - 0.1 K/uL   Immature Granulocytes 1 %   Abs Immature Granulocytes 0.03 0.00 - 0.07 K/uL  Iron and TIBC     Status: None   Collection Time: 01/10/22  3:04 AM  Result Value Ref Range   Iron 67 45 - 182 ug/dL   TIBC 255 250 - 450 ug/dL   Saturation Ratios 26 17.9 - 39.5 %   UIBC 188 ug/dL  Reticulocytes     Status: Abnormal   Collection Time: 01/10/22  3:04 AM  Result Value Ref Range   Retic Ct Pct  0.7 0.4 - 3.1 %   RBC. 2.72 (L) 4.22 - 5.81 MIL/uL   Retic Count, Absolute 18.5 (L) 19.0 - 186.0 K/uL   Immature Retic Fract 5.9 2.3 - 15.9 %  Folate     Status: None   Collection Time: 01/10/22  3:04 AM  Result Value Ref Range   Folate 17.3 >5.9 ng/mL  Ferritin     Status: Abnormal   Collection Time: 01/10/22  3:04 AM  Result Value Ref Range   Ferritin 7,091 (H) 24 - 336 ng/mL   Recent Results (from the past 240 hour(s))  Resp Panel by RT-PCR (Flu A&B, Covid) Nasopharyngeal Swab     Status: None   Collection Time: 01/09/22  2:46 PM   Specimen: Nasopharyngeal Swab; Nasopharyngeal(NP) swabs in vial transport medium  Result Value Ref Range Status   SARS Coronavirus 2 by RT PCR NEGATIVE NEGATIVE Final    Comment: (NOTE) SARS-CoV-2 target nucleic acids are NOT DETECTED.  The SARS-CoV-2 RNA is generally detectable in upper respiratory specimens during the acute phase of infection. The lowest concentration of SARS-CoV-2 viral copies this assay can detect is 138 copies/mL. A negative result does not preclude SARS-Cov-2 infection and should not be used as the sole basis for treatment or other patient management decisions. A negative result may occur with  improper specimen collection/handling, submission of specimen other than nasopharyngeal swab, presence of viral mutation(s) within the areas targeted by this assay, and inadequate number of viral copies(<138 copies/mL). A negative result must be combined with clinical observations, patient history, and epidemiological information. The expected result is Negative.  Fact Sheet for Patients:  EntrepreneurPulse.com.au  Fact Sheet for Healthcare Providers:  IncredibleEmployment.be  This test is no t yet approved or cleared by the [place] FDA and  has been authorized for detection and/or diagnosis of SARS-CoV-2 by FDA under an Emergency Use Authorization (EUA). This EUA will remain  in effect  (meaning this test can be used) for the duration of the COVID-19 declaration under Section 564(b)(1) of the Act, 21 U.S.C.section 360bbb-3(b)(1), unless the authorization is terminated  or revoked sooner.       Influenza A by PCR NEGATIVE NEGATIVE Final   Influenza B by PCR  NEGATIVE NEGATIVE Final    Comment: (NOTE) The Xpert Xpress SARS-CoV-2/FLU/RSV plus assay is intended as an aid in the diagnosis of influenza from Nasopharyngeal swab specimens and should not be used as a sole basis for treatment. Nasal washings and aspirates are unacceptable for Xpert Xpress SARS-CoV-2/FLU/RSV testing.  Fact Sheet for Patients: EntrepreneurPulse.com.au  Fact Sheet for Healthcare Providers: IncredibleEmployment.be  This test is not yet approved or cleared by the [place] FDA and has been authorized for detection and/or diagnosis of SARS-CoV-2 by FDA under an Emergency Use Authorization (EUA). This EUA will remain in effect (meaning this test can be used) for the duration of the COVID-19 declaration under Section 564(b)(1) of the Act, 21 U.S.C. section 360bbb-3(b)(1), unless the authorization is terminated or revoked.  Performed at [facility], [place] [address] [place], [place] [address]     Renal Function: Recent Labs    01/07/22 1324 01/09/22 1540 01/10/22 0304  CREATININE 1.87* 1.66* 1.59*   Estimated Creatinine Clearance: 39 mL/min (A) (by C-G formula based on SCr of 1.59 mg/dL (H)).  Radiologic Imaging: CT ABDOMEN PELVIS WO CONTRAST  Result Date: 01/09/2022 CLINICAL DATA:  Abdominal pain, acute, nonlocalized evaluate for obstruction. bilateral hydronephrosis on CTA. EXAM: CT ABDOMEN AND PELVIS WITHOUT CONTRAST TECHNIQUE: Multidetector CT imaging of the abdomen and pelvis was performed following the standard protocol without IV contrast. RADIATION DOSE REDUCTION: This exam was performed according to the departmental dose-optimization  program which includes automated exposure control, adjustment of the mA and/or kV according to patient size and/or use of iterative reconstruction technique. COMPARISON:  10/04/2019 FINDINGS: Lower chest: No acute abnormality. Moderate coronary artery calcification. Small hiatal hernia. Hepatobiliary: No focal liver abnormality is seen. No gallstones, gallbladder wall thickening, or biliary dilatation. Pancreas: Unremarkable Spleen: Unremarkable Adrenals/Urinary Tract: Dense calcification within the right adrenal gland is in keeping with prior infarction and/or hemorrhage. Left adrenal gland is unremarkable. The kidneys are normal in size and position. There is moderate bilateral hydronephrosis, new from prior PET CT examination of 09/19/2021. Left ureteral distension extends to the level of the a lobulated mass within the left hemipelvis, best seen on image # 60/4. Right ureteral obstruction extends to the level of the distal right ureter just proximal to the right ureterovesicular junction. A discrete obstructing mass is not clearly identified in this location. There is, however, asymmetric bladder wall thickening in the region of the right ureterovesicular junction and circumferential thickening of the distal right ureter which may reflect malignant infiltration in this location, best seen on coronal image # 57-58/7. Additionally, there is a lobulated mural mass within the dome of the bladder measuring roughly 3.9 x 2.8 x 6.3 cm in greatest dimension, best seen on axial image # 70/4. Stomach/Bowel: The transverse colon is redundant. There is an internal hernia present with multiple loops of small bowel extending above the mid transverse colon into the right anterior abdomen. This results in a partial obstruction of the transverse colon with large volume stool proximal to the point of obstruction secondary to mass effect. This is best seen on axial image # 39/4 and coronal image # 23-27. The stomach and small  bowel are unremarkable. The appendix is not clearly identified and may be absent. No free intraperitoneal gas or fluid. Vascular/Lymphatic: As noted above, there is a soft tissue mass within the left perirectal soft tissues measuring 2.7 x 6.1 cm on axial image # 65/4 possibly representing a nodal conglomerate given the findings within the bladder. There is,  additionally, pathologic right retrocrural, left periaortic, right pericaval, left pelvic sidewall and right external iliac lymphadenopathy with the index lymph node measuring 3.2 x 3.5 cm at axial image # 69/4. Moderate aortoiliac atherosclerotic calcification. No aortic aneurysm. Reproductive: Brachytherapy seeds are seen within the prostate gland. Other: No abdominal wall hernia. Musculoskeletal: Widespread sclerotic metastases are seen within the visualized axial skeleton, significantly progressed since prior examination. No pathologic fracture identified. IMPRESSION: Interval development of a moderate bilateral hydronephrosis secondary to probable malignant infiltration of the distal right ureter and extrinsic compression of the distal left ureter secondary to perirectal soft tissue possibly representing a metastatic nodal conglomerate. Interval development of asymmetric wall thickening with a superimposed intraluminal mural mass within the bladder suspicious for a primary bladder malignancy. This would be better assessed with cystoscopy. Less likely, this may represent a metastatic mass related to the patient's underlying prostate malignancy. Widespread retroperitoneal metastatic adenopathy, progressive since prior PET CT examination. This can be seen both in the setting of metastatic bladder and metastatic prostate cancer, however, its presence on prior PET CT examination suggests that this may reflect progressive disease related to the patient's metastatic prostate cancer. Right external iliac pathologic lymphadenopathy would be amenable to percutaneous  ultrasound-guided biopsy if clinically indicated. Widespread osteoblastic metastases, significantly progressed since prior examination, most in keeping with progressive metastatic prostate cancer. Aortic Atherosclerosis (ICD10-I70.0). Electronically Signed   By: Fidela Salisbury M.D.   On: 01/09/2022 17:02   CT Head Wo Contrast  Result Date: 01/09/2022 CLINICAL DATA:  Headache, prostate cancer currently undergoing chemotherapy, began new medication with generalized feeling of unwellness, episode of unresponsiveness and right gaze, apnea EXAM: CT HEAD WITHOUT CONTRAST TECHNIQUE: Contiguous axial images were obtained from the base of the skull through the vertex without intravenous contrast. RADIATION DOSE REDUCTION: This exam was performed according to the departmental dose-optimization program which includes automated exposure control, adjustment of the mA and/or kV according to patient size and/or use of iterative reconstruction technique. COMPARISON:  None. FINDINGS: Brain: No acute infarct or hemorrhage. Lateral ventricles and midline structures are unremarkable. No acute extra-axial fluid collections. No mass effect. Vascular: No hyperdense vessel or unexpected calcification. Skull: Normal. Negative for fracture or focal lesion. Sinuses/Orbits: Partial opacification of the ethmoid air cells. Remaining paranasal sinuses are clear. Other: None. IMPRESSION: 1. No acute intracranial process. Electronically Signed   By: Randa Ngo M.D.   On: 01/09/2022 15:41   CT Angio Chest PE W and/or Wo Contrast  Result Date: 01/09/2022 CLINICAL DATA:  Shortness of breath, chest pain. History of metastatic prostate cancer. EXAM: CT ANGIOGRAPHY CHEST WITH CONTRAST TECHNIQUE: Multidetector CT imaging of the chest was performed using the standard protocol during bolus administration of intravenous contrast. Multiplanar CT image reconstructions and MIPs were obtained to evaluate the vascular anatomy. RADIATION DOSE  REDUCTION: This exam was performed according to the departmental dose-optimization program which includes automated exposure control, adjustment of the mA and/or kV according to patient size and/or use of iterative reconstruction technique. CONTRAST:  43mL OMNIPAQUE IOHEXOL 350 MG/ML SOLN COMPARISON:  July 29, 2006. FINDINGS: Cardiovascular: Satisfactory opacification of the pulmonary arteries to the segmental level. No evidence of pulmonary embolism. Normal heart size. No pericardial effusion. Atherosclerosis of thoracic aorta is noted. Mediastinum/Nodes: Small sliding-type hiatal hernia. No adenopathy is noted. Thyroid gland is unremarkable. Lungs/Pleura: Lungs are clear. No pleural effusion or pneumothorax. Upper Abdomen: There appears to be mild bilateral hydronephrosis. Musculoskeletal: Sclerotic densities are noted throughout the spine and ribs consistent with osseous  metastatic disease. Review of the MIP images confirms the above findings. IMPRESSION: No definite evidence of pulmonary embolus. Continued presence of diffuse osseous metastases. Probable bilateral hydronephrosis is seen in the visualized portion of the upper abdomen. Small sliding-type hiatal hernia. Aortic Atherosclerosis (ICD10-I70.0). Electronically Signed   By: Marijo Conception M.D.   On: 01/09/2022 15:42   DG Chest Portable 1 View  Result Date: 01/09/2022 CLINICAL DATA:  Chest pain. EXAM: PORTABLE CHEST 1 VIEW COMPARISON:  December 23, 2018. FINDINGS: The heart size and mediastinal contours are within normal limits. Right internal jugular Port-A-Cath is noted with distal tip in expected position of the SVC. Mild bibasilar subsegmental atelectasis is noted. The visualized skeletal structures are unremarkable. IMPRESSION: Mild bibasilar subsegmental atelectasis. Electronically Signed   By: Marijo Conception M.D.   On: 01/09/2022 15:01    I independently reviewed the above imaging studies.  Impression/Recommendation Mild moderate  bilateral hydronephrosis in the setting of AKI 2.  History of advanced metastatic prostate cancer  Jose Heldt creatinine this morning is essentially unchanged from last night. At this time, I would recommend the patient be made NPO at midnight and his creatinine reassessed in the morning. If his creatinine remains persistently elevated despite hydration, he would likely benefit from decompression of his kidneys. As mentioned above, Jose Wolfe's ureteral orifices were not able to be identified on recent cysto, so if necessary, I would recommend nephrostomy tube drainage, ideally with antegrade stent placement, with IR.   Urology will continue to follow  Donald Pore MD 01/10/2022, 7:45 AM  [facility]  Pager: [phone]

## 2022-01-09 NOTE — ED Notes (Signed)
ED Provider at bedside. 

## 2022-01-09 NOTE — H&P (Addendum)
History and Physical    PLEASE NOTE THAT DRAGON DICTATION SOFTWARE WAS USED IN THE CONSTRUCTION OF THIS NOTE.   Jose Wolfe TMH:[id number] DOB: 19-May-1946 DOA: 01/09/2022  PCP: [name], MD  Patient coming from: home   I have personally briefly reviewed patient's old medical records in [place]  Chief Complaint: Chest pain  HPI: Jose Wolfe is a 76 y.o. male with medical history significant for metastatic prostate cancer undergoing chemotherapy, COPD, who is admitted to [facility] on 01/09/2022 with obstructive uropathy and acute kidney injury after presenting from home to [facility] ED complaining of chest pain.   The patient reports 3 to 4 days of right-sided chest discomfort that is reproducible to palpation over the anterior right chest wall.  He conveys that this pain is not exertional, pleuritic, or positional.  Denies any associated palpitations, diaphoresis, dizziness, presyncope, or syncope.  No recent trauma, hemoptysis, wheezing.  Denies any history of PE, and also denies any recent new onset calf tenderness or or new lower extremity erythema.  No associated any subjective fever, chills, rigors, or generalized myalgias.  He has been nearly constant over the above timeframe, with gradual increase in associated intensity over that timeframe.  He also moved to 3 days of sharp, mild diffuse abdominal discomfort, most prominent in the bilateral lower abdominal quadrants.  He notes decline in stool output, stating the most recent bowel movement occurred yesterday, it was small relative to baseline at which he moves his pelvis on a nearly daily basis.  Not associate any melena or hematochezia.  He also notes decline in flatus production over that timeframe.  Reports associated nausea in the absence of any vomiting.  No recent travel.  Denies any preceding diarrhea.  In setting of the symptoms, he notes decline in appetite resulting in decline in  consumption of food and water over the last few days.  He has a known history of metastatic prostate cancer for which he is undergoing chemotherapy.  Had right-sided Port-A-Cath placed in October 2022.  Follows with Dr. Alen Blew of oncology. Underwent most recent chemo 3 days ago, and appears to be scheduled for his next chemotherapy infusion on 01/28/2022.  Per chart review, baseline creatinine appears to be 0.8-0.9, with value of 0.89 on 12/17/2021, and with most recent serum creatinine data point noted to be 1.87 on 01/07/2022.  Additionally, per chart review, patient has had a hemoglobin range of 8-10 since October 2022, with hemoglobin value of 8.4 on 12/17/2021 as well as 7.7 on 01/07/2022.     ED Course:  Vital signs in the ED were notable for the following: Afebrile; heart rate 88-94; blood pressure 128/75 -141/82; respiratory rate 16-18, oxygen saturation 99 to 100% on room air.  Labs were notable for the following: CMP notable for the following: Potassium 5.3, bicarbonate 20, anion gap 10, creatinine 1.66, calcium corrected for mild hypoalbuminemia 9.9, albumin 3.1, alkaline phosphatase 54, total bilirubin 0.8.  Initial high-sensitivity troponin I noted to be 9, with repeat value trending down to 8.  CBC notable for white blood cell count 7000, hemoglobin 8.2 associated normocytic/normochromic findings, platelet count 247.  COVID-19/influenza PCR found to be negative.  Imaging and additional notable ED work-up: EKG shows sinus rhythm with heart rate 63, right bundle branch block, nonspecific T wave inversion in 3, and no evidence of ST changes, including no evidence of ST elevation.  Chest x-ray showed mild bibasilar subsegmental atelectasis, otherwise showed no evidence of acute cardiopulmonary  process, including no evidence of infiltrate, edema, effusion, or pneumothorax.  CTA chest showed no evidence of acute pulmonary embolism, while showing diffuse osseous metastatic disease involving the ribs  and spine, while showing no evidence of infiltrate, edema, effusion, or pneumothorax.  CT abdomen/pelvis showed partial obstruction of the transverse colon with large volume of stool proximal to this point of obstruction; small bowel unremarkable; this imaging also showed interval development of moderate bilateral hydronephrosis secondary to probable malignant infiltration of the distal right ureter and extrinsic compression of the distal left ureter secondary to perirectal soft tissue, potentially representing metastatic nodal conglomerate; additionally, this imaging showed interval development of intraluminal mural mass within the bladder, felt to be suspicious for primary malignancy as opposed to representing a metastatic consequence of underlying prostate cancer.  EDP discussed the patient's case with the on-call general surgeon, Dr. Zenia Resides, who will formally consult with additional recommendations pending at this time.  Additionally, EDP discussed the patient's case with the on-call urologist, Dr. Cain Sieve, Who will formally consult, and will make additional recommendations that include recommendations relating to management of bilateral ureteral obstruction, including cystoscopy with ureteral stent placement versus nephrostomy tube placement.  While in the ED, the following were administered: Morphine 4 mg IV x1, Zofran 4 mg IV x1, and a 1 L normal saline bolus was administered presumptively, the patient was admitted for further evaluation and management of bilateral obstructive uropathy, acute kidney injury, as well as partial large bowel obstruction.     Review of Systems: As per HPI otherwise 10 point review of systems negative.   Past Medical History:  Diagnosis Date   COPD (chronic obstructive pulmonary disease) ([place])    Prostate cancer [facility])     Past Surgical History:  Procedure Laterality Date   IR IMAGING GUIDED PORT INSERTION  10/09/2021   RADIOACTIVE SEED IMPLANT  2011    TONSILLECTOMY     TRANSURETHRAL RESECTION OF BLADDER TUMOR N/A 12/15/2018   Procedure: CYSTOSCOPY TRANSURETHRAL RESECTION OF BLADDER TUMOR (TURBT);  Surgeon: Irine Seal, MD;  Location: [facility] ORS;  Service: Urology;  Laterality: N/A;    Social History:  reports that he has never smoked. He has never used smokeless tobacco. He reports current alcohol use of about 1.0 standard drink per week. He reports that he does not use drugs.   Allergies  Allergen Reactions   Atorvastatin     Other reaction(s): Myalgias    Family History  Problem Relation Age of Onset   Prostate cancer Brother    Cancer Maternal Uncle        unknown type    Family history reviewed and not pertinent    Prior to Admission medications   Medication Sig Start Date End Date Taking? Authorizing Provider  B Complex Vitamins (VITAMIN B COMPLEX) TABS Take 1 tablet by mouth daily.   Yes [provider]  megestrol (MEGACE ES) 625 MG/5ML suspension Take 5 mLs (625 mg total) by mouth daily. 01/02/22  Yes Wyatt Portela, MD  betamethasone dipropionate (DIPROLENE) 0.05 % cream Apply 1 application topically 2 (two) times daily as needed (for psoriasis).  10/28/18   [provider]  budesonide-formoterol (SYMBICORT) 160-4.5 MCG/ACT inhaler Inhale 2 puffs into the lungs 2 (two) times daily.     [provider]  calcium-vitamin D (OSCAL WITH D) 500-200 MG-UNIT tablet Take 1 tablet by mouth.    [provider]  Cyanocobalamin (VITAMIN B 12 PO) Take by mouth daily.    [provider]  leuprolide, 6 Month, (LEUPROLIDE ACETATE, 6 MONTH,) 45 MG injection See admin instructions.    [provider]  lidocaine-prilocaine (EMLA) cream Apply 1 application topically as needed. 09/23/21   Wyatt Portela, MD  metoprolol tartrate (LOPRESSOR) 50 MG tablet Take one tablet two hours prior to the test 10/31/21   Croitoru, Mihai, MD  predniSONE (DELTASONE) 5 MG tablet Take 5 mg by mouth daily. 06/02/21    [provider]  prochlorperazine (COMPAZINE) 10 MG tablet Take 1 tablet (10 mg total) by mouth every 6 (six) hours as needed for nausea or vomiting. 11/05/21   Wyatt Portela, MD  pseudoephedrine-acetaminophen (TYLENOL SINUS) 30-500 MG TABS tablet Take 1 tablet by mouth every 4 (four) hours as needed.    [provider]  senna-docusate (SENNA S) 8.6-50 MG tablet Take 1 tablet by mouth at bedtime as needed for mild constipation. 01/07/22   Wyatt Portela, MD  triamcinolone cream (KENALOG) 0.1 % Apply 1 application topically 2 (two) times daily as needed (for psoriasis). 10/28/18   [provider]  VENTOLIN HFA 108 (90 Base) MCG/ACT inhaler INHALE 1 PUFF BY MOUTH EVERY 4 HOURS AS NEEDED 09/10/21   Croitoru, Mihai, MD     Objective    Physical Exam: Vitals:   01/09/22 1530 01/09/22 1600 01/09/22 1700 01/09/22 1900  BP: 133/74 128/75 131/81 123/81  Pulse: 92 89 91 (!) 104  Resp: 16 10 13 13   Temp:      TempSrc:      SpO2: 100% 99% 99% 97%    General: appears to be stated age; alert, oriented Skin: warm, dry, no rash Head:  AT/[place] Mouth:  Oral mucosa membranes appear dry, normal dentition Neck: supple; trachea midline Heart:  RRR; did not appreciate any M/R/G Chest: Reproducible tenderness with palpation over the right anterior chest wall. Lungs: CTAB, did not appreciate any wheezes, rales, or rhonchi Abdomen: Hypoactive bowel sounds noted; soft, ND, mild tenderness to palpation throughout all abdominal quadrants, most prominent in the bilateral lower abdominal quadrants, in the absence of any associated guarding, rigidity, or rebound tenderness. Vascular: 2+ pedal pulses b/l; 2+ radial pulses b/l Extremities: no peripheral edema, no muscle wasting Neuro: strength and sensation intact in upper and lower extremities b/l    Labs on Admission: I have personally reviewed following labs and imaging studies  CBC: Recent Labs  Lab 01/07/22 0841 01/09/22 1540   WBC 4.9 7.0  NEUTROABS 3.7 6.6  HGB 7.7* 8.2*  HCT 22.7* 25.7*  MCV 90.4 93.5  PLT 287 240   Basic Metabolic Panel: Recent Labs  Lab 01/07/22 0841 01/09/22 1540  NA 136 134*  K 4.6 5.3*  CL 104 104  CO2 24 20*  GLUCOSE 96 94  BUN 29* 31*  CREATININE 1.87* 1.66*  CALCIUM 8.8* 9.1   GFR: Estimated Creatinine Clearance: 37.4 mL/min (A) (by C-G formula based on SCr of 1.66 mg/dL (H)). Liver Function Tests: Recent Labs  Lab 01/07/22 0841 01/09/22 1540  AST 23 98*  ALT 14 15  ALKPHOS 51 54  BILITOT 0.3 0.8  PROT 6.7 6.0*  ALBUMIN 3.7 3.1*   No results for input(s): LIPASE, AMYLASE in the last 168 hours. No results for input(s): AMMONIA in the last 168 hours. Coagulation Profile: No results for input(s): INR, PROTIME in the last 168 hours. Cardiac Enzymes: No results for input(s): CKTOTAL, CKMB, CKMBINDEX, TROPONINI in the last 168 hours. BNP (last 3 results) No results for input(s): PROBNP in the last 8760  hours. HbA1C: No results for input(s): HGBA1C in the last 72 hours. CBG: No results for input(s): GLUCAP in the last 168 hours. Lipid Profile: No results for input(s): CHOL, HDL, LDLCALC, TRIG, CHOLHDL, LDLDIRECT in the last 72 hours. Thyroid Function Tests: No results for input(s): TSH, T4TOTAL, FREET4, T3FREE, THYROIDAB in the last 72 hours. Anemia Panel: No results for input(s): VITAMINB12, FOLATE, FERRITIN, TIBC, IRON, RETICCTPCT in the last 72 hours. Urine analysis: No results found for: COLORURINE, APPEARANCEUR, LABSPEC, [place], GLUCOSEU, HGBUR, BILIRUBINUR, KETONESUR, PROTEINUR, UROBILINOGEN, NITRITE, LEUKOCYTESUR  Radiological Exams on Admission: CT ABDOMEN PELVIS WO CONTRAST  Result Date: 01/09/2022 CLINICAL DATA:  Abdominal pain, acute, nonlocalized evaluate for obstruction. bilateral hydronephrosis on CTA. EXAM: CT ABDOMEN AND PELVIS WITHOUT CONTRAST TECHNIQUE: Multidetector CT imaging of the abdomen and pelvis was performed following the standard  protocol without IV contrast. RADIATION DOSE REDUCTION: This exam was performed according to the departmental dose-optimization program which includes automated exposure control, adjustment of the mA and/or kV according to patient size and/or use of iterative reconstruction technique. COMPARISON:  10/04/2019 FINDINGS: Lower chest: No acute abnormality. Moderate coronary artery calcification. Small hiatal hernia. Hepatobiliary: No focal liver abnormality is seen. No gallstones, gallbladder wall thickening, or biliary dilatation. Pancreas: Unremarkable Spleen: Unremarkable Adrenals/Urinary Tract: Dense calcification within the right adrenal gland is in keeping with prior infarction and/or hemorrhage. Left adrenal gland is unremarkable. The kidneys are normal in size and position. There is moderate bilateral hydronephrosis, new from prior PET CT examination of 09/19/2021. Left ureteral distension extends to the level of the a lobulated mass within the left hemipelvis, best seen on image # 60/4. Right ureteral obstruction extends to the level of the distal right ureter just proximal to the right ureterovesicular junction. A discrete obstructing mass is not clearly identified in this location. There is, however, asymmetric bladder wall thickening in the region of the right ureterovesicular junction and circumferential thickening of the distal right ureter which may reflect malignant infiltration in this location, best seen on coronal image # 57-58/7. Additionally, there is a lobulated mural mass within the dome of the bladder measuring roughly 3.9 x 2.8 x 6.3 cm in greatest dimension, best seen on axial image # 70/4. Stomach/Bowel: The transverse colon is redundant. There is an internal hernia present with multiple loops of small bowel extending above the mid transverse colon into the right anterior abdomen. This results in a partial obstruction of the transverse colon with large volume stool proximal to the point of  obstruction secondary to mass effect. This is best seen on axial image # 39/4 and coronal image # 23-27. The stomach and small bowel are unremarkable. The appendix is not clearly identified and may be absent. No free intraperitoneal gas or fluid. Vascular/Lymphatic: As noted above, there is a soft tissue mass within the left perirectal soft tissues measuring 2.7 x 6.1 cm on axial image # 65/4 possibly representing a nodal conglomerate given the findings within the bladder. There is, additionally, pathologic right retrocrural, left periaortic, right pericaval, left pelvic sidewall and right external iliac lymphadenopathy with the index lymph node measuring 3.2 x 3.5 cm at axial image # 69/4. Moderate aortoiliac atherosclerotic calcification. No aortic aneurysm. Reproductive: Brachytherapy seeds are seen within the prostate gland. Other: No abdominal wall hernia. Musculoskeletal: Widespread sclerotic metastases are seen within the visualized axial skeleton, significantly progressed since prior examination. No pathologic fracture identified. IMPRESSION: Interval development of a moderate bilateral hydronephrosis secondary to probable malignant infiltration of the distal right ureter and  extrinsic compression of the distal left ureter secondary to perirectal soft tissue possibly representing a metastatic nodal conglomerate. Interval development of asymmetric wall thickening with a superimposed intraluminal mural mass within the bladder suspicious for a primary bladder malignancy. This would be better assessed with cystoscopy. Less likely, this may represent a metastatic mass related to the patient's underlying prostate malignancy. Widespread retroperitoneal metastatic adenopathy, progressive since prior PET CT examination. This can be seen both in the setting of metastatic bladder and metastatic prostate cancer, however, its presence on prior PET CT examination suggests that this may reflect progressive disease related  to the patient's metastatic prostate cancer. Right external iliac pathologic lymphadenopathy would be amenable to percutaneous ultrasound-guided biopsy if clinically indicated. Widespread osteoblastic metastases, significantly progressed since prior examination, most in keeping with progressive metastatic prostate cancer. Aortic Atherosclerosis (ICD10-I70.0). Electronically Signed   By: Fidela Salisbury M.D.   On: 01/09/2022 17:02   CT Head Wo Contrast  Result Date: 01/09/2022 CLINICAL DATA:  Headache, prostate cancer currently undergoing chemotherapy, began new medication with generalized feeling of unwellness, episode of unresponsiveness and right gaze, apnea EXAM: CT HEAD WITHOUT CONTRAST TECHNIQUE: Contiguous axial images were obtained from the base of the skull through the vertex without intravenous contrast. RADIATION DOSE REDUCTION: This exam was performed according to the departmental dose-optimization program which includes automated exposure control, adjustment of the mA and/or kV according to patient size and/or use of iterative reconstruction technique. COMPARISON:  None. FINDINGS: Brain: No acute infarct or hemorrhage. Lateral ventricles and midline structures are unremarkable. No acute extra-axial fluid collections. No mass effect. Vascular: No hyperdense vessel or unexpected calcification. Skull: Normal. Negative for fracture or focal lesion. Sinuses/Orbits: Partial opacification of the ethmoid air cells. Remaining paranasal sinuses are clear. Other: None. IMPRESSION: 1. No acute intracranial process. Electronically Signed   By: Randa Ngo M.D.   On: 01/09/2022 15:41   CT Angio Chest PE W and/or Wo Contrast  Result Date: 01/09/2022 CLINICAL DATA:  Shortness of breath, chest pain. History of metastatic prostate cancer. EXAM: CT ANGIOGRAPHY CHEST WITH CONTRAST TECHNIQUE: Multidetector CT imaging of the chest was performed using the standard protocol during bolus administration of intravenous  contrast. Multiplanar CT image reconstructions and MIPs were obtained to evaluate the vascular anatomy. RADIATION DOSE REDUCTION: This exam was performed according to the departmental dose-optimization program which includes automated exposure control, adjustment of the mA and/or kV according to patient size and/or use of iterative reconstruction technique. CONTRAST:  69mL OMNIPAQUE IOHEXOL 350 MG/ML SOLN COMPARISON:  July 29, 2006. FINDINGS: Cardiovascular: Satisfactory opacification of the pulmonary arteries to the segmental level. No evidence of pulmonary embolism. Normal heart size. No pericardial effusion. Atherosclerosis of thoracic aorta is noted. Mediastinum/Nodes: Small sliding-type hiatal hernia. No adenopathy is noted. Thyroid gland is unremarkable. Lungs/Pleura: Lungs are clear. No pleural effusion or pneumothorax. Upper Abdomen: There appears to be mild bilateral hydronephrosis. Musculoskeletal: Sclerotic densities are noted throughout the spine and ribs consistent with osseous metastatic disease. Review of the MIP images confirms the above findings. IMPRESSION: No definite evidence of pulmonary embolus. Continued presence of diffuse osseous metastases. Probable bilateral hydronephrosis is seen in the visualized portion of the upper abdomen. Small sliding-type hiatal hernia. Aortic Atherosclerosis (ICD10-I70.0). Electronically Signed   By: Marijo Conception M.D.   On: 01/09/2022 15:42   DG Chest Portable 1 View  Result Date: 01/09/2022 CLINICAL DATA:  Chest pain. EXAM: PORTABLE CHEST 1 VIEW COMPARISON:  December 23, 2018. FINDINGS: The heart size and mediastinal  contours are within normal limits. Right internal jugular Port-A-Cath is noted with distal tip in expected position of the SVC. Mild bibasilar subsegmental atelectasis is noted. The visualized skeletal structures are unremarkable. IMPRESSION: Mild bibasilar subsegmental atelectasis. Electronically Signed   By: Marijo Conception M.D.   On:  01/09/2022 15:01     EKG: Independently reviewed, with result as described above.    Assessment/Plan   Principal Problem:   Large bowel obstruction (HCC) Active Problems:   COPD (chronic obstructive pulmonary disease) (HCC)   Obstructive uropathy   Bilateral hydronephrosis   AKI (acute kidney injury) (HCC)   Hyperkalemia   Atypical chest pain   Normocytic anemia     #) Bilateral obstructive uropathy: Today CT imaging shows evidence of interval development of moderate bilateral hydronephrosis secondary to probable malignant infiltration of the distal right ureter and extrinsic compression of the distal left ureter secondary to perirectal soft tissue.  Suspect potential contribution from this bilateral postrenal obstruction relating to the patient's acute kidney injury, as further detailed below.  On-call Urologist, Dr. Cain Sieve has been consulted, with associated recommendations regarding cystoscopy with ureteral stent placement versus nephrostomy tube placement, Amellia Panik currently pending at this time.  Plan: Urology consulted, as above.  Check INR.  Continues normal saline at 75 cc/h.  Monitor strict I's and O's and daily weights.  Repeat BMP in the morning.  Further evaluation management of presenting acute kidney injury, as further detailed below.      #) Acute kidney injury: Relative to baseline creatinine of 0.8-0.9, presenting serum creatinine noted to be 1.66.  Potentially multifactorial in nature, with suspected postrenal contribution in the setting of evidence of bilateral ureteral obstruction, in setting of metastatic prostate cancer, as above, with urology consulted, and ensuing recommendations regarding nephrostomy tube placement versus cystoscopy with stent placement pending at this time.  Additionally, there may be also a contribution from prerenal influences given clinical evidence to suggest dehydration in context of patient's report of recent decline in oral intake, as  above.   Plan: IV fluids, as above, under strict I's and O's and daily weights.  Tempt avoid nephrotoxic agents.  Check urinalysis with microscopy.  Add on random urine sent as well as random urine creatinine.  Repeat BMP in the morning.  Nephrology consulted, as above.       #) Partial large bowel obstruction: In the setting of diffuse abdominal discomfort, most prominent in the bilateral lower abdominal quadrants, CT abdomen/pelvis shows evidence of partial diversion of the transverse colon with large stool volume proximal to this point of obstruction.  Is also been associated with nausea, decreased stool output, as well as decline in flatus production.  Dr. Zenia Resides of general surgery has been consulted, with additional recommendations pending at this time.  We will initiate conservative measures for now, as further detailed below.  Plan: NPO.  IV fluids, as above.  Prn morphine.  As needed IV Zofran.  General surgery consulted, as above.  Repeat CBC and CMP in the morning.  Check INR.       #) Subacute anemia: Since October 2022 the patient has demonstrated evidence of normocytic/normochromic anemia in the range of 8-10, suggestive of anemia of chronic disease in the context of known metastatic prostate cancer, with potential additional influence more recently from acute kidney injury, as further discussed above.  No evidence of acute blood loss at this time.  Not on any blood thinners as an outpatient.  Appears hemodynamically stable, and overall asymptomatic  as relates specifically to his subacute anemia.  Potential additional influence from chemotherapy, given onset of anemia relative to her corresponding timeframe for initiation of chemotherapy.   Plan: Add on the following labs to initially collected specimens in ED today: Iron panel, folic acid level, O17 level, reticulocyte count, peripheral smear.  Check INR.  Repeat CBC in the morning.  SCDs.  Further evaluation management of AKI, as  above.        #) Atypical chest pain: Patient reports 3 to 4 days of chest discomfort that is limited to the right portion of the chest, and appears completely reproducible with palpation of the right portion of his anterior chest wall, corresponding to distribution of osseous metastatic metastatic disease observed on CT imaging performed earlier today.  Appears noncardiac in nature, including nonexertional, with EKG showing no evidence of acute ischemic changes, while high-sensitivity troponin I x2 values are nonelevated and trending down, which is particular reassuring given the duration of the patient's chest pain over the last 3 to 4 days.  Plan: Monitor on telemetry.  As needed IV morphine.  Incentive spirometry.      #) Hyperkalemia: Presenting with mildly elevated potassium of 5.3 without corresponding EKG changes.  Suspect contributions from dehydration in the setting of recent decline in oral intake as well as contribution from AKI, as above.  will provide IV fluids, as well as further evaluation management of AKI, with repeat BMP later this evening.  If repeat BMP reflects persistently elevated to potassium level, will consider additional medical management specifically directed towards hyperkalemia at that time.   Plan: Monitor on telemetry.  IV fluids, as above.  Repeat BMP has been ordered for 2330 this evening.  Repeat CMP in the morning.  Add on serum magnesium level.  Further evaluation management of AKI, as above.  Monitor strict I's and O's and daily weights.       #) Intraluminal mural mass within the bladder: Today CT imaging demonstrates interval finding of such, which, per associated radiology report, suspicious for new primary bladder malignancy as opposed to representing a metastatic finding as a consequence of known underlying prostate cancer.  Urology has been consulted, as above.  Plan: Urology consulted, with additional recommendations pending at this  time.       #) COPD: Documented history of such, on Symbicort and as needed albuterol as an outpatient.  No clinical evidence to suggest acute exacerbation thereof at this time.  Plan: Continue Symbicort as well as as needed albuterol.  Particularly in the context of presenting atypical chest pain, I have ordered incentive spirometry.       DVT prophylaxis: SCD's   Code Status: DNR/DNI (per my discussions with the patient this evening) Family Communication: none Disposition Plan: Per Rounding Team Consults called: Dr. Cain Sieve of urology consulted, as further detailed above; Dr. Zenia Resides of general surgery also consulted, as further detailed above.;  Admission status: Inpatient; pcu  Warrants inpatient status on basis of need for further evaluation management of bilateral ureteral obstruction contributing to AKI, including need for potential surgical intervention, whether this be a cystoscopy with ureteral stent placement versus nephrostomy tube placement, including need for close monitoring of ensuing renal function.  Additionally, patient is undergoing further evaluation management of large bowel obstruction, with general surgery consult to follow.   PLEASE NOTE THAT DRAGON DICTATION SOFTWARE WAS USED IN THE CONSTRUCTION OF THIS NOTE.   Barber DO [facility]  From [place]   01/09/2022,  7:56 PM

## 2022-01-10 ENCOUNTER — Inpatient Hospital Stay: Payer: Medicare HMO

## 2022-01-10 ENCOUNTER — Other Ambulatory Visit: Payer: Self-pay

## 2022-01-10 DIAGNOSIS — N179 Acute kidney failure, unspecified: Secondary | ICD-10-CM | POA: Diagnosis not present

## 2022-01-10 DIAGNOSIS — R0789 Other chest pain: Secondary | ICD-10-CM | POA: Diagnosis not present

## 2022-01-10 DIAGNOSIS — N1330 Unspecified hydronephrosis: Secondary | ICD-10-CM | POA: Diagnosis not present

## 2022-01-10 DIAGNOSIS — K56609 Unspecified intestinal obstruction, unspecified as to partial versus complete obstruction: Secondary | ICD-10-CM | POA: Diagnosis not present

## 2022-01-10 LAB — MAGNESIUM
Magnesium: 1.8 mg/dL (ref 1.7–2.4)
Magnesium: 1.8 mg/dL (ref 1.7–2.4)

## 2022-01-10 LAB — CBC WITH DIFFERENTIAL/PLATELET
Abs Immature Granulocytes: 0.03 10*3/uL (ref 0.00–0.07)
Basophils Absolute: 0 10*3/uL (ref 0.0–0.1)
Basophils Relative: 0 %
Eosinophils Absolute: 0 10*3/uL (ref 0.0–0.5)
Eosinophils Relative: 0 %
HCT: 25.5 % — ABNORMAL LOW (ref 39.0–52.0)
Hemoglobin: 8.1 g/dL — ABNORMAL LOW (ref 13.0–17.0)
Immature Granulocytes: 1 %
Lymphocytes Relative: 5 %
Lymphs Abs: 0.2 10*3/uL — ABNORMAL LOW (ref 0.7–4.0)
MCH: 29.7 pg (ref 26.0–34.0)
MCHC: 31.8 g/dL (ref 30.0–36.0)
MCV: 93.4 fL (ref 80.0–100.0)
Monocytes Absolute: 0.2 10*3/uL (ref 0.1–1.0)
Monocytes Relative: 3 %
Neutro Abs: 4.7 10*3/uL (ref 1.7–7.7)
Neutrophils Relative %: 91 %
Platelets: 230 10*3/uL (ref 150–400)
RBC: 2.73 MIL/uL — ABNORMAL LOW (ref 4.22–5.81)
RDW: 20.8 % — ABNORMAL HIGH (ref 11.5–15.5)
WBC: 5.2 10*3/uL (ref 4.0–10.5)
nRBC: 0 % (ref 0.0–0.2)

## 2022-01-10 LAB — URINALYSIS, COMPLETE (UACMP) WITH MICROSCOPIC
Bacteria, UA: NONE SEEN
Bilirubin Urine: NEGATIVE
Glucose, UA: NEGATIVE mg/dL
Ketones, ur: NEGATIVE mg/dL
Leukocytes,Ua: NEGATIVE
Nitrite: NEGATIVE
Protein, ur: 30 mg/dL — AB
RBC / HPF: >50 RBC/hpf (ref 0–5)
Specific Gravity, Urine: 1.015 (ref 1.005–1.030)
pH: 6.5 (ref 5.0–8.0)

## 2022-01-10 LAB — CREATININE, URINE, RANDOM: Creatinine, Urine: 41.43 mg/dL

## 2022-01-10 LAB — COMPREHENSIVE METABOLIC PANEL
ALT: 16 U/L (ref 0–44)
AST: 128 U/L — ABNORMAL HIGH (ref 15–41)
Albumin: 2.9 g/dL — ABNORMAL LOW (ref 3.5–5.0)
Alkaline Phosphatase: 77 U/L (ref 38–126)
Anion gap: 10 (ref 5–15)
BUN: 27 mg/dL — ABNORMAL HIGH (ref 8–23)
CO2: 21 mmol/L — ABNORMAL LOW (ref 22–32)
Calcium: 8.3 mg/dL — ABNORMAL LOW (ref 8.9–10.3)
Chloride: 103 mmol/L (ref 98–111)
Creatinine, Ser: 1.59 mg/dL — ABNORMAL HIGH (ref 0.61–1.24)
GFR, Estimated: 44 mL/min — ABNORMAL LOW (ref >60)
Glucose, Bld: 98 mg/dL (ref 70–99)
Potassium: 5.2 mmol/L — ABNORMAL HIGH (ref 3.5–5.1)
Sodium: 134 mmol/L — ABNORMAL LOW (ref 135–145)
Total Bilirubin: 0.6 mg/dL (ref 0.3–1.2)
Total Protein: 5.8 g/dL — ABNORMAL LOW (ref 6.5–8.1)

## 2022-01-10 LAB — PHOSPHORUS: Phosphorus: 4.7 mg/dL — ABNORMAL HIGH (ref 2.5–4.6)

## 2022-01-10 LAB — RETICULOCYTES
Immature Retic Fract: 5.9 % (ref 2.3–15.9)
RBC.: 2.72 MIL/uL — ABNORMAL LOW (ref 4.22–5.81)
Retic Count, Absolute: 18.5 10*3/uL — ABNORMAL LOW (ref 19.0–186.0)
Retic Ct Pct: 0.7 % (ref 0.4–3.1)

## 2022-01-10 LAB — IRON AND TIBC
Iron: 67 ug/dL (ref 45–182)
Saturation Ratios: 26 % (ref 17.9–39.5)
TIBC: 255 ug/dL (ref 250–450)
UIBC: 188 ug/dL

## 2022-01-10 LAB — FERRITIN: Ferritin: 7091 ng/mL — ABNORMAL HIGH (ref 24–336)

## 2022-01-10 LAB — SODIUM, URINE, RANDOM: Sodium, Ur: 74 mmol/L

## 2022-01-10 LAB — PROTIME-INR
INR: 1.1 (ref 0.8–1.2)
Prothrombin Time: 14.5 seconds (ref 11.4–15.2)

## 2022-01-10 LAB — FOLATE: Folate: 17.3 ng/mL (ref >5.9)

## 2022-01-10 MED ORDER — POLYETHYLENE GLYCOL 3350 17 G PO PACK
17.0000 g | PACK | Freq: Two times a day (BID) | ORAL | Status: DC
  Administered 2022-01-10 – 2022-01-15 (×4): 17 g via ORAL
  Filled 2022-01-10 (×6): qty 1

## 2022-01-10 MED ORDER — HEPARIN SODIUM (PORCINE) 5000 UNIT/ML IJ SOLN
5000.0000 [IU] | Freq: Three times a day (TID) | INTRAMUSCULAR | Status: DC
  Administered 2022-01-10 – 2022-01-15 (×14): 5000 [IU] via SUBCUTANEOUS
  Filled 2022-01-10 (×15): qty 1

## 2022-01-10 MED ORDER — DOCUSATE SODIUM 100 MG PO CAPS
100.0000 mg | ORAL_CAPSULE | Freq: Two times a day (BID) | ORAL | Status: DC
  Administered 2022-01-10 – 2022-01-15 (×7): 100 mg via ORAL
  Filled 2022-01-10 (×9): qty 1

## 2022-01-10 MED ORDER — TBO-FILGRASTIM 480 MCG/0.8ML ~~LOC~~ SOSY
480.0000 ug | PREFILLED_SYRINGE | Freq: Every day | SUBCUTANEOUS | Status: DC
Start: 2022-01-10 — End: 2022-01-15
  Administered 2022-01-10 – 2022-01-14 (×4): 480 ug via SUBCUTANEOUS
  Filled 2022-01-10 (×7): qty 0.8

## 2022-01-10 MED ORDER — BISACODYL 5 MG PO TBEC
10.0000 mg | DELAYED_RELEASE_TABLET | Freq: Once | ORAL | Status: AC
Start: 2022-01-10 — End: 2022-01-10
  Administered 2022-01-10: 10 mg via ORAL

## 2022-01-10 MED ORDER — POLYETHYLENE GLYCOL 3350 17 G PO PACK
34.0000 g | PACK | Freq: Once | ORAL | Status: AC
  Administered 2022-01-10: 34 g via ORAL
  Filled 2022-01-10: qty 2

## 2022-01-10 MED ORDER — LACTULOSE 10 GM/15ML PO SOLN
30.0000 g | Freq: Two times a day (BID) | ORAL | Status: AC
  Administered 2022-01-10 (×2): 30 g via ORAL
  Filled 2022-01-10 (×4): qty 45

## 2022-01-10 MED ORDER — LACTATED RINGERS IV SOLN: INTRAVENOUS | Status: DC

## 2022-01-10 MED ORDER — BISACODYL 10 MG RE SUPP
10.0000 mg | Freq: Two times a day (BID) | RECTAL | Status: DC
  Administered 2022-01-10 – 2022-01-11 (×3): 10 mg via RECTAL
  Filled 2022-01-10 (×2): qty 1

## 2022-01-10 MED ORDER — SODIUM ZIRCONIUM CYCLOSILICATE 10 G PO PACK
10.0000 g | PACK | Freq: Once | ORAL | Status: AC
  Administered 2022-01-10: 10 g via ORAL
  Filled 2022-01-10: qty 1

## 2022-01-10 MED ORDER — CEFAZOLIN SODIUM-DEXTROSE 2-4 GM/100ML-% IV SOLN
2.0000 g | Freq: Once | INTRAVENOUS | Status: AC
  Administered 2022-01-10: 2 g via INTRAVENOUS
  Filled 2022-01-10: qty 100

## 2022-01-10 NOTE — Progress Notes (Addendum)
PROGRESS NOTE    Jose Wolfe  KNL:[id number] DOB: 1946/02/28 DOA: 01/09/2022 PCP: [name], MD   Chief Complaint  Patient presents with   Chest Pain    Brief Narrative:   Jose Wolfe is a 76 y.o. male with medical history significant for metastatic prostate cancer undergoing chemotherapy, COPD, who is admitted to [facility] on 01/09/2022 with obstructive uropathy and acute kidney injury after presenting from home to [facility] ED complaining of chest pain.    Assessment & Plan:   Principal Problem:   Large bowel obstruction (HCC) Active Problems:   COPD (chronic obstructive pulmonary disease) (HCC)   Obstructive uropathy   Bilateral hydronephrosis   AKI (acute kidney injury) (HCC)   Hyperkalemia   Atypical chest pain   Normocytic anemia   Bilateral obstructive uropathy - CT imaging shows evidence of interval development of moderate bilateral hydronephrosis secondary to probable malignant infiltration of the distal right ureter and extrinsic compression of the distal left ureter secondary to perirectal soft tissue. -Management per urology, continue with current conservative management, with close BMP monitoring, if no improvement or worsening renal function likely will need decompression bilateral nephrostomy tube.  AKI -Due to obstructive uropathy, continue with IV fluids, avoid nephrotoxic medications.  Concern for colonic obstruction -General surgery input greatly appreciated, continue with good bowel regimen, will monitor on a clear liquid diet, fortunately no nausea/vomiting. -On MiraLAX twice daily, Dulcolax  twice daily, will give one-time soapsuds enema.and add lactulose X 3 doses  Chest pain -Atypical, most likely due to to osseous metastatic disease, CTA chest negative for PE, EKG nonacute, troponins negative x2.  Hyperkalemia -We will give 1 dose of Lokelma  Intraluminal mural mass within the bladder: -Urology on  board  COPD  -No wheezing, continue with albuterol.  Metastatic prostate cancer -Currently on chemotherapy, he is due for [facility] today, discussed with Dr. Alen Blew, recommendation for Granix x7 days during hospital stay especially he might call procedure.     DVT prophylaxis: Heparin Code Status: DNR Family Communication: None at bedside Disposition:   Status is: Inpatient  Remains inpatient appropriate because: Renal failure, IV fluids       Consultants:  Urology General surgery   Subjective:  Patient denies any nausea, vomiting, no abdominal pain, he denies any urinary complaints currently  Objective: Vitals:   01/10/22 0630 01/10/22 0700 01/10/22 0714 01/10/22 1015  BP: 130/70 114/77  121/72  Pulse: 98 99  94  Resp: 17 (!) 22  18  Temp:   98.3 F (36.8 C)   TempSrc:   Oral   SpO2: 98% 97%  97%  Weight:   70.8 kg   Height:   6' (1.829 m)     Intake/Output Summary (Last 24 hours) at 01/10/2022 1152 Last data filed at 01/09/2022 2107 Gross per 24 hour  Intake 1000 ml  Output --  Net 1000 ml   Filed Weights   01/10/22 0714  Weight: 70.8 kg    Examination:  Awake Alert, Oriented X 3, frail, chronically ill-appearing. Symmetrical Chest wall movement, Good air movement bilaterally, CTAB RRR,No Gallops,Rubs or new Murmurs, No Parasternal Heave +ve B.Sounds, Abd Soft, No tenderness, No rebound - guarding or rigidity. No Cyanosis, Clubbing or edema, No new Rash or bruise      Data Reviewed: I have personally reviewed following labs and imaging studies  CBC: Recent Labs  Lab 01/07/22 0841 01/09/22 1540 01/10/22 0304  WBC 4.9 7.0 5.2  NEUTROABS 3.7 6.6 4.7  HGB 7.7* 8.2* 8.1*  HCT 22.7* 25.7* 25.5*  MCV 90.4 93.5 93.4  PLT 287 247 628    Basic Metabolic Panel: Recent Labs  Lab 01/07/22 0841 01/09/22 1540 01/10/22 0304  NA 136 134* 134*  K 4.6 5.3* 5.2*  CL 104 104 103  CO2 24 20* 21*  GLUCOSE 96 94 98  BUN 29* 31* 27*  CREATININE  1.87* 1.66* 1.59*  CALCIUM 8.8* 9.1 8.3*  MG  --   --  1.8   1.8  PHOS  --   --  4.7*    GFR: Estimated Creatinine Clearance: 39 mL/min (A) (by C-G formula based on SCr of 1.59 mg/dL (H)).  Liver Function Tests: Recent Labs  Lab 01/07/22 0841 01/09/22 1540 01/10/22 0304  AST 23 98* 128*  ALT 14 15 16   ALKPHOS 51 54 77  BILITOT 0.3 0.8 0.6  PROT 6.7 6.0* 5.8*  ALBUMIN 3.7 3.1* 2.9*    CBG: No results for input(s): GLUCAP in the last 168 hours.   Recent Results (from the past 240 hour(s))  Resp Panel by RT-PCR (Flu A&B, Covid) Nasopharyngeal Swab     Status: None   Collection Time: 01/09/22  2:46 PM   Specimen: Nasopharyngeal Swab; Nasopharyngeal(NP) swabs in vial transport medium  Result Value Ref Range Status   SARS Coronavirus 2 by RT PCR NEGATIVE NEGATIVE Final    Comment: (NOTE) SARS-CoV-2 target nucleic acids are NOT DETECTED.  The SARS-CoV-2 RNA is generally detectable in upper respiratory specimens during the acute phase of infection. The lowest concentration of SARS-CoV-2 viral copies this assay can detect is 138 copies/mL. A negative result does not preclude SARS-Cov-2 infection and should not be used as the sole basis for treatment or other patient management decisions. A negative result may occur with  improper specimen collection/handling, submission of specimen other than nasopharyngeal swab, presence of viral mutation(s) within the areas targeted by this assay, and inadequate number of viral copies(<138 copies/mL). A negative result must be combined with clinical observations, patient history, and epidemiological information. The expected result is Negative.  Fact Sheet for Patients:  EntrepreneurPulse.com.au  Fact Sheet for Healthcare Providers:  IncredibleEmployment.be  This test is no t yet approved or cleared by the [place] FDA and  has been authorized for detection and/or diagnosis of SARS-CoV-2 by FDA  under an Emergency Use Authorization (EUA). This EUA will remain  in effect (meaning this test can be used) for the duration of the COVID-19 declaration under Section 564(b)(1) of the Act, 21 U.S.C.section 360bbb-3(b)(1), unless the authorization is terminated  or revoked sooner.       Influenza A by PCR NEGATIVE NEGATIVE Final   Influenza B by PCR NEGATIVE NEGATIVE Final    Comment: (NOTE) The Xpert Xpress SARS-CoV-2/FLU/RSV plus assay is intended as an aid in the diagnosis of influenza from Nasopharyngeal swab specimens and should not be used as a sole basis for treatment. Nasal washings and aspirates are unacceptable for Xpert Xpress SARS-CoV-2/FLU/RSV testing.  Fact Sheet for Patients: EntrepreneurPulse.com.au  Fact Sheet for Healthcare Providers: IncredibleEmployment.be  This test is not yet approved or cleared by the [place] FDA and has been authorized for detection and/or diagnosis of SARS-CoV-2 by FDA under an Emergency Use Authorization (EUA). This EUA will remain in effect (meaning this test can be used) for the duration of the COVID-19 declaration under Section 564(b)(1) of the Act, 21 U.S.C. section 360bbb-3(b)(1), unless the authorization is terminated or revoked.  Performed at [facility]  Hospital Lab, [place] [address], [place], [place] [address]          Radiology Studies: CT ABDOMEN PELVIS WO CONTRAST  Result Date: 01/09/2022 CLINICAL DATA:  Abdominal pain, acute, nonlocalized evaluate for obstruction. bilateral hydronephrosis on CTA. EXAM: CT ABDOMEN AND PELVIS WITHOUT CONTRAST TECHNIQUE: Multidetector CT imaging of the abdomen and pelvis was performed following the standard protocol without IV contrast. RADIATION DOSE REDUCTION: This exam was performed according to the departmental dose-optimization program which includes automated exposure control, adjustment of the mA and/or kV according to patient size and/or use of  iterative reconstruction technique. COMPARISON:  10/04/2019 FINDINGS: Lower chest: No acute abnormality. Moderate coronary artery calcification. Small hiatal hernia. Hepatobiliary: No focal liver abnormality is seen. No gallstones, gallbladder wall thickening, or biliary dilatation. Pancreas: Unremarkable Spleen: Unremarkable Adrenals/Urinary Tract: Dense calcification within the right adrenal gland is in keeping with prior infarction and/or hemorrhage. Left adrenal gland is unremarkable. The kidneys are normal in size and position. There is moderate bilateral hydronephrosis, new from prior PET CT examination of 09/19/2021. Left ureteral distension extends to the level of the a lobulated mass within the left hemipelvis, best seen on image # 60/4. Right ureteral obstruction extends to the level of the distal right ureter just proximal to the right ureterovesicular junction. A discrete obstructing mass is not clearly identified in this location. There is, however, asymmetric bladder wall thickening in the region of the right ureterovesicular junction and circumferential thickening of the distal right ureter which may reflect malignant infiltration in this location, best seen on coronal image # 57-58/7. Additionally, there is a lobulated mural mass within the dome of the bladder measuring roughly 3.9 x 2.8 x 6.3 cm in greatest dimension, best seen on axial image # 70/4. Stomach/Bowel: The transverse colon is redundant. There is an internal hernia present with multiple loops of small bowel extending above the mid transverse colon into the right anterior abdomen. This results in a partial obstruction of the transverse colon with large volume stool proximal to the point of obstruction secondary to mass effect. This is best seen on axial image # 39/4 and coronal image # 23-27. The stomach and small bowel are unremarkable. The appendix is not clearly identified and may be absent. No free intraperitoneal gas or fluid.  Vascular/Lymphatic: As noted above, there is a soft tissue mass within the left perirectal soft tissues measuring 2.7 x 6.1 cm on axial image # 65/4 possibly representing a nodal conglomerate given the findings within the bladder. There is, additionally, pathologic right retrocrural, left periaortic, right pericaval, left pelvic sidewall and right external iliac lymphadenopathy with the index lymph node measuring 3.2 x 3.5 cm at axial image # 69/4. Moderate aortoiliac atherosclerotic calcification. No aortic aneurysm. Reproductive: Brachytherapy seeds are seen within the prostate gland. Other: No abdominal wall hernia. Musculoskeletal: Widespread sclerotic metastases are seen within the visualized axial skeleton, significantly progressed since prior examination. No pathologic fracture identified. IMPRESSION: Interval development of a moderate bilateral hydronephrosis secondary to probable malignant infiltration of the distal right ureter and extrinsic compression of the distal left ureter secondary to perirectal soft tissue possibly representing a metastatic nodal conglomerate. Interval development of asymmetric wall thickening with a superimposed intraluminal mural mass within the bladder suspicious for a primary bladder malignancy. This would be better assessed with cystoscopy. Less likely, this may represent a metastatic mass related to the patient's underlying prostate malignancy. Widespread retroperitoneal metastatic adenopathy, progressive since prior PET CT examination. This can be seen both in  the setting of metastatic bladder and metastatic prostate cancer, however, its presence on prior PET CT examination suggests that this may reflect progressive disease related to the patient's metastatic prostate cancer. Right external iliac pathologic lymphadenopathy would be amenable to percutaneous ultrasound-guided biopsy if clinically indicated. Widespread osteoblastic metastases, significantly progressed since  prior examination, most in keeping with progressive metastatic prostate cancer. Aortic Atherosclerosis (ICD10-I70.0). Electronically Signed   By: Fidela Salisbury M.D.   On: 01/09/2022 17:02   CT Head Wo Contrast  Result Date: 01/09/2022 CLINICAL DATA:  Headache, prostate cancer currently undergoing chemotherapy, began new medication with generalized feeling of unwellness, episode of unresponsiveness and right gaze, apnea EXAM: CT HEAD WITHOUT CONTRAST TECHNIQUE: Contiguous axial images were obtained from the base of the skull through the vertex without intravenous contrast. RADIATION DOSE REDUCTION: This exam was performed according to the departmental dose-optimization program which includes automated exposure control, adjustment of the mA and/or kV according to patient size and/or use of iterative reconstruction technique. COMPARISON:  None. FINDINGS: Brain: No acute infarct or hemorrhage. Lateral ventricles and midline structures are unremarkable. No acute extra-axial fluid collections. No mass effect. Vascular: No hyperdense vessel or unexpected calcification. Skull: Normal. Negative for fracture or focal lesion. Sinuses/Orbits: Partial opacification of the ethmoid air cells. Remaining paranasal sinuses are clear. Other: None. IMPRESSION: 1. No acute intracranial process. Electronically Signed   By: Randa Ngo M.D.   On: 01/09/2022 15:41   CT Angio Chest PE W and/or Wo Contrast  Result Date: 01/09/2022 CLINICAL DATA:  Shortness of breath, chest pain. History of metastatic prostate cancer. EXAM: CT ANGIOGRAPHY CHEST WITH CONTRAST TECHNIQUE: Multidetector CT imaging of the chest was performed using the standard protocol during bolus administration of intravenous contrast. Multiplanar CT image reconstructions and MIPs were obtained to evaluate the vascular anatomy. RADIATION DOSE REDUCTION: This exam was performed according to the departmental dose-optimization program which includes automated exposure  control, adjustment of the mA and/or kV according to patient size and/or use of iterative reconstruction technique. CONTRAST:  22mL OMNIPAQUE IOHEXOL 350 MG/ML SOLN COMPARISON:  July 29, 2006. FINDINGS: Cardiovascular: Satisfactory opacification of the pulmonary arteries to the segmental level. No evidence of pulmonary embolism. Normal heart size. No pericardial effusion. Atherosclerosis of thoracic aorta is noted. Mediastinum/Nodes: Small sliding-type hiatal hernia. No adenopathy is noted. Thyroid gland is unremarkable. Lungs/Pleura: Lungs are clear. No pleural effusion or pneumothorax. Upper Abdomen: There appears to be mild bilateral hydronephrosis. Musculoskeletal: Sclerotic densities are noted throughout the spine and ribs consistent with osseous metastatic disease. Review of the MIP images confirms the above findings. IMPRESSION: No definite evidence of pulmonary embolus. Continued presence of diffuse osseous metastases. Probable bilateral hydronephrosis is seen in the visualized portion of the upper abdomen. Small sliding-type hiatal hernia. Aortic Atherosclerosis (ICD10-I70.0). Electronically Signed   By: Marijo Conception M.D.   On: 01/09/2022 15:42   DG Chest Portable 1 View  Result Date: 01/09/2022 CLINICAL DATA:  Chest pain. EXAM: PORTABLE CHEST 1 VIEW COMPARISON:  December 23, 2018. FINDINGS: The heart size and mediastinal contours are within normal limits. Right internal jugular Port-A-Cath is noted with distal tip in expected position of the SVC. Mild bibasilar subsegmental atelectasis is noted. The visualized skeletal structures are unremarkable. IMPRESSION: Mild bibasilar subsegmental atelectasis. Electronically Signed   By: Marijo Conception M.D.   On: 01/09/2022 15:01        Scheduled Meds:  bisacodyl  10 mg Rectal BID   docusate sodium  100 mg Oral BID  mometasone-formoterol  2 puff Inhalation BID   polyethylene glycol  17 g Oral BID   Continuous Infusions:  sodium chloride 75  mL/hr at 01/10/22 0440     LOS: 1 day       Phillips Climes, MD [facility]   To contact the attending provider between 7A-7P or the covering provider during after hours 7P-7A, please log into the web site [email or web address] and access using universal [place] password for that web site. If you do not have the password, please call the hospital operator.  01/10/2022, 11:52 AM   Patient ID: Cipriano [place]ikan, male   DOB: 1946-02-02, 76 y.o.   MRN: [id number]

## 2022-01-10 NOTE — TOC Initial Note (Signed)
Transition of Care [facility]) - Initial/Assessment Note    Patient Details  Name: Machael Raine MRN: [id number] Date of Birth: [birth year]  [facility] CM/SW Contact:    Verdell Carmine, RN Phone Number: 01/10/2022, 9:27 AM  Clinical Narrative:                    The Transition of Care Department [facility]) has reviewed patient and no TOC needs have been identified at this time. We will continue to monitor patient advancement through interdisciplinary progression rounds. If new patient transition needs arise, please place a TOC consult     Patient Goals and CMS Choice        Expected Discharge Plan and Services                                                Prior Living Arrangements/Services                       Activities of Daily Living      Permission Sought/Granted                  Emotional Assessment              Admission diagnosis:  SBO (small bowel obstruction) ([place]) [K56.609] Patient Active Problem List   Diagnosis Date Noted   Large bowel obstruction ([place]) 01/09/2022   COPD (chronic obstructive pulmonary disease) (HCC)    Obstructive uropathy    Bilateral hydronephrosis    AKI (acute kidney injury) ([place])    Hyperkalemia    Atypical chest pain    Normocytic anemia    Port-A-Cath in place 10/17/2021   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

## 2022-01-10 NOTE — Progress Notes (Signed)
Patient ID: Jose Wolfe, male   DOB: [birth year], 76 y.o.   MRN: [id number] [facility] Surgery Progress Note     Subjective: CC-  No abdominal complaints. Denies abdominal pain, n/v. No flatus or BM. States that he has had issues with constipation and poor PO intake for several weeks. He was started on megace and stool softener 2 days ago. Last BM was 3-4 days ago.  Objective: Vital signs in last 24 hours: Temp:  [97.9 F (36.6 C)-98.3 F (36.8 C)] 98.3 F (36.8 C) (01/14 0714) Pulse Rate:  [89-104] 94 (01/14 1015) Resp:  [10-22] 18 (01/14 1015) BP: (104-141)/(69-86) 121/72 (01/14 1015) SpO2:  [96 %-100 %] 97 % (01/14 1015) Weight:  [70.8 kg] 70.8 kg (01/14 0714)    Intake/Output from previous day: 01/13 0701 - 01/14 0700 In: 1000 [IV Piggyback:1000] Out: -  Intake/Output this shift: No intake/output data recorded.  PE: Gen:  Alert, NAD, pleasant Pulm:  rate and effort normal Abd: Soft, NT/ND, +BS  Lab Results:  Recent Labs    01/09/22 1540 01/10/22 0304  WBC 7.0 5.2  HGB 8.2* 8.1*  HCT 25.7* 25.5*  PLT 247 230   BMET Recent Labs    01/09/22 1540 01/10/22 0304  NA 134* 134*  K 5.3* 5.2*  CL 104 103  CO2 20* 21*  GLUCOSE 94 98  BUN 31* 27*  CREATININE 1.66* 1.59*  CALCIUM 9.1 8.3*   PT/INR Recent Labs    01/10/22 0304  LABPROT 14.5  INR 1.1   CMP     Component Value Date/Time   NA 134 (L) 01/10/2022 0304   K 5.2 (H) 01/10/2022 0304   CL 103 01/10/2022 0304   CO2 21 (L) 01/10/2022 0304   GLUCOSE 98 01/10/2022 0304   BUN 27 (H) 01/10/2022 0304   CREATININE 1.59 (H) 01/10/2022 0304   CREATININE 1.87 (H) 01/07/2022 0841   CALCIUM 8.3 (L) 01/10/2022 0304   PROT 5.8 (L) 01/10/2022 0304   ALBUMIN 2.9 (L) 01/10/2022 0304   AST 128 (H) 01/10/2022 0304   AST 23 01/07/2022 0841   ALT 16 01/10/2022 0304   ALT 14 01/07/2022 0841   ALKPHOS 77 01/10/2022 0304   BILITOT 0.6 01/10/2022 0304   BILITOT 0.3 01/07/2022 0841   GFRNONAA 44  (L) 01/10/2022 0304   GFRNONAA 37 (L) 01/07/2022 0841   GFRAA >60 12/12/2018 1230   Lipase  No results found for: LIPASE     Studies/Results: CT ABDOMEN PELVIS WO CONTRAST  Result Date: 01/09/2022 CLINICAL DATA:  Abdominal pain, acute, nonlocalized evaluate for obstruction. bilateral hydronephrosis on CTA. EXAM: CT ABDOMEN AND PELVIS WITHOUT CONTRAST TECHNIQUE: Multidetector CT imaging of the abdomen and pelvis was performed following the standard protocol without IV contrast. RADIATION DOSE REDUCTION: This exam was performed according to the departmental dose-optimization program which includes automated exposure control, adjustment of the mA and/or kV according to patient size and/or use of iterative reconstruction technique. COMPARISON:  10/04/2019 FINDINGS: Lower chest: No acute abnormality. Moderate coronary artery calcification. Small hiatal hernia. Hepatobiliary: No focal liver abnormality is seen. No gallstones, gallbladder wall thickening, or biliary dilatation. Pancreas: Unremarkable Spleen: Unremarkable Adrenals/Urinary Tract: Dense calcification within the right adrenal gland is in keeping with prior infarction and/or hemorrhage. Left adrenal gland is unremarkable. The kidneys are normal in size and position. There is moderate bilateral hydronephrosis, new from prior PET CT examination of 09/19/2021. Left ureteral distension extends to the level of the a lobulated mass within the left hemipelvis,  best seen on image # 60/4. Right ureteral obstruction extends to the level of the distal right ureter just proximal to the right ureterovesicular junction. A discrete obstructing mass is not clearly identified in this location. There is, however, asymmetric bladder wall thickening in the region of the right ureterovesicular junction and circumferential thickening of the distal right ureter which may reflect malignant infiltration in this location, best seen on coronal image # 57-58/7. Additionally,  there is a lobulated mural mass within the dome of the bladder measuring roughly 3.9 x 2.8 x 6.3 cm in greatest dimension, best seen on axial image # 70/4. Stomach/Bowel: The transverse colon is redundant. There is an internal hernia present with multiple loops of small bowel extending above the mid transverse colon into the right anterior abdomen. This results in a partial obstruction of the transverse colon with large volume stool proximal to the point of obstruction secondary to mass effect. This is best seen on axial image # 39/4 and coronal image # 23-27. The stomach and small bowel are unremarkable. The appendix is not clearly identified and may be absent. No free intraperitoneal gas or fluid. Vascular/Lymphatic: As noted above, there is a soft tissue mass within the left perirectal soft tissues measuring 2.7 x 6.1 cm on axial image # 65/4 possibly representing a nodal conglomerate given the findings within the bladder. There is, additionally, pathologic right retrocrural, left periaortic, right pericaval, left pelvic sidewall and right external iliac lymphadenopathy with the index lymph node measuring 3.2 x 3.5 cm at axial image # 69/4. Moderate aortoiliac atherosclerotic calcification. No aortic aneurysm. Reproductive: Brachytherapy seeds are seen within the prostate gland. Other: No abdominal wall hernia. Musculoskeletal: Widespread sclerotic metastases are seen within the visualized axial skeleton, significantly progressed since prior examination. No pathologic fracture identified. IMPRESSION: Interval development of a moderate bilateral hydronephrosis secondary to probable malignant infiltration of the distal right ureter and extrinsic compression of the distal left ureter secondary to perirectal soft tissue possibly representing a metastatic nodal conglomerate. Interval development of asymmetric wall thickening with a superimposed intraluminal mural mass within the bladder suspicious for a primary  bladder malignancy. This would be better assessed with cystoscopy. Less likely, this may represent a metastatic mass related to the patient's underlying prostate malignancy. Widespread retroperitoneal metastatic adenopathy, progressive since prior PET CT examination. This can be seen both in the setting of metastatic bladder and metastatic prostate cancer, however, its presence on prior PET CT examination suggests that this may reflect progressive disease related to the patient's metastatic prostate cancer. Right external iliac pathologic lymphadenopathy would be amenable to percutaneous ultrasound-guided biopsy if clinically indicated. Widespread osteoblastic metastases, significantly progressed since prior examination, most in keeping with progressive metastatic prostate cancer. Aortic Atherosclerosis (ICD10-I70.0). Electronically Signed   By: Fidela Salisbury M.D.   On: 01/09/2022 17:02   CT Head Wo Contrast  Result Date: 01/09/2022 CLINICAL DATA:  Headache, prostate cancer currently undergoing chemotherapy, began new medication with generalized feeling of unwellness, episode of unresponsiveness and right gaze, apnea EXAM: CT HEAD WITHOUT CONTRAST TECHNIQUE: Contiguous axial images were obtained from the base of the skull through the vertex without intravenous contrast. RADIATION DOSE REDUCTION: This exam was performed according to the departmental dose-optimization program which includes automated exposure control, adjustment of the mA and/or kV according to patient size and/or use of iterative reconstruction technique. COMPARISON:  None. FINDINGS: Brain: No acute infarct or hemorrhage. Lateral ventricles and midline structures are unremarkable. No acute extra-axial fluid collections. No mass effect. Vascular:  No hyperdense vessel or unexpected calcification. Skull: Normal. Negative for fracture or focal lesion. Sinuses/Orbits: Partial opacification of the ethmoid air cells. Remaining paranasal sinuses are  clear. Other: None. IMPRESSION: 1. No acute intracranial process. Electronically Signed   By: Randa Ngo M.D.   On: 01/09/2022 15:41   CT Angio Chest PE W and/or Wo Contrast  Result Date: 01/09/2022 CLINICAL DATA:  Shortness of breath, chest pain. History of metastatic prostate cancer. EXAM: CT ANGIOGRAPHY CHEST WITH CONTRAST TECHNIQUE: Multidetector CT imaging of the chest was performed using the standard protocol during bolus administration of intravenous contrast. Multiplanar CT image reconstructions and MIPs were obtained to evaluate the vascular anatomy. RADIATION DOSE REDUCTION: This exam was performed according to the departmental dose-optimization program which includes automated exposure control, adjustment of the mA and/or kV according to patient size and/or use of iterative reconstruction technique. CONTRAST:  7mL OMNIPAQUE IOHEXOL 350 MG/ML SOLN COMPARISON:  July 29, 2006. FINDINGS: Cardiovascular: Satisfactory opacification of the pulmonary arteries to the segmental level. No evidence of pulmonary embolism. Normal heart size. No pericardial effusion. Atherosclerosis of thoracic aorta is noted. Mediastinum/Nodes: Small sliding-type hiatal hernia. No adenopathy is noted. Thyroid gland is unremarkable. Lungs/Pleura: Lungs are clear. No pleural effusion or pneumothorax. Upper Abdomen: There appears to be mild bilateral hydronephrosis. Musculoskeletal: Sclerotic densities are noted throughout the spine and ribs consistent with osseous metastatic disease. Review of the MIP images confirms the above findings. IMPRESSION: No definite evidence of pulmonary embolus. Continued presence of diffuse osseous metastases. Probable bilateral hydronephrosis is seen in the visualized portion of the upper abdomen. Small sliding-type hiatal hernia. Aortic Atherosclerosis (ICD10-I70.0). Electronically Signed   By: Marijo Conception M.D.   On: 01/09/2022 15:42   DG Chest Portable 1 View  Result Date:  01/09/2022 CLINICAL DATA:  Chest pain. EXAM: PORTABLE CHEST 1 VIEW COMPARISON:  December 23, 2018. FINDINGS: The heart size and mediastinal contours are within normal limits. Right internal jugular Port-A-Cath is noted with distal tip in expected position of the SVC. Mild bibasilar subsegmental atelectasis is noted. The visualized skeletal structures are unremarkable. IMPRESSION: Mild bibasilar subsegmental atelectasis. Electronically Signed   By: Marijo Conception M.D.   On: 01/09/2022 15:01    Anti-infectives: Anti-infectives (From admission, onward)    None        Assessment/Plan Metastatic prostate cancer on chemo Concern for a colonic obstruction - CT scan questions internal hernia and a partial obstruction of the transverse colon, as well as large stool burden proximal to the point of obstruction. It also reports progressive disease since last PET scan despite being on chemo. Patient is a poor surgical candidate. Fortunately he does not have any abdominal complaints. Denies abdominal pain, nausea, or vomiting. Would not recommend any surgical intervention. Ok for diet from our standpoint. Agree with aggressive bowel regimen - he is on BID miralax and dulcolax suppositories.   ID - none FEN - reg diet VTE - SCDs/ per primary Foley - none  Mild moderate bilateral hydronephrosis in the setting of AKI - urology following COPD Atypical chest pain DNR/DNI  Straightforward Medical Decision Making   LOS: 1 day    Wellington Hampshire, [facility] Surgery 01/10/2022, 10:57 AM Please see Amion for pager number during day hours 7:00am-4:30pm

## 2022-01-10 NOTE — Plan of Care (Signed)

## 2022-01-10 NOTE — Progress Notes (Signed)
MD notified that Cefazolin pre-op given as scheduled per [facility] at 1400 today when meant to be given tomorrow pre-op.

## 2022-01-11 ENCOUNTER — Encounter (HOSPITAL_COMMUNITY): Payer: Self-pay | Admitting: Internal Medicine

## 2022-01-11 ENCOUNTER — Inpatient Hospital Stay (HOSPITAL_COMMUNITY): Payer: Medicare HMO

## 2022-01-11 DIAGNOSIS — N179 Acute kidney failure, unspecified: Secondary | ICD-10-CM | POA: Diagnosis not present

## 2022-01-11 DIAGNOSIS — N1330 Unspecified hydronephrosis: Secondary | ICD-10-CM | POA: Diagnosis not present

## 2022-01-11 DIAGNOSIS — K56609 Unspecified intestinal obstruction, unspecified as to partial versus complete obstruction: Secondary | ICD-10-CM | POA: Diagnosis not present

## 2022-01-11 LAB — BASIC METABOLIC PANEL
Anion gap: 8 (ref 5–15)
BUN: 23 mg/dL (ref 8–23)
CO2: 20 mmol/L — ABNORMAL LOW (ref 22–32)
Calcium: 7.6 mg/dL — ABNORMAL LOW (ref 8.9–10.3)
Chloride: 103 mmol/L (ref 98–111)
Creatinine, Ser: 1.63 mg/dL — ABNORMAL HIGH (ref 0.61–1.24)
GFR, Estimated: 43 mL/min — ABNORMAL LOW (ref >60)
Glucose, Bld: 107 mg/dL — ABNORMAL HIGH (ref 70–99)
Potassium: 4.6 mmol/L (ref 3.5–5.1)
Sodium: 131 mmol/L — ABNORMAL LOW (ref 135–145)

## 2022-01-11 LAB — CBC
HCT: 24.2 % — ABNORMAL LOW (ref 39.0–52.0)
Hemoglobin: 8.3 g/dL — ABNORMAL LOW (ref 13.0–17.0)
MCH: 30.7 pg (ref 26.0–34.0)
MCHC: 34.3 g/dL (ref 30.0–36.0)
MCV: 89.6 fL (ref 80.0–100.0)
Platelets: 232 10*3/uL (ref 150–400)
RBC: 2.7 MIL/uL — ABNORMAL LOW (ref 4.22–5.81)
RDW: 20 % — ABNORMAL HIGH (ref 11.5–15.5)
WBC: 6.5 10*3/uL (ref 4.0–10.5)
nRBC: 0 % (ref 0.0–0.2)

## 2022-01-11 LAB — GLUCOSE, CAPILLARY: Glucose-Capillary: 105 mg/dL — ABNORMAL HIGH (ref 70–99)

## 2022-01-11 MED ORDER — SORBITOL 70 % SOLN
960.0000 mL | TOPICAL_OIL | Freq: Once | ORAL | Status: AC
  Administered 2022-01-11: 960 mL via RECTAL
  Filled 2022-01-11: qty 473

## 2022-01-11 MED ORDER — BISACODYL 10 MG RE SUPP
10.0000 mg | Freq: Two times a day (BID) | RECTAL | Status: AC
  Administered 2022-01-11 – 2022-01-12 (×2): 10 mg via RECTAL
  Filled 2022-01-11 (×3): qty 1

## 2022-01-11 MED ORDER — SODIUM CHLORIDE 0.9 % IV SOLN
2.0000 g | INTRAVENOUS | Status: AC
  Administered 2022-01-12: 2 g via INTRAVENOUS
  Filled 2022-01-11: qty 20

## 2022-01-11 NOTE — Progress Notes (Signed)
NG TUBE PLACEMENT- post vitals

## 2022-01-11 NOTE — Progress Notes (Signed)
PROGRESS NOTE    Jose Wolfe  NFA:[id number] DOB: April 11, 1946 DOA: 01/09/2022 PCP: [name], Jose Wolfe   Chief Complaint  Patient presents with   Chest Pain    Brief Narrative:   Jose Wolfe is a 76 y.o. male with medical history significant for metastatic prostate cancer undergoing chemotherapy, COPD, who is admitted to [facility] on 01/09/2022 with obstructive uropathy and acute kidney injury after presenting from home to [facility] ED complaining of chest pain.    Assessment & Plan:   Principal Problem:   Large bowel obstruction (HCC) Active Problems:   COPD (chronic obstructive pulmonary disease) (HCC)   Obstructive uropathy   Bilateral hydronephrosis   AKI (acute kidney injury) (HCC)   Hyperkalemia   Atypical chest pain   Normocytic anemia   Bilateral obstructive uropathy - CT imaging shows evidence of interval development of moderate bilateral hydronephrosis secondary to probable malignant infiltration of the distal right ureter and extrinsic compression of the distal left ureter secondary to perirectal soft tissue. -Management per urology, no improvement with conservative management, they do recommend bilateral nephrostomy with antegrade ureteral stent if possible .  AKI -Due to obstructive uropathy, continue with IV fluids, avoid nephrotoxic medications.  Concern for colonic obstruction -General surgery input greatly appreciated, did develop some nausea and vomiting overnight, KUB confirmed large stool burden, for now he has NG tube, will keep on ILS. -We will continue with laxative regimen, she had couple good bowel movements overnight, but remains with significant stool burden, he received smog enema, will continue with Dulcolax suppository 3 times daily, given no he has NGT and n.p.o., oral laxatives it is limited.    Chest pain -Atypical, most likely due to to osseous metastatic disease, CTA chest negative for PE, EKG nonacute, troponins  negative x2.  Hyperkalemia -resolved with lokelma  Intraluminal mural mass within the bladder: -Urology on board  COPD  -No wheezing, continue with albuterol.  Metastatic prostate cancer -Currently on chemotherapy, he is due for [facility] today, discussed with Dr. Alen Blew, recommendation for Granix x7 days during hospital stay especially he might call procedure.     DVT prophylaxis: Heparin Code Status: DNR Family Communication: None at bedside Disposition:   Status is: Inpatient  Remains inpatient appropriate because: Renal failure, IV fluids       Consultants:  Urology General surgery   Subjective:  Had an episode of vomiting overnight, required NGT on ILS, good BM this morning with suppositories.  Objective: Vitals:   01/11/22 0300 01/11/22 0428 01/11/22 0600 01/11/22 0827  BP: 119/66 108/66 115/70   Pulse: (!) 103  (!) 104   Resp: 14  15   Temp:  98.3 F (36.8 C)    TempSrc:  Oral    SpO2: 96%  96% 96%  Weight:  70.3 kg    Height:        Intake/Output Summary (Last 24 hours) at 01/11/2022 1132 Last data filed at 01/11/2022 0400 Gross per 24 hour  Intake --  Output 1700 ml  Net -1700 ml   Filed Weights   01/10/22 0714 01/11/22 0428  Weight: 70.8 kg 70.3 kg    Examination:  Awake Alert, Oriented X 3, No new F.N deficits, Normal affect Symmetrical Chest wall movement, Good air movement bilaterally, CTAB RRR,No Gallops,Rubs or new Murmurs, No Parasternal Heave +ve B.Sounds, Abd Soft, No tenderness, No rebound - guarding or rigidity. No Cyanosis, Clubbing or edema, No new Rash or bruise  Data Reviewed: I have personally reviewed following labs and imaging studies  CBC: Recent Labs  Lab 01/07/22 0841 01/09/22 1540 01/10/22 0304 01/11/22 0244  WBC 4.9 7.0 5.2 6.5  NEUTROABS 3.7 6.6 4.7  --   HGB 7.7* 8.2* 8.1* 8.3*  HCT 22.7* 25.7* 25.5* 24.2*  MCV 90.4 93.5 93.4 89.6  PLT 287 247 230 277    Basic Metabolic Panel: Recent Labs   Lab 01/07/22 0841 01/09/22 1540 01/10/22 0304 01/11/22 0244  NA 136 134* 134* 131*  K 4.6 5.3* 5.2* 4.6  CL 104 104 103 103  CO2 24 20* 21* 20*  GLUCOSE 96 94 98 107*  BUN 29* 31* 27* 23  CREATININE 1.87* 1.66* 1.59* 1.63*  CALCIUM 8.8* 9.1 8.3* 7.6*  MG  --   --  1.8   1.8  --   PHOS  --   --  4.7*  --     GFR: Estimated Creatinine Clearance: 37.7 mL/min (A) (by C-G formula based on SCr of 1.63 mg/dL (H)).  Liver Function Tests: Recent Labs  Lab 01/07/22 0841 01/09/22 1540 01/10/22 0304  AST 23 98* 128*  ALT 14 15 16   ALKPHOS 51 54 77  BILITOT 0.3 0.8 0.6  PROT 6.7 6.0* 5.8*  ALBUMIN 3.7 3.1* 2.9*    CBG: No results for input(s): GLUCAP in the last 168 hours.   Recent Results (from the past 240 hour(s))  Resp Panel by RT-PCR (Flu A&B, Covid) Nasopharyngeal Swab     Status: None   Collection Time: 01/09/22  2:46 PM   Specimen: Nasopharyngeal Swab; Nasopharyngeal(NP) swabs in vial transport medium  Result Value Ref Range Status   SARS Coronavirus 2 by RT PCR NEGATIVE NEGATIVE Final    Comment: (NOTE) SARS-CoV-2 target nucleic acids are NOT DETECTED.  The SARS-CoV-2 RNA is generally detectable in upper respiratory specimens during the acute phase of infection. The lowest concentration of SARS-CoV-2 viral copies this assay can detect is 138 copies/mL. A negative result does not preclude SARS-Cov-2 infection and should not be used as the sole basis for treatment or other patient management decisions. A negative result may occur with  improper specimen collection/handling, submission of specimen other than nasopharyngeal swab, presence of viral mutation(s) within the areas targeted by this assay, and inadequate number of viral copies(<138 copies/mL). A negative result must be combined with clinical observations, patient history, and epidemiological information. The expected result is Negative.  Fact Sheet for Patients:   EntrepreneurPulse.com.au  Fact Sheet for Healthcare Providers:  IncredibleEmployment.be  This test is no t yet approved or cleared by the [place] FDA and  has been authorized for detection and/or diagnosis of SARS-CoV-2 by FDA under an Emergency Use Authorization (EUA). This EUA will remain  in effect (meaning this test can be used) for the duration of the COVID-19 declaration under Section 564(b)(1) of the Act, 21 U.S.C.section 360bbb-3(b)(1), unless the authorization is terminated  or revoked sooner.       Influenza A by PCR NEGATIVE NEGATIVE Final   Influenza B by PCR NEGATIVE NEGATIVE Final    Comment: (NOTE) The Xpert Xpress SARS-CoV-2/FLU/RSV plus assay is intended as an aid in the diagnosis of influenza from Nasopharyngeal swab specimens and should not be used as a sole basis for treatment. Nasal washings and aspirates are unacceptable for Xpert Xpress SARS-CoV-2/FLU/RSV testing.  Fact Sheet for Patients: EntrepreneurPulse.com.au  Fact Sheet for Healthcare Providers: IncredibleEmployment.be  This test is not yet approved or cleared by the [place]  FDA and has been authorized for detection and/or diagnosis of SARS-CoV-2 by FDA under an Emergency Use Authorization (EUA). This EUA will remain in effect (meaning this test can be used) for the duration of the COVID-19 declaration under Section 564(b)(1) of the Act, 21 U.S.C. section 360bbb-3(b)(1), unless the authorization is terminated or revoked.  Performed at [facility], [place] [address] [place], [place] [address]          Radiology Studies: CT ABDOMEN PELVIS WO CONTRAST  Result Date: 01/09/2022 CLINICAL DATA:  Abdominal pain, acute, nonlocalized evaluate for obstruction. bilateral hydronephrosis on CTA. EXAM: CT ABDOMEN AND PELVIS WITHOUT CONTRAST TECHNIQUE: Multidetector CT imaging of the abdomen and pelvis was performed  following the standard protocol without IV contrast. RADIATION DOSE REDUCTION: This exam was performed according to the departmental dose-optimization program which includes automated exposure control, adjustment of the mA and/or kV according to patient size and/or use of iterative reconstruction technique. COMPARISON:  10/04/2019 FINDINGS: Lower chest: No acute abnormality. Moderate coronary artery calcification. Small hiatal hernia. Hepatobiliary: No focal liver abnormality is seen. No gallstones, gallbladder wall thickening, or biliary dilatation. Pancreas: Unremarkable Spleen: Unremarkable Adrenals/Urinary Tract: Dense calcification within the right adrenal gland is in keeping with prior infarction and/or hemorrhage. Left adrenal gland is unremarkable. The kidneys are normal in size and position. There is moderate bilateral hydronephrosis, new from prior PET CT examination of 09/19/2021. Left ureteral distension extends to the level of the a lobulated mass within the left hemipelvis, best seen on image # 60/4. Right ureteral obstruction extends to the level of the distal right ureter just proximal to the right ureterovesicular junction. A discrete obstructing mass is not clearly identified in this location. There is, however, asymmetric bladder wall thickening in the region of the right ureterovesicular junction and circumferential thickening of the distal right ureter which may reflect malignant infiltration in this location, best seen on coronal image # 57-58/7. Additionally, there is a lobulated mural mass within the dome of the bladder measuring roughly 3.9 x 2.8 x 6.3 cm in greatest dimension, best seen on axial image # 70/4. Stomach/Bowel: The transverse colon is redundant. There is an internal hernia present with multiple loops of small bowel extending above the mid transverse colon into the right anterior abdomen. This results in a partial obstruction of the transverse colon with large volume stool  proximal to the point of obstruction secondary to mass effect. This is best seen on axial image # 39/4 and coronal image # 23-27. The stomach and small bowel are unremarkable. The appendix is not clearly identified and may be absent. No free intraperitoneal gas or fluid. Vascular/Lymphatic: As noted above, there is a soft tissue mass within the left perirectal soft tissues measuring 2.7 x 6.1 cm on axial image # 65/4 possibly representing a nodal conglomerate given the findings within the bladder. There is, additionally, pathologic right retrocrural, left periaortic, right pericaval, left pelvic sidewall and right external iliac lymphadenopathy with the index lymph node measuring 3.2 x 3.5 cm at axial image # 69/4. Moderate aortoiliac atherosclerotic calcification. No aortic aneurysm. Reproductive: Brachytherapy seeds are seen within the prostate gland. Other: No abdominal wall hernia. Musculoskeletal: Widespread sclerotic metastases are seen within the visualized axial skeleton, significantly progressed since prior examination. No pathologic fracture identified. IMPRESSION: Interval development of a moderate bilateral hydronephrosis secondary to probable malignant infiltration of the distal right ureter and extrinsic compression of the distal left ureter secondary to perirectal soft tissue possibly representing a metastatic nodal  conglomerate. Interval development of asymmetric wall thickening with a superimposed intraluminal mural mass within the bladder suspicious for a primary bladder malignancy. This would be better assessed with cystoscopy. Less likely, this may represent a metastatic mass related to the patient's underlying prostate malignancy. Widespread retroperitoneal metastatic adenopathy, progressive since prior PET CT examination. This can be seen both in the setting of metastatic bladder and metastatic prostate cancer, however, its presence on prior PET CT examination suggests that this may reflect  progressive disease related to the patient's metastatic prostate cancer. Right external iliac pathologic lymphadenopathy would be amenable to percutaneous ultrasound-guided biopsy if clinically indicated. Widespread osteoblastic metastases, significantly progressed since prior examination, most in keeping with progressive metastatic prostate cancer. Aortic Atherosclerosis (ICD10-I70.0). Electronically Signed   By: Fidela Salisbury M.D.   On: 01/09/2022 17:02   CT Head Wo Contrast  Result Date: 01/09/2022 CLINICAL DATA:  Headache, prostate cancer currently undergoing chemotherapy, began new medication with generalized feeling of unwellness, episode of unresponsiveness and right gaze, apnea EXAM: CT HEAD WITHOUT CONTRAST TECHNIQUE: Contiguous axial images were obtained from the base of the skull through the vertex without intravenous contrast. RADIATION DOSE REDUCTION: This exam was performed according to the departmental dose-optimization program which includes automated exposure control, adjustment of the mA and/or kV according to patient size and/or use of iterative reconstruction technique. COMPARISON:  None. FINDINGS: Brain: No acute infarct or hemorrhage. Lateral ventricles and midline structures are unremarkable. No acute extra-axial fluid collections. No mass effect. Vascular: No hyperdense vessel or unexpected calcification. Skull: Normal. Negative for fracture or focal lesion. Sinuses/Orbits: Partial opacification of the ethmoid air cells. Remaining paranasal sinuses are clear. Other: None. IMPRESSION: 1. No acute intracranial process. Electronically Signed   By: Randa Ngo M.D.   On: 01/09/2022 15:41   CT Angio Chest PE W and/or Wo Contrast  Result Date: 01/09/2022 CLINICAL DATA:  Shortness of breath, chest pain. History of metastatic prostate cancer. EXAM: CT ANGIOGRAPHY CHEST WITH CONTRAST TECHNIQUE: Multidetector CT imaging of the chest was performed using the standard protocol during bolus  administration of intravenous contrast. Multiplanar CT image reconstructions and MIPs were obtained to evaluate the vascular anatomy. RADIATION DOSE REDUCTION: This exam was performed according to the departmental dose-optimization program which includes automated exposure control, adjustment of the mA and/or kV according to patient size and/or use of iterative reconstruction technique. CONTRAST:  63mL OMNIPAQUE IOHEXOL 350 MG/ML SOLN COMPARISON:  July 29, 2006. FINDINGS: Cardiovascular: Satisfactory opacification of the pulmonary arteries to the segmental level. No evidence of pulmonary embolism. Normal heart size. No pericardial effusion. Atherosclerosis of thoracic aorta is noted. Mediastinum/Nodes: Small sliding-type hiatal hernia. No adenopathy is noted. Thyroid gland is unremarkable. Lungs/Pleura: Lungs are clear. No pleural effusion or pneumothorax. Upper Abdomen: There appears to be mild bilateral hydronephrosis. Musculoskeletal: Sclerotic densities are noted throughout the spine and ribs consistent with osseous metastatic disease. Review of the MIP images confirms the above findings. IMPRESSION: No definite evidence of pulmonary embolus. Continued presence of diffuse osseous metastases. Probable bilateral hydronephrosis is seen in the visualized portion of the upper abdomen. Small sliding-type hiatal hernia. Aortic Atherosclerosis (ICD10-I70.0). Electronically Signed   By: Marijo Conception M.D.   On: 01/09/2022 15:42   DG Chest Portable 1 View  Result Date: 01/09/2022 CLINICAL DATA:  Chest pain. EXAM: PORTABLE CHEST 1 VIEW COMPARISON:  December 23, 2018. FINDINGS: The heart size and mediastinal contours are within normal limits. Right internal jugular Port-A-Cath is noted with distal tip in expected position  of the SVC. Mild bibasilar subsegmental atelectasis is noted. The visualized skeletal structures are unremarkable. IMPRESSION: Mild bibasilar subsegmental atelectasis. Electronically Signed   By:  Marijo Conception M.D.   On: 01/09/2022 15:01   DG Abd Portable 1V  Result Date: 01/11/2022 CLINICAL DATA:  Nasogastric tube placement EXAM: PORTABLE ABDOMEN - 1 VIEW COMPARISON:  Two days ago FINDINGS: The enteric tube tip is at the proximal stomach with side port over the lower esophagus. Artifact from EKG leads. Extensive stool seen in the upper abdomen.  No dilated small bowel IMPRESSION: Enteric tube with tip at the upper stomach and side port at the lower esophagus. Diffuse colonic stool. Electronically Signed   By: Jorje Guild M.D.   On: 01/11/2022 07:23        Scheduled Meds:  bisacodyl  10 mg Rectal BID   docusate sodium  100 mg Oral BID   heparin injection (subcutaneous)  5,000 Units Subcutaneous Q8H   lactulose  30 g Oral BID   mometasone-formoterol  2 puff Inhalation BID   polyethylene glycol  17 g Oral BID   Tbo-filgastrim (GRANIX) SQ  480 mcg Subcutaneous q1800   Continuous Infusions:  sodium chloride 75 mL/hr at 01/11/22 0653   lactated ringers 10 mL/hr at 01/10/22 1415     LOS: 2 days       Jose Climes, Jose Wolfe [facility]   To contact the attending provider between 7A-7P or the covering provider during after hours 7P-7A, please log into the web site [email or web address] and access using universal [place] password for that web site. If you do not have the password, please call the hospital operator.  01/11/2022, 11:32 AM   Patient ID: Corderius Saraceni, male   DOB: October 29, 1946, 76 y.o.   MRN: [id number] Patient ID: Christoher Drudge, male   DOB: Aug 20, 1946, 76 y.o.   MRN: [id number]

## 2022-01-11 NOTE — Consult Note (Signed)
Chief Complaint: Patient was seen in consultation today for bilateral hydronephrosis  Referring Physician(s): Dr. Cain Sieve  Supervising Physician: Arne Cleveland  Patient Status: [facility] - In-pt  History of Present Illness: Jose Wolfe is a 76 y.o. male with past medical history of COPD and metastatic prostrate cancer admitted with abdominal pain, constipation, decreased urine output, and chest pain.  He was found to have acute kidney injury with elevated Scr of 1.87 (baseline 0.89). Urology was consulted and noted patient with obstructive uropathy related to his prostate cancer.  He did undergo recent cystoscopy during which the ureteral orifices could not be identified.  Given his creatinine is unchanged over the past few days despite hydration, IR consulted for bilateral percutaneous nephrostomy tube placements.   Case reviewed and approved by Dr. Vernard Gambles.   Patient assessed at bedside.  NGT in place to suction. Reports he is hungry. No BM or flatus since admission. Surgery following for possible colonic obstruction.  Discussed obstructive uropathy and the role of percutaneous nephrostomy tube placement with possible JJ stents.  Patient is agreeable to proceed.   Past Medical History:  Diagnosis Date   COPD (chronic obstructive pulmonary disease) ([place])    Prostate cancer [facility])     Past Surgical History:  Procedure Laterality Date   IR IMAGING GUIDED PORT INSERTION  10/09/2021   RADIOACTIVE SEED IMPLANT  2011   TONSILLECTOMY     TRANSURETHRAL RESECTION OF BLADDER TUMOR N/A 12/15/2018   Procedure: CYSTOSCOPY TRANSURETHRAL RESECTION OF BLADDER TUMOR (TURBT);  Surgeon: Irine Seal, MD;  Location: [facility] ORS;  Service: Urology;  Laterality: N/A;    Allergies: Atorvastatin  Medications: Prior to Admission medications   Medication Sig Start Date End Date Taking? Authorizing Provider  B Complex Vitamins (VITAMIN B COMPLEX) TABS Take 1 tablet by mouth daily.   Yes [provider]  betamethasone dipropionate (DIPROLENE) 0.05 % cream Apply 1 application topically 2 (two) times daily as needed (for psoriasis).  10/28/18  Yes [provider]  budesonide-formoterol (SYMBICORT) 160-4.5 MCG/ACT inhaler Inhale 2 puffs into the lungs 2 (two) times daily.    Yes [provider]  calcium-vitamin D (OSCAL WITH D) 500-200 MG-UNIT tablet Take 1 tablet by mouth.   Yes [provider]  Cyanocobalamin (VITAMIN B 12 PO) Take 1 tablet by mouth daily.   Yes [provider]  guaiFENesin (MUCINEX) 600 MG 12 hr tablet Take 600 mg by mouth 2 (two) times daily as needed for cough.   Yes [provider]  lidocaine-prilocaine (EMLA) cream Apply 1 application topically as needed. Patient taking differently: Apply 1 application topically as needed (access port). 09/23/21  Yes Wyatt Portela, MD  megestrol (MEGACE ES) 625 MG/5ML suspension Take 5 mLs (625 mg total) by mouth daily. 01/02/22  Yes Wyatt Portela, MD  prochlorperazine (COMPAZINE) 10 MG tablet Take 1 tablet (10 mg total) by mouth every 6 (six) hours as needed for nausea or vomiting. 11/05/21  Yes Wyatt Portela, MD  pseudoephedrine-acetaminophen (TYLENOL SINUS) 30-500 MG TABS tablet Take 1 tablet by mouth every 4 (four) hours as needed (sinus congestion).   Yes [provider]  senna-docusate (SENNA S) 8.6-50 MG tablet Take 1 tablet by mouth at bedtime as needed for mild constipation. 01/07/22  Yes Wyatt Portela, MD  triamcinolone cream (KENALOG) 0.1 % Apply 1 application topically 2 (two) times daily as needed (for psoriasis). 10/28/18  Yes [provider]  VENTOLIN HFA 108 (90 Base) MCG/ACT inhaler INHALE  1 PUFF BY MOUTH EVERY 4 HOURS AS NEEDED 09/10/21  Yes Croitoru, Mihai, MD  leuprolide, 6 Month, (LEUPROLIDE ACETATE, 6 MONTH,) 45 MG injection See admin instructions.    [provider]  metoprolol tartrate (LOPRESSOR) 50 MG tablet Take one tablet two hours  prior to the test 10/31/21   Croitoru, Mihai, MD  predniSONE (DELTASONE) 5 MG tablet Take 5 mg by mouth daily. 06/02/21   [provider]     Family History  Problem Relation Age of Onset   Prostate cancer Brother    Cancer Maternal Uncle        unknown type    Social History   Socioeconomic History   Marital status: Married    Spouse name: Not on file   Number of children: 0   Years of education: Not on file   Highest education level: Not on file  Occupational History   Not on file  Tobacco Use   Smoking status: Never   Smokeless tobacco: Never  Vaping Use   Vaping Use: Never used  Substance and Sexual Activity   Alcohol use: Yes    Alcohol/week: 1.0 standard drink    Types: 1 Glasses of wine per week    Comment: daily with dinner   Drug use: No   Sexual activity: Not Currently  Other Topics Concern   Not on file  Social History Narrative   Not on file   Social Determinants of Health   Financial Resource Strain: Not on file  Food Insecurity: Not on file  Transportation Needs: Not on file  Physical Activity: Not on file  Stress: Not on file  Social Connections: Not on file     Review of Systems: A 12 point ROS discussed and pertinent positives are indicated in the HPI above.  All other systems are negative.  Review of Systems  Constitutional:  Negative for fatigue and fever.  Respiratory:  Negative for cough and shortness of breath.   Cardiovascular:  Positive for chest pain.  Gastrointestinal:  Positive for abdominal pain and constipation. Negative for diarrhea and nausea.  Genitourinary:  Positive for decreased urine volume.  Musculoskeletal:  Negative for back pain.  Psychiatric/Behavioral:  Negative for behavioral problems and confusion.    Vital Signs: BP 115/70 (BP Location: Right Arm)    Pulse (!) 104    Temp 98.3 F (36.8 C) (Oral)    Resp 15    Ht 6' (1.829 m)    Wt 154 lb 15.7 oz (70.3 kg)    SpO2 96%    BMI 21.02 kg/m   Physical  Exam Vitals and nursing note reviewed.  Constitutional:      General: He is not in acute distress.    Appearance: He is well-developed. He is not ill-appearing.  Cardiovascular:     Rate and Rhythm: Normal rate and regular rhythm.  Pulmonary:     Effort: Pulmonary effort is normal.     Breath sounds: Normal breath sounds.  Skin:    General: Skin is warm and dry.  Neurological:     General: No focal deficit present.     Mental Status: He is alert and oriented to person, place, and time.  Psychiatric:        Mood and Affect: Mood normal.        Behavior: Behavior normal.     MD Evaluation Airway: WNL Heart: WNL Abdomen: WNL Chest/ Lungs: WNL ASA  Classification: 3 Mallampati/Airway Score: Two   Imaging: CT ABDOMEN  PELVIS WO CONTRAST  Result Date: 01/09/2022 CLINICAL DATA:  Abdominal pain, acute, nonlocalized evaluate for obstruction. bilateral hydronephrosis on CTA. EXAM: CT ABDOMEN AND PELVIS WITHOUT CONTRAST TECHNIQUE: Multidetector CT imaging of the abdomen and pelvis was performed following the standard protocol without IV contrast. RADIATION DOSE REDUCTION: This exam was performed according to the departmental dose-optimization program which includes automated exposure control, adjustment of the mA and/or kV according to patient size and/or use of iterative reconstruction technique. COMPARISON:  10/04/2019 FINDINGS: Lower chest: No acute abnormality. Moderate coronary artery calcification. Small hiatal hernia. Hepatobiliary: No focal liver abnormality is seen. No gallstones, gallbladder wall thickening, or biliary dilatation. Pancreas: Unremarkable Spleen: Unremarkable Adrenals/Urinary Tract: Dense calcification within the right adrenal gland is in keeping with prior infarction and/or hemorrhage. Left adrenal gland is unremarkable. The kidneys are normal in size and position. There is moderate bilateral hydronephrosis, new from prior PET CT examination of 09/19/2021. Left ureteral  distension extends to the level of the a lobulated mass within the left hemipelvis, best seen on image # 60/4. Right ureteral obstruction extends to the level of the distal right ureter just proximal to the right ureterovesicular junction. A discrete obstructing mass is not clearly identified in this location. There is, however, asymmetric bladder wall thickening in the region of the right ureterovesicular junction and circumferential thickening of the distal right ureter which may reflect malignant infiltration in this location, best seen on coronal image # 57-58/7. Additionally, there is a lobulated mural mass within the dome of the bladder measuring roughly 3.9 x 2.8 x 6.3 cm in greatest dimension, best seen on axial image # 70/4. Stomach/Bowel: The transverse colon is redundant. There is an internal hernia present with multiple loops of small bowel extending above the mid transverse colon into the right anterior abdomen. This results in a partial obstruction of the transverse colon with large volume stool proximal to the point of obstruction secondary to mass effect. This is best seen on axial image # 39/4 and coronal image # 23-27. The stomach and small bowel are unremarkable. The appendix is not clearly identified and may be absent. No free intraperitoneal gas or fluid. Vascular/Lymphatic: As noted above, there is a soft tissue mass within the left perirectal soft tissues measuring 2.7 x 6.1 cm on axial image # 65/4 possibly representing a nodal conglomerate given the findings within the bladder. There is, additionally, pathologic right retrocrural, left periaortic, right pericaval, left pelvic sidewall and right external iliac lymphadenopathy with the index lymph node measuring 3.2 x 3.5 cm at axial image # 69/4. Moderate aortoiliac atherosclerotic calcification. No aortic aneurysm. Reproductive: Brachytherapy seeds are seen within the prostate gland. Other: No abdominal wall hernia. Musculoskeletal:  Widespread sclerotic metastases are seen within the visualized axial skeleton, significantly progressed since prior examination. No pathologic fracture identified. IMPRESSION: Interval development of a moderate bilateral hydronephrosis secondary to probable malignant infiltration of the distal right ureter and extrinsic compression of the distal left ureter secondary to perirectal soft tissue possibly representing a metastatic nodal conglomerate. Interval development of asymmetric wall thickening with a superimposed intraluminal mural mass within the bladder suspicious for a primary bladder malignancy. This would be better assessed with cystoscopy. Less likely, this may represent a metastatic mass related to the patient's underlying prostate malignancy. Widespread retroperitoneal metastatic adenopathy, progressive since prior PET CT examination. This can be seen both in the setting of metastatic bladder and metastatic prostate cancer, however, its presence on prior PET CT examination suggests that this may reflect  progressive disease related to the patient's metastatic prostate cancer. Right external iliac pathologic lymphadenopathy would be amenable to percutaneous ultrasound-guided biopsy if clinically indicated. Widespread osteoblastic metastases, significantly progressed since prior examination, most in keeping with progressive metastatic prostate cancer. Aortic Atherosclerosis (ICD10-I70.0). Electronically Signed   By: Fidela Salisbury M.D.   On: 01/09/2022 17:02   CT Head Wo Contrast  Result Date: 01/09/2022 CLINICAL DATA:  Headache, prostate cancer currently undergoing chemotherapy, began new medication with generalized feeling of unwellness, episode of unresponsiveness and right gaze, apnea EXAM: CT HEAD WITHOUT CONTRAST TECHNIQUE: Contiguous axial images were obtained from the base of the skull through the vertex without intravenous contrast. RADIATION DOSE REDUCTION: This exam was performed according to  the departmental dose-optimization program which includes automated exposure control, adjustment of the mA and/or kV according to patient size and/or use of iterative reconstruction technique. COMPARISON:  None. FINDINGS: Brain: No acute infarct or hemorrhage. Lateral ventricles and midline structures are unremarkable. No acute extra-axial fluid collections. No mass effect. Vascular: No hyperdense vessel or unexpected calcification. Skull: Normal. Negative for fracture or focal lesion. Sinuses/Orbits: Partial opacification of the ethmoid air cells. Remaining paranasal sinuses are clear. Other: None. IMPRESSION: 1. No acute intracranial process. Electronically Signed   By: Randa Ngo M.D.   On: 01/09/2022 15:41   CT Angio Chest PE W and/or Wo Contrast  Result Date: 01/09/2022 CLINICAL DATA:  Shortness of breath, chest pain. History of metastatic prostate cancer. EXAM: CT ANGIOGRAPHY CHEST WITH CONTRAST TECHNIQUE: Multidetector CT imaging of the chest was performed using the standard protocol during bolus administration of intravenous contrast. Multiplanar CT image reconstructions and MIPs were obtained to evaluate the vascular anatomy. RADIATION DOSE REDUCTION: This exam was performed according to the departmental dose-optimization program which includes automated exposure control, adjustment of the mA and/or kV according to patient size and/or use of iterative reconstruction technique. CONTRAST:  80mL OMNIPAQUE IOHEXOL 350 MG/ML SOLN COMPARISON:  July 29, 2006. FINDINGS: Cardiovascular: Satisfactory opacification of the pulmonary arteries to the segmental level. No evidence of pulmonary embolism. Normal heart size. No pericardial effusion. Atherosclerosis of thoracic aorta is noted. Mediastinum/Nodes: Small sliding-type hiatal hernia. No adenopathy is noted. Thyroid gland is unremarkable. Lungs/Pleura: Lungs are clear. No pleural effusion or pneumothorax. Upper Abdomen: There appears to be mild bilateral  hydronephrosis. Musculoskeletal: Sclerotic densities are noted throughout the spine and ribs consistent with osseous metastatic disease. Review of the MIP images confirms the above findings. IMPRESSION: No definite evidence of pulmonary embolus. Continued presence of diffuse osseous metastases. Probable bilateral hydronephrosis is seen in the visualized portion of the upper abdomen. Small sliding-type hiatal hernia. Aortic Atherosclerosis (ICD10-I70.0). Electronically Signed   By: Marijo Conception M.D.   On: 01/09/2022 15:42   DG Chest Portable 1 View  Result Date: 01/09/2022 CLINICAL DATA:  Chest pain. EXAM: PORTABLE CHEST 1 VIEW COMPARISON:  December 23, 2018. FINDINGS: The heart size and mediastinal contours are within normal limits. Right internal jugular Port-A-Cath is noted with distal tip in expected position of the SVC. Mild bibasilar subsegmental atelectasis is noted. The visualized skeletal structures are unremarkable. IMPRESSION: Mild bibasilar subsegmental atelectasis. Electronically Signed   By: Marijo Conception M.D.   On: 01/09/2022 15:01   DG Abd Portable 1V  Result Date: 01/11/2022 CLINICAL DATA:  Nasogastric tube placement EXAM: PORTABLE ABDOMEN - 1 VIEW COMPARISON:  Two days ago FINDINGS: The enteric tube tip is at the proximal stomach with side port over the lower esophagus. Artifact from EKG  leads. Extensive stool seen in the upper abdomen.  No dilated small bowel IMPRESSION: Enteric tube with tip at the upper stomach and side port at the lower esophagus. Diffuse colonic stool. Electronically Signed   By: Jorje Guild M.D.   On: 01/11/2022 07:23    Labs:  CBC: Recent Labs    01/07/22 0841 01/09/22 1540 01/10/22 0304 01/11/22 0244  WBC 4.9 7.0 5.2 6.5  HGB 7.7* 8.2* 8.1* 8.3*  HCT 22.7* 25.7* 25.5* 24.2*  PLT 287 247 230 232    COAGS: Recent Labs    01/10/22 0304  INR 1.1    BMP: Recent Labs    01/07/22 0841 01/09/22 1540 01/10/22 0304 01/11/22 0244  NA 136  134* 134* 131*  K 4.6 5.3* 5.2* 4.6  CL 104 104 103 103  CO2 24 20* 21* 20*  GLUCOSE 96 94 98 107*  BUN 29* 31* 27* 23  CALCIUM 8.8* 9.1 8.3* 7.6*  CREATININE 1.87* 1.66* 1.59* 1.63*  GFRNONAA 37* 42* 44* 43*    LIVER FUNCTION TESTS: Recent Labs    12/17/21 0936 01/07/22 0841 01/09/22 1540 01/10/22 0304  BILITOT 0.4 0.3 0.8 0.6  AST 28 23 98* 128*  ALT 19 14 15 16   ALKPHOS 63 51 54 77  PROT 6.8 6.7 6.0* 5.8*  ALBUMIN 3.9 3.7 3.1* 2.9*    TUMOR MARKERS: No results for input(s): AFPTM, CEA, CA199, CHROMGRNA in the last 8760 hours.  Assessment and Plan: Bilateral hydronephrosis Patient with metastatic prostate cancer admitted with signs of obstructive uropathy.  Urology has assessed the patient and recommends percutaneous nephrostomy tube placements.  Dr. Vernard Gambles has reviewed the case and approves the patient for bilateral percutaneous nephrostomy tube placements with attempt to place antegrade JJ stents.   Discussed with patient at bedside who is agreeable to proceed.  Asking for something to eat, however with NGT to suction and is NPO presently.  NPO p MN for possible procedure.  Heparin held.  Prophylactic ceftriaxone ordered.  Risks and benefits of bilateral PCN placement was discussed with the patient including, but not limited to, infection, bleeding, significant bleeding causing loss or decrease in renal function or damage to adjacent structures.   All of the patient's questions were answered, patient is agreeable to proceed.  Consent signed and in chart.  Thank you for this interesting consult.  I greatly enjoyed meeting Jose Wolfe and look forward to participating in their care.  A copy of this report was sent to the requesting provider on this date.  Electronically Signed: Docia Barrier, PA 01/11/2022, 2:22 PM   I spent a total of 40 Minutes    in face to face in clinical consultation, greater than 50% of which was  counseling/coordinating care for obstructive uropathy, metastatic prostate cancer.

## 2022-01-11 NOTE — Progress Notes (Signed)
Patient ID: Jose Wolfe, male   DOB: 06-14-1946, 76 y.o.   MRN: [id number] [facility] Surgery Progress Note     Subjective: CC-  No abdominal complaints. Denies abdominal pain, n/v. No flatus or BM. States that he has had issues with constipation and poor PO intake for several weeks. He was started on megace and stool softener 2 days ago. Last BM was 4 days ago.  Objective: Vital signs in last 24 hours: Temp:  [98.1 F (36.7 C)-99.9 F (37.7 C)] 98.3 F (36.8 C) (01/15 0428) Pulse Rate:  [94-104] 104 (01/15 0600) Resp:  [14-20] 15 (01/15 0600) BP: (108-133)/(66-72) 115/70 (01/15 0600) SpO2:  [96 %-98 %] 96 % (01/15 0827) Weight:  [70.3 kg] 70.3 kg (01/15 0428) Last BM Date: 01/06/22  Intake/Output from previous day: 01/14 0701 - 01/15 0700 In: -  Out: 1700 [Urine:1700] Intake/Output this shift: No intake/output data recorded.  PE: Gen:  Alert, NAD, pleasant Pulm:  rate and effort normal Abd: Soft, NT/ND, +BS  Lab Results:  Recent Labs    01/10/22 0304 01/11/22 0244  WBC 5.2 6.5  HGB 8.1* 8.3*  HCT 25.5* 24.2*  PLT 230 232   BMET Recent Labs    01/10/22 0304 01/11/22 0244  NA 134* 131*  K 5.2* 4.6  CL 103 103  CO2 21* 20*  GLUCOSE 98 107*  BUN 27* 23  CREATININE 1.59* 1.63*  CALCIUM 8.3* 7.6*   PT/INR Recent Labs    01/10/22 0304  LABPROT 14.5  INR 1.1   CMP     Component Value Date/Time   NA 131 (L) 01/11/2022 0244   K 4.6 01/11/2022 0244   CL 103 01/11/2022 0244   CO2 20 (L) 01/11/2022 0244   GLUCOSE 107 (H) 01/11/2022 0244   BUN 23 01/11/2022 0244   CREATININE 1.63 (H) 01/11/2022 0244   CREATININE 1.87 (H) 01/07/2022 0841   CALCIUM 7.6 (L) 01/11/2022 0244   PROT 5.8 (L) 01/10/2022 0304   ALBUMIN 2.9 (L) 01/10/2022 0304   AST 128 (H) 01/10/2022 0304   AST 23 01/07/2022 0841   ALT 16 01/10/2022 0304   ALT 14 01/07/2022 0841   ALKPHOS 77 01/10/2022 0304   BILITOT 0.6 01/10/2022 0304   BILITOT 0.3 01/07/2022 0841    GFRNONAA 43 (L) 01/11/2022 0244   GFRNONAA 37 (L) 01/07/2022 0841   GFRAA >60 12/12/2018 1230   Lipase  No results found for: LIPASE     Studies/Results: CT ABDOMEN PELVIS WO CONTRAST  Result Date: 01/09/2022 CLINICAL DATA:  Abdominal pain, acute, nonlocalized evaluate for obstruction. bilateral hydronephrosis on CTA. EXAM: CT ABDOMEN AND PELVIS WITHOUT CONTRAST TECHNIQUE: Multidetector CT imaging of the abdomen and pelvis was performed following the standard protocol without IV contrast. RADIATION DOSE REDUCTION: This exam was performed according to the departmental dose-optimization program which includes automated exposure control, adjustment of the mA and/or kV according to patient size and/or use of iterative reconstruction technique. COMPARISON:  10/04/2019 FINDINGS: Lower chest: No acute abnormality. Moderate coronary artery calcification. Small hiatal hernia. Hepatobiliary: No focal liver abnormality is seen. No gallstones, gallbladder wall thickening, or biliary dilatation. Pancreas: Unremarkable Spleen: Unremarkable Adrenals/Urinary Tract: Dense calcification within the right adrenal gland is in keeping with prior infarction and/or hemorrhage. Left adrenal gland is unremarkable. The kidneys are normal in size and position. There is moderate bilateral hydronephrosis, new from prior PET CT examination of 09/19/2021. Left ureteral distension extends to the level of the a lobulated mass within the left hemipelvis,  best seen on image # 60/4. Right ureteral obstruction extends to the level of the distal right ureter just proximal to the right ureterovesicular junction. A discrete obstructing mass is not clearly identified in this location. There is, however, asymmetric bladder wall thickening in the region of the right ureterovesicular junction and circumferential thickening of the distal right ureter which may reflect malignant infiltration in this location, best seen on coronal image # 57-58/7.  Additionally, there is a lobulated mural mass within the dome of the bladder measuring roughly 3.9 x 2.8 x 6.3 cm in greatest dimension, best seen on axial image # 70/4. Stomach/Bowel: The transverse colon is redundant. There is an internal hernia present with multiple loops of small bowel extending above the mid transverse colon into the right anterior abdomen. This results in a partial obstruction of the transverse colon with large volume stool proximal to the point of obstruction secondary to mass effect. This is best seen on axial image # 39/4 and coronal image # 23-27. The stomach and small bowel are unremarkable. The appendix is not clearly identified and may be absent. No free intraperitoneal gas or fluid. Vascular/Lymphatic: As noted above, there is a soft tissue mass within the left perirectal soft tissues measuring 2.7 x 6.1 cm on axial image # 65/4 possibly representing a nodal conglomerate given the findings within the bladder. There is, additionally, pathologic right retrocrural, left periaortic, right pericaval, left pelvic sidewall and right external iliac lymphadenopathy with the index lymph node measuring 3.2 x 3.5 cm at axial image # 69/4. Moderate aortoiliac atherosclerotic calcification. No aortic aneurysm. Reproductive: Brachytherapy seeds are seen within the prostate gland. Other: No abdominal wall hernia. Musculoskeletal: Widespread sclerotic metastases are seen within the visualized axial skeleton, significantly progressed since prior examination. No pathologic fracture identified. IMPRESSION: Interval development of a moderate bilateral hydronephrosis secondary to probable malignant infiltration of the distal right ureter and extrinsic compression of the distal left ureter secondary to perirectal soft tissue possibly representing a metastatic nodal conglomerate. Interval development of asymmetric wall thickening with a superimposed intraluminal mural mass within the bladder suspicious for a  primary bladder malignancy. This would be better assessed with cystoscopy. Less likely, this may represent a metastatic mass related to the patient's underlying prostate malignancy. Widespread retroperitoneal metastatic adenopathy, progressive since prior PET CT examination. This can be seen both in the setting of metastatic bladder and metastatic prostate cancer, however, its presence on prior PET CT examination suggests that this may reflect progressive disease related to the patient's metastatic prostate cancer. Right external iliac pathologic lymphadenopathy would be amenable to percutaneous ultrasound-guided biopsy if clinically indicated. Widespread osteoblastic metastases, significantly progressed since prior examination, most in keeping with progressive metastatic prostate cancer. Aortic Atherosclerosis (ICD10-I70.0). Electronically Signed   By: Fidela Salisbury M.D.   On: 01/09/2022 17:02   CT Head Wo Contrast  Result Date: 01/09/2022 CLINICAL DATA:  Headache, prostate cancer currently undergoing chemotherapy, began new medication with generalized feeling of unwellness, episode of unresponsiveness and right gaze, apnea EXAM: CT HEAD WITHOUT CONTRAST TECHNIQUE: Contiguous axial images were obtained from the base of the skull through the vertex without intravenous contrast. RADIATION DOSE REDUCTION: This exam was performed according to the departmental dose-optimization program which includes automated exposure control, adjustment of the mA and/or kV according to patient size and/or use of iterative reconstruction technique. COMPARISON:  None. FINDINGS: Brain: No acute infarct or hemorrhage. Lateral ventricles and midline structures are unremarkable. No acute extra-axial fluid collections. No mass effect. Vascular:  No hyperdense vessel or unexpected calcification. Skull: Normal. Negative for fracture or focal lesion. Sinuses/Orbits: Partial opacification of the ethmoid air cells. Remaining paranasal sinuses  are clear. Other: None. IMPRESSION: 1. No acute intracranial process. Electronically Signed   By: Randa Ngo M.D.   On: 01/09/2022 15:41   CT Angio Chest PE W and/or Wo Contrast  Result Date: 01/09/2022 CLINICAL DATA:  Shortness of breath, chest pain. History of metastatic prostate cancer. EXAM: CT ANGIOGRAPHY CHEST WITH CONTRAST TECHNIQUE: Multidetector CT imaging of the chest was performed using the standard protocol during bolus administration of intravenous contrast. Multiplanar CT image reconstructions and MIPs were obtained to evaluate the vascular anatomy. RADIATION DOSE REDUCTION: This exam was performed according to the departmental dose-optimization program which includes automated exposure control, adjustment of the mA and/or kV according to patient size and/or use of iterative reconstruction technique. CONTRAST:  2mL OMNIPAQUE IOHEXOL 350 MG/ML SOLN COMPARISON:  July 29, 2006. FINDINGS: Cardiovascular: Satisfactory opacification of the pulmonary arteries to the segmental level. No evidence of pulmonary embolism. Normal heart size. No pericardial effusion. Atherosclerosis of thoracic aorta is noted. Mediastinum/Nodes: Small sliding-type hiatal hernia. No adenopathy is noted. Thyroid gland is unremarkable. Lungs/Pleura: Lungs are clear. No pleural effusion or pneumothorax. Upper Abdomen: There appears to be mild bilateral hydronephrosis. Musculoskeletal: Sclerotic densities are noted throughout the spine and ribs consistent with osseous metastatic disease. Review of the MIP images confirms the above findings. IMPRESSION: No definite evidence of pulmonary embolus. Continued presence of diffuse osseous metastases. Probable bilateral hydronephrosis is seen in the visualized portion of the upper abdomen. Small sliding-type hiatal hernia. Aortic Atherosclerosis (ICD10-I70.0). Electronically Signed   By: Marijo Conception M.D.   On: 01/09/2022 15:42   DG Chest Portable 1 View  Result Date:  01/09/2022 CLINICAL DATA:  Chest pain. EXAM: PORTABLE CHEST 1 VIEW COMPARISON:  December 23, 2018. FINDINGS: The heart size and mediastinal contours are within normal limits. Right internal jugular Port-A-Cath is noted with distal tip in expected position of the SVC. Mild bibasilar subsegmental atelectasis is noted. The visualized skeletal structures are unremarkable. IMPRESSION: Mild bibasilar subsegmental atelectasis. Electronically Signed   By: Marijo Conception M.D.   On: 01/09/2022 15:01   DG Abd Portable 1V  Result Date: 01/11/2022 CLINICAL DATA:  Nasogastric tube placement EXAM: PORTABLE ABDOMEN - 1 VIEW COMPARISON:  Two days ago FINDINGS: The enteric tube tip is at the proximal stomach with side port over the lower esophagus. Artifact from EKG leads. Extensive stool seen in the upper abdomen.  No dilated small bowel IMPRESSION: Enteric tube with tip at the upper stomach and side port at the lower esophagus. Diffuse colonic stool. Electronically Signed   By: Jorje Guild M.D.   On: 01/11/2022 07:23    Anti-infectives: Anti-infectives (From admission, onward)    Start     Dose/Rate Route Frequency Ordered Stop   01/10/22 1400  ceFAZolin (ANCEF) IVPB 2g/100 mL premix        2 g 200 mL/hr over 30 Minutes Intravenous  Once 01/10/22 1345 01/10/22 1445        Assessment/Plan Metastatic prostate cancer on chemo Concern for a colonic obstruction - CT scan questions internal hernia and a partial obstruction of the transverse colon, as well as large stool burden proximal to the point of obstruction. It also reports progressive disease since last PET scan despite being on chemo. Patient is a poor surgical candidate. Fortunately he does not have any abdominal complaints. Denies abdominal pain, nausea,  or vomiting. Would not recommend any surgical intervention. Ok for diet from our standpoint. Agree with aggressive bowel regimen - he is on BID miralax and dulcolax suppositories.   ID - none FEN -  reg diet VTE - SCDs/ per primary Foley - none  Mild moderate bilateral hydronephrosis in the setting of AKI - urology following COPD Atypical chest pain DNR/DNI  Moderate Medical Decision Making   LOS: 2 days    Nadeen Landau, MD [facility] Surgery, [place]

## 2022-01-11 NOTE — Progress Notes (Signed)
°  Subjective: Jose Wolfe has had an NG tube placed since I evaluated him yesterday.   Otherwise no acute complaints today  Objective: Vital signs in last 24 hours: Temp:  [97.6 F (36.4 C)-98.9 F (37.2 C)] 97.6 F (36.4 C) (01/15 1438) Pulse Rate:  [92-104] 92 (01/15 1438) Resp:  [14-21] 21 (01/15 1438) BP: (108-141)/(66-79) 141/74 (01/15 1438) SpO2:  [96 %-98 %] 97 % (01/15 1438) Weight:  [70.3 kg] 70.3 kg (01/15 0428)  Intake/Output from previous day: 01/14 0701 - 01/15 0700 In: -  Out: 1700 [Urine:1700] Intake/Output this shift: No intake/output data recorded.  Physical Exam:  General: Alert and oriented CV: RRR Lungs: Clear Abdomen: Soft, ND, ATTP Ext: NT, No erythema  Lab Results: Recent Labs    01/09/22 1540 01/10/22 0304 01/11/22 0244  HGB 8.2* 8.1* 8.3*  HCT 25.7* 25.5* 24.2*   BMET Recent Labs    01/10/22 0304 01/11/22 0244  NA 134* 131*  K 5.2* 4.6  CL 103 103  CO2 21* 20*  GLUCOSE 98 107*  BUN 27* 23  CREATININE 1.59* 1.63*  CALCIUM 8.3* 7.6*     Studies/Results: DG Abd Portable 1V  Result Date: 01/11/2022 CLINICAL DATA:  Nasogastric tube placement EXAM: PORTABLE ABDOMEN - 1 VIEW COMPARISON:  Two days ago FINDINGS: The enteric tube tip is at the proximal stomach with side port over the lower esophagus. Artifact from EKG leads. Extensive stool seen in the upper abdomen.  No dilated small bowel IMPRESSION: Enteric tube with tip at the upper stomach and side port at the lower esophagus. Diffuse colonic stool. Electronically Signed   By: Jorje Guild M.D.   On: 01/11/2022 07:23    Assessment/Plan: bilateral hydronephrosis in the setting of AKI suggesting obstructive uropathy 2.  History of advanced metastatic prostate cancer  I discussed with Jose Wolfe that his persistently elevated creatinine suggests an obstructive etiology is likely. Subsequently, IR is planning to place neph tubes +/- ureteral stent placement with tomorrow. I answered  all questions and Jose Wolfe voiced understanding    LOS: 2 days   Donald Pore MD 01/11/2022, 7:27 PM [facility]  Pager: [phone]

## 2022-01-11 NOTE — Evaluation (Signed)
Physical Therapy Evaluation Patient Details Name: Jose Wolfe MRN: [id number] DOB: [birth year] Today's Date: 01/11/2022  History of Present Illness  Jose Wolfe is a 76 y.o. male, who is admitted to [facility] on 01/09/2022 with obstructive uropathy and acute kidney injury after presenting from home to [facility] ED complaining of chest pain; also with large bowel obstruction;  with medical history significant for metastatic prostate cancer undergoing chemotherapy, COPD  Clinical Impression   Pt admitted with above diagnosis. Comes from home where pt lives with wife; independent, driving at baseline; Presents to PT with generalized weakness, and orthostatic hypotension; decr activity tolerance; Plans to place bil nephrostomy tubes on 1/16;  Pt currently with functional limitations due to the deficits listed below (see PT Problem List). Pt will benefit from skilled PT to increase their independence and safety with mobility to allow discharge to the venue listed below.    Hopeful that pt will progress well as he becomes more medically stable and progresses through hospital course; if slow progress, must consider SNF        Recommendations for follow up therapy are one component of a multi-disciplinary discharge planning process, led by the attending physician.  Recommendations may be updated based on patient status, additional functional criteria and insurance authorization.  Follow Up Recommendations Home health PT    Assistance Recommended at Discharge Intermittent Supervision/Assistance  Patient can return home with the following  A little help with walking and/or transfers;Help with stairs or ramp for entrance    Equipment Recommendations Rolling walker (2 wheels);BSC/3in1  Recommendations for Other Services       Functional Status Assessment Patient has had a recent decline in their functional status and demonstrates the ability to make significant improvements  in function in a reasonable and predictable amount of time.     Precautions / Restrictions Precautions Precautions: Fall Precaution Comments: monitor orthostatics until they prove to be stable      Mobility  Bed Mobility Overal bed mobility: Needs Assistance Bed Mobility: Supine to Sit;Sit to Supine     Supine to sit: Min assist Sit to supine: Min assist   General bed mobility comments: min handheld assist to pull to sit; min assist to help LEs into bed    Transfers Overall transfer level: Needs assistance Equipment used: 1 person hand held assist Transfers: Sit to/from Stand Sit to Stand: Mod assist           General transfer comment: light mod assist to power up    Ambulation/Gait                  Stairs            Wheelchair Mobility    Modified Rankin (Stroke Patients Only)       Balance Overall balance assessment: Needs assistance   Sitting balance-Leahy Scale: Fair       Standing balance-Leahy Scale: Poor                               Pertinent Vitals/Pain Pain Assessment: Faces Faces Pain Scale: Hurts even more Pain Location: lower abdomen Pain Descriptors / Indicators: Discomfort Pain Intervention(s): Monitored during session;Repositioned    Home Living Family/patient expects to be discharged to:: Private residence Living Arrangements: Spouse/significant other Available Help at Discharge: Family Type of Home: House Home Access: Stairs to enter (flight to enter from basement; not many to enter at front door)  Alternate Level Stairs-Number of Steps: 12 Home Layout: Two level;Able to live on main level with bedroom/bathroom Home Equipment: None      Prior Function Prior Level of Function : Independent/Modified Independent             Mobility Comments: Retired Ecologist; drives; 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 ADLs Comments: Independent     Hand Dominance        Extremity/Trunk  Assessment   Upper Extremity Assessment Upper Extremity Assessment: Overall WFL for tasks assessed    Lower Extremity Assessment Lower Extremity Assessment: Generalized weakness       Communication   Communication: No difficulties  Cognition Arousal/Alertness: Awake/alert Behavior During Therapy: WFL for tasks assessed/performed Overall Cognitive Status: Within Functional Limits for tasks assessed                                          General Comments General comments (skin integrity, edema, etc.):   01/11/22 1543  Vital Signs  Patient Position (if appropriate) Orthostatic Vitals  Orthostatic Lying   BP- Lying 137/74  Orthostatic Sitting  BP- Sitting 134/74  Orthostatic Standing at 0 minutes  BP- Standing at 0 minutes (!) 76/63       Exercises     Assessment/Plan    PT Assessment Patient needs continued PT services  PT Problem List Decreased strength;Decreased activity tolerance;Decreased balance;Decreased mobility;Decreased coordination;Decreased knowledge of use of DME;Decreased safety awareness;Decreased knowledge of precautions;Cardiopulmonary status limiting activity       PT Treatment Interventions DME instruction;Gait training;Stair training;Functional mobility training;Therapeutic activities;Therapeutic exercise;Balance training;Neuromuscular re-education;Patient/family education    PT Goals (Current goals can be found in the Care Plan section)  Acute Rehab PT Goals Patient Stated Goal: to feel better PT Goal Formulation: With patient Time For Goal Achievement: 01/25/22 Potential to Achieve Goals: Good    Frequency Min 3X/week     Co-evaluation               AM-PAC PT "6 Clicks" Mobility  Outcome Measure Help needed turning from your back to your side while in a flat bed without using bedrails?: A Little Help needed moving from lying on your back to sitting on the side of a flat bed without using bedrails?: A Little Help  needed moving to and from a bed to a chair (including a wheelchair)?: A Little Help needed standing up from a chair using your arms (e.g., wheelchair or bedside chair)?: A Lot Help needed to walk in hospital room?: A Lot Help needed climbing 3-5 steps with a railing? : A Lot 6 Click Score: 15    End of Session   Activity Tolerance: Treatment limited secondary to medical complications (Comment) (BP drop in standing) Patient left: in bed;with call bell/phone within reach Nurse Communication: Mobility status (and BP drop in standing) PT Visit Diagnosis: Unsteadiness on feet (R26.81);Other abnormalities of gait and mobility (R26.89)    Time: 5102-5852 PT Time Calculation (min) (ACUTE ONLY): 29 min   Charges:   PT Evaluation $PT Eval Moderate Complexity: 1 Mod PT Treatments $Therapeutic Activity: 8-22 mins        Roney Marion, PT  Acute Rehabilitation Services Pager [phone] Office [phone]   [place] 01/11/2022, 7:31 PM

## 2022-01-11 NOTE — Progress Notes (Signed)
Notified pt had small emesis drk brown-smelled like fecal matter- on call for [facility] notified

## 2022-01-12 ENCOUNTER — Inpatient Hospital Stay (HOSPITAL_COMMUNITY): Payer: Medicare HMO

## 2022-01-12 HISTORY — PX: IR NEPHROSTOMY PLACEMENT LEFT: IMG6063

## 2022-01-12 HISTORY — PX: IR NEPHROSTOMY PLACEMENT RIGHT: IMG6064

## 2022-01-12 LAB — BASIC METABOLIC PANEL WITH GFR
Anion gap: 12 (ref 5–15)
BUN: 26 mg/dL — ABNORMAL HIGH (ref 8–23)
CO2: 20 mmol/L — ABNORMAL LOW (ref 22–32)
Calcium: 7.6 mg/dL — ABNORMAL LOW (ref 8.9–10.3)
Chloride: 105 mmol/L (ref 98–111)
Creatinine, Ser: 1.5 mg/dL — ABNORMAL HIGH (ref 0.61–1.24)
GFR, Estimated: 48 mL/min — ABNORMAL LOW (ref >60)
Glucose, Bld: 107 mg/dL — ABNORMAL HIGH (ref 70–99)
Potassium: 4.3 mmol/L (ref 3.5–5.1)
Sodium: 137 mmol/L (ref 135–145)

## 2022-01-12 LAB — CBC
HCT: 25.1 % — ABNORMAL LOW (ref 39.0–52.0)
Hemoglobin: 8.5 g/dL — ABNORMAL LOW (ref 13.0–17.0)
MCH: 30.9 pg (ref 26.0–34.0)
MCHC: 33.9 g/dL (ref 30.0–36.0)
MCV: 91.3 fL (ref 80.0–100.0)
Platelets: 303 10*3/uL (ref 150–400)
RBC: 2.75 MIL/uL — ABNORMAL LOW (ref 4.22–5.81)
RDW: 20.2 % — ABNORMAL HIGH (ref 11.5–15.5)
WBC: 4.8 10*3/uL (ref 4.0–10.5)
nRBC: 0 % (ref 0.0–0.2)

## 2022-01-12 LAB — SURGICAL PCR SCREEN
MRSA, PCR: NEGATIVE
Staphylococcus aureus: NEGATIVE

## 2022-01-12 MED ORDER — FENTANYL CITRATE (PF) 100 MCG/2ML IJ SOLN
INTRAMUSCULAR | Status: AC
  Filled 2022-01-12: qty 2

## 2022-01-12 MED ORDER — MUPIROCIN 2 % EX OINT
1.0000 "application " | TOPICAL_OINTMENT | Freq: Two times a day (BID) | CUTANEOUS | Status: DC
  Administered 2022-01-12 – 2022-01-15 (×7): 1 via NASAL
  Filled 2022-01-12 (×2): qty 22

## 2022-01-12 MED ORDER — MIDAZOLAM HCL 2 MG/2ML IJ SOLN
INTRAMUSCULAR | Status: AC | PRN
Start: 2022-01-12 — End: 2022-01-12
  Administered 2022-01-12: 1 mg via INTRAVENOUS
  Administered 2022-01-12: .5 mg via INTRAVENOUS

## 2022-01-12 MED ORDER — IOHEXOL 300 MG/ML  SOLN
100.0000 mL | Freq: Once | INTRAMUSCULAR | Status: AC | PRN
  Administered 2022-01-12: 10 mL via INTRATHECAL

## 2022-01-12 MED ORDER — LIDOCAINE HCL 1 % IJ SOLN
INTRAMUSCULAR | Status: AC | PRN
  Administered 2022-01-12: 20 mL

## 2022-01-12 MED ORDER — MIDAZOLAM HCL 2 MG/2ML IJ SOLN
INTRAMUSCULAR | Status: AC
  Filled 2022-01-12: qty 2

## 2022-01-12 MED ORDER — FENTANYL CITRATE (PF) 100 MCG/2ML IJ SOLN
INTRAMUSCULAR | Status: AC | PRN
  Administered 2022-01-12: 50 ug via INTRAVENOUS
  Administered 2022-01-12: 25 ug via INTRAVENOUS

## 2022-01-12 MED ORDER — LIDOCAINE HCL 1 % IJ SOLN
INTRAMUSCULAR | Status: AC
  Filled 2022-01-12: qty 20

## 2022-01-12 MED ORDER — SORBITOL 70 % SOLN
960.0000 mL | TOPICAL_OIL | Freq: Once | ORAL | Status: AC
  Administered 2022-01-12: 960 mL via RECTAL
  Filled 2022-01-12: qty 473

## 2022-01-12 NOTE — Progress Notes (Signed)
Subjective/Chief Complaint: Pt with NGT last night Had BM this AM, watery . No abd complaints   Objective: Vital signs in last 24 hours: Temp:  [97.6 F (36.4 C)-98.4 F (36.9 C)] 98.4 F (36.9 C) (01/16 0435) Pulse Rate:  [92-100] 92 (01/16 0910) Resp:  [10-21] 15 (01/16 0910) BP: (102-145)/(67-81) 102/67 (01/16 0910) SpO2:  [95 %-99 %] 96 % (01/16 0910) Weight:  [40.7 kg] 40.7 kg (01/16 0500) Last BM Date: 01/11/22  Intake/Output from previous day: 01/15 0701 - 01/16 0700 In: 825 [I.V.:825] Out: 1100 [Emesis/NG output:1100] Intake/Output this shift: No intake/output data recorded.  PE:  Constitutional: No acute distress, conversant, appears states age. Eyes: Anicteric sclerae, moist conjunctiva, no lid lag Lungs: Clear to auscultation bilaterally, normal respiratory effort CV: regular rate and rhythm, no murmurs, no peripheral edema, pedal pulses 2+ GI: Soft, no masses or hepatosplenomegaly, non-tender to palpation Skin: No rashes, palpation reveals normal turgor Psychiatric: appropriate judgment and insight, oriented to person, place, and time   Lab Results:  Recent Labs    01/11/22 0244 01/12/22 0128  WBC 6.5 4.8  HGB 8.3* 8.5*  HCT 24.2* 25.1*  PLT 232 303   BMET Recent Labs    01/11/22 0244 01/12/22 0128  NA 131* 137  K 4.6 4.3  CL 103 105  CO2 20* 20*  GLUCOSE 107* 107*  BUN 23 26*  CREATININE 1.63* 1.50*  CALCIUM 7.6* 7.6*   PT/INR Recent Labs    01/10/22 0304  LABPROT 14.5  INR 1.1   ABG No results for input(s): PHART, HCO3 in the last 72 hours.  Invalid input(s): PCO2, PO2  Studies/Results: DG Abd Portable 1V  Result Date: 01/11/2022 CLINICAL DATA:  Nasogastric tube placement EXAM: PORTABLE ABDOMEN - 1 VIEW COMPARISON:  Two days ago FINDINGS: The enteric tube tip is at the proximal stomach with side port over the lower esophagus. Artifact from EKG leads. Extensive stool seen in the upper abdomen.  No dilated small bowel  IMPRESSION: Enteric tube with tip at the upper stomach and side port at the lower esophagus. Diffuse colonic stool. Electronically Signed   By: Jorje Guild M.D.   On: 01/11/2022 07:23    Anti-infectives: Anti-infectives (From admission, onward)    Start     Dose/Rate Route Frequency Ordered Stop   01/12/22 0600  cefTRIAXone (ROCEPHIN) 2 g in sodium chloride 0.9 % 100 mL IVPB        2 g 200 mL/hr over 30 Minutes Intravenous On call 01/11/22 1427 01/12/22 0845   01/10/22 1400  ceFAZolin (ANCEF) IVPB 2g/100 mL premix        2 g 200 mL/hr over 30 Minutes Intravenous  Once 01/10/22 1345 01/10/22 1445       Assessment/Plan: Metastatic prostate cancer on chemo Concern for a colonic obstruction - CT scan questions internal hernia and a partial obstruction of the transverse colon, as well as large stool burden proximal to the point of obstruction. It also reports progressive disease since last PET scan despite being on chemo. Patient is a poor surgical candidate. Fortunately he does not have any abdominal complaints. Denies abdominal pain, nausea, or vomiting. Would not recommend any surgical intervention. Ok for diet from our standpoint. Agree with aggressive bowel regimen - he is on BID miralax and dulcolax suppositories.    ID - none FEN - NGT in place.  Clamping x6h and if no n/v OK to DC and start liquids VTE - SCDs/ per primary Foley - none  Mild moderate bilateral hydronephrosis in the setting of AKI - urology following, s/p Perc nephro tubes COPD Atypical chest pain DNR/DNI  Rec palliative care consult based on poor prognosis   Moderate Medical Decision Making  LOS: 3 days    Ralene Ok 01/12/2022

## 2022-01-12 NOTE — Progress Notes (Signed)
PROGRESS NOTE    [name]  INO:[id number] DOB: 28-Mar-1946 DOA: 01/09/2022 PCP: [name], MD   Chief Complaint  Patient presents with   Chest Pain    Brief Narrative:   Jose Wolfe is a 76 y.o. male with medical history significant for metastatic prostate cancer undergoing chemotherapy, COPD, who is admitted to [facility] on 01/09/2022 with obstructive uropathy and acute kidney injury after presenting from home to [facility] ED complaining of chest pain.    Assessment & Plan:   Principal Problem:   Large bowel obstruction (HCC) Active Problems:   COPD (chronic obstructive pulmonary disease) (HCC)   Obstructive uropathy   Bilateral hydronephrosis   AKI (acute kidney injury) (HCC)   Hyperkalemia   Atypical chest pain   Normocytic anemia   Bilateral obstructive uropathy - CT imaging shows evidence of interval development of moderate bilateral hydronephrosis secondary to probable malignant infiltration of the distal right ureter and extrinsic compression of the distal left ureter secondary to perirectal soft tissue. -Management per urology, status post bilateral nephrostomy tubes today.  AKI -Due to obstructive uropathy, continue with IV fluids, avoid nephrotoxic medications.  Concern for colonic obstruction -General surgery input greatly appreciated, did develop some nausea and vomiting overnight, KUB confirmed large stool burden, for now he has NG tube, will keep on ILS. -We will continue with laxative regimen, she had couple good bowel movements overnight, but remains with significant stool burden, he is having few good bowel movements, will continue with suppositories and will give another smog enema today . -NGT has been clamped by general surgery, currently liquid diet and if he is tolerating can DC NGT in a.m.    Chest pain -Atypical, most likely due to to osseous metastatic disease, CTA chest negative for PE, EKG nonacute, troponins  negative x2.  Hyperkalemia -resolved with lokelma  Intraluminal mural mass within the bladder: -Urology on board  COPD  -No wheezing, continue with albuterol.  Metastatic prostate cancer -Currently on chemotherapy, he is due for [facility] today, discussed with Dr. Alen Blew, recommendation for Granix x7 days during hospital stay especially he might call procedure.     DVT prophylaxis: Heparin Code Status: DNR Family Communication: None at bedside Disposition:   Status is: Inpatient  Remains inpatient appropriate because: Renal failure, IV fluids       Consultants:  Urology General surgery   Subjective:  No further nausea or vomiting, he had large bowel movement with smog enema yesterday, and couple of small ones after that.  Objective: Vitals:   01/12/22 0850 01/12/22 0855 01/12/22 0910 01/12/22 1133  BP: 115/72 107/69 102/67 104/61  Pulse: 100 96 92   Resp: 16 16 15    Temp:    (!) 97.5 F (36.4 C)  TempSrc:    Oral  SpO2: 98% 99% 96%   Weight:      Height:        Intake/Output Summary (Last 24 hours) at 01/12/2022 1349 Last data filed at 01/12/2022 0708 Gross per 24 hour  Intake 825 ml  Output 1300 ml  Net -475 ml   Filed Weights   01/10/22 0714 01/11/22 0428 01/12/22 0500  Weight: 70.8 kg 70.3 kg 40.7 kg    Examination:  Awake Alert, Oriented X 3, No new F.N deficits, Normal affect Symmetrical Chest wall movement, Good air movement bilaterally, CTAB RRR,No Gallops,Rubs or new Murmurs, No Parasternal Heave +ve B.Sounds, Abd Soft, No tenderness, No rebound - guarding or rigidity. No Cyanosis, Clubbing or  edema, No new Rash or bruise       Data Reviewed: I have personally reviewed following labs and imaging studies  CBC: Recent Labs  Lab 01/07/22 0841 01/09/22 1540 01/10/22 0304 01/11/22 0244 01/12/22 0128  WBC 4.9 7.0 5.2 6.5 4.8  NEUTROABS 3.7 6.6 4.7  --   --   HGB 7.7* 8.2* 8.1* 8.3* 8.5*  HCT 22.7* 25.7* 25.5* 24.2* 25.1*  MCV 90.4  93.5 93.4 89.6 91.3  PLT 287 247 230 232 562    Basic Metabolic Panel: Recent Labs  Lab 01/07/22 0841 01/09/22 1540 01/10/22 0304 01/11/22 0244 01/12/22 0128  NA 136 134* 134* 131* 137  K 4.6 5.3* 5.2* 4.6 4.3  CL 104 104 103 103 105  CO2 24 20* 21* 20* 20*  GLUCOSE 96 94 98 107* 107*  BUN 29* 31* 27* 23 26*  CREATININE 1.87* 1.66* 1.59* 1.63* 1.50*  CALCIUM 8.8* 9.1 8.3* 7.6* 7.6*  MG  --   --  1.8   1.8  --   --   PHOS  --   --  4.7*  --   --     GFR: Estimated Creatinine Clearance: 23.7 mL/min (A) (by C-G formula based on SCr of 1.5 mg/dL (H)).  Liver Function Tests: Recent Labs  Lab 01/07/22 0841 01/09/22 1540 01/10/22 0304  AST 23 98* 128*  ALT 14 15 16   ALKPHOS 51 54 77  BILITOT 0.3 0.8 0.6  PROT 6.7 6.0* 5.8*  ALBUMIN 3.7 3.1* 2.9*    CBG: Recent Labs  Lab 01/11/22 2031  GLUCAP 105*     Recent Results (from the past 240 hour(s))  Resp Panel by RT-PCR (Flu A&B, Covid) Nasopharyngeal Swab     Status: None   Collection Time: 01/09/22  2:46 PM   Specimen: Nasopharyngeal Swab; Nasopharyngeal(NP) swabs in vial transport medium  Result Value Ref Range Status   SARS Coronavirus 2 by RT PCR NEGATIVE NEGATIVE Final    Comment: (NOTE) SARS-CoV-2 target nucleic acids are NOT DETECTED.  The SARS-CoV-2 RNA is generally detectable in upper respiratory specimens during the acute phase of infection. The lowest concentration of SARS-CoV-2 viral copies this assay can detect is 138 copies/mL. A negative result does not preclude SARS-Cov-2 infection and should not be used as the sole basis for treatment or other patient management decisions. A negative result may occur with  improper specimen collection/handling, submission of specimen other than nasopharyngeal swab, presence of viral mutation(s) within the areas targeted by this assay, and inadequate number of viral copies(<138 copies/mL). A negative result must be combined with clinical observations, patient  history, and epidemiological information. The expected result is Negative.  Fact Sheet for Patients:  EntrepreneurPulse.com.au  Fact Sheet for Healthcare Providers:  IncredibleEmployment.be  This test is no t yet approved or cleared by the [place] FDA and  has been authorized for detection and/or diagnosis of SARS-CoV-2 by FDA under an Emergency Use Authorization (EUA). This EUA will remain  in effect (meaning this test can be used) for the duration of the COVID-19 declaration under Section 564(b)(1) of the Act, 21 U.S.C.section 360bbb-3(b)(1), unless the authorization is terminated  or revoked sooner.       Influenza A by PCR NEGATIVE NEGATIVE Final   Influenza B by PCR NEGATIVE NEGATIVE Final    Comment: (NOTE) The Xpert Xpress SARS-CoV-2/FLU/RSV plus assay is intended as an aid in the diagnosis of influenza from Nasopharyngeal swab specimens and should not be used as a sole basis  for treatment. Nasal washings and aspirates are unacceptable for Xpert Xpress SARS-CoV-2/FLU/RSV testing.  Fact Sheet for Patients: EntrepreneurPulse.com.au  Fact Sheet for Healthcare Providers: IncredibleEmployment.be  This test is not yet approved or cleared by the [place] FDA and has been authorized for detection and/or diagnosis of SARS-CoV-2 by FDA under an Emergency Use Authorization (EUA). This EUA will remain in effect (meaning this test can be used) for the duration of the COVID-19 declaration under Section 564(b)(1) of the Act, 21 U.S.C. section 360bbb-3(b)(1), unless the authorization is terminated or revoked.  Performed at [facility], [place] [address], [place], [place] [address]   Surgical pcr screen     Status: None   Collection Time: 01/12/22  4:36 AM   Specimen: Nasal Mucosa; Nasal Swab  Result Value Ref Range Status   MRSA, PCR NEGATIVE NEGATIVE Final   Staphylococcus aureus NEGATIVE  NEGATIVE Final    Comment: (NOTE) The Xpert SA Assay (FDA approved for NASAL specimens in patients 72 years of age and older), is one component of a comprehensive surveillance program. It is not intended to diagnose infection nor to guide or monitor treatment. Performed at [facility], [place] [address] [place], [place] [address]          Radiology Studies: DG Abd Portable 1V  Result Date: 01/11/2022 CLINICAL DATA:  Nasogastric tube placement EXAM: PORTABLE ABDOMEN - 1 VIEW COMPARISON:  Two days ago FINDINGS: The enteric tube tip is at the proximal stomach with side port over the lower esophagus. Artifact from EKG leads. Extensive stool seen in the upper abdomen.  No dilated small bowel IMPRESSION: Enteric tube with tip at the upper stomach and side port at the lower esophagus. Diffuse colonic stool. Electronically Signed   By: Jorje Guild M.D.   On: 01/11/2022 07:23        Scheduled Meds:  bisacodyl  10 mg Rectal BID   docusate sodium  100 mg Oral BID   heparin injection (subcutaneous)  5,000 Units Subcutaneous Q8H   mometasone-formoterol  2 puff Inhalation BID   mupirocin ointment  1 application Nasal BID   polyethylene glycol  17 g Oral BID   sorbitol, milk of mag, mineral oil, glycerin (SMOG) enema  960 mL Rectal Once   Tbo-filgastrim (GRANIX) SQ  480 mcg Subcutaneous q1800   Continuous Infusions:  sodium chloride 75 mL/hr at 01/12/22 0505   lactated ringers 10 mL/hr at 01/10/22 1415     LOS: 3 days       Phillips Climes, MD [facility]   To contact the attending provider between 7A-7P or the covering provider during after hours 7P-7A, please log into the web site [email or web address] and access using universal [place] password for that web site. If you do not have the password, please call the hospital operator.  01/12/2022, 1:49 PM   Patient ID: Tyrail Grandfield, male   DOB: 1946-03-18, 41 y.o.   MRN: [id number] Patient ID: Barnell Shieh, male   DOB: 12-25-1946, 76 y.o.   MRN: [id number] Patient ID: Esiah Bazinet, male   DOB: [birth year], 76 y.o.   MRN: [id number]

## 2022-01-12 NOTE — Plan of Care (Signed)

## 2022-01-12 NOTE — Procedures (Signed)
Interventional Radiology Procedure Note  Procedure: bilateral 10 fr pcns    Complications: None  Estimated Blood Loss:  min  Findings: Full report in pacs     Tamera Punt, MD

## 2022-01-12 NOTE — Progress Notes (Signed)
Physical Therapy Treatment Patient Details Name: Jose Wolfe MRN: [id number] DOB: 1946/01/27 Today's Date: 01/12/2022   History of Present Illness Jose Wolfe is a 76 y.o. male, who is admitted to [name] on 01/09/2022 with obstructive uropathy and acute kidney injury after presenting from home to [name] ED complaining of chest pain; also with large bowel obstruction; bil nephrostomy tubes 1/16;  with medical history significant for metastatic prostate cancer undergoing chemotherapy, COPD    PT Comments    Continuing work on functional mobility and activity tolerance;  Session focused on assessing Orthostatic BPs while assisting pt OOB to recliner; Standing today did present another BP decr, but pt able to stay in standing for a 3 minute standing BP, which did not drop further; Performed marching in place while standing with light mod assist to steady; Plan for gait next session, will start with bil UE support using a RW; Pt seemed pleased to be able to get up and OOB  Recommendations for follow up therapy are one component of a multi-disciplinary discharge planning process, led by the attending physician.  Recommendations may be updated based on patient status, additional functional criteria and insurance authorization.  Follow Up Recommendations  Home health PT     Assistance Recommended at Discharge Intermittent Supervision/Assistance  Patient can return home with the following A little help with walking and/or transfers;Help with stairs or ramp for entrance;Other (comment) (hopeful for progress)   Equipment Recommendations  Rolling walker (2 wheels);BSC/3in1    Recommendations for Other Services       Precautions / Restrictions Precautions Precautions: Fall Precaution Comments: monitor orthostatics until they prove to be stable     Mobility  Bed Mobility Overal bed mobility: Needs Assistance Bed Mobility: Supine to Sit     Supine to sit: Min  assist     General bed mobility comments: min handheld assist to pull to sit    Transfers Overall transfer level: Needs assistance Equipment used: 1 person hand held assist Transfers: Sit to/from Stand;Bed to chair/wheelchair/BSC Sit to Stand: Min assist     Step pivot transfers: Min assist     General transfer comment: Min assist to steady; cues to self-monitor for activity tolerance    Ambulation/Gait Ambulation/Gait assistance: Mod assist Gait Distance (Feet):  (march in place, 2 bouts of 45-60 seconds) Assistive device: 1 person hand held assist         General Gait Details: Marched in place in front of recliner, light mod assist to steady   Stairs             Wheelchair Mobility    Modified Rankin (Stroke Patients Only)       Balance     Sitting balance-Leahy Scale: Fair       Standing balance-Leahy Scale: Poor (approaching Fair)                              Cognition Arousal/Alertness: Awake/alert Behavior During Therapy: WFL for tasks assessed/performed Overall Cognitive Status: Within Functional Limits for tasks assessed                                          Exercises      General Comments General comments (skin integrity, edema, etc.):   01/12/22 1500  Orthostatic Lying   BP- Lying 117/77  Pulse-  Lying 80  Orthostatic Sitting  BP- Sitting 107/60  Pulse- Sitting 90  Orthostatic Standing at 0 minutes  BP- Standing at 0 minutes (!) 88/52  Pulse- Standing at 0 minutes 104 (Not symptomatic for dizziness)  Orthostatic Standing at 3 minutes  BP- Standing at 3 minutes (!) 88/57 (after marching in place; minor dizziness)  Pulse- Standing at 3 minutes 106         Pertinent Vitals/Pain Pain Assessment: Faces Faces Pain Scale: Hurts a little bit Pain Location: lower abdomen; and R nephrostomy site Pain Descriptors / Indicators: Discomfort Pain Intervention(s): Monitored during session    Home  Living                          Prior Function            PT Goals (current goals can now be found in the care plan section) Acute Rehab PT Goals Patient Stated Goal: to feel better PT Goal Formulation: With patient Time For Goal Achievement: 01/25/22 Potential to Achieve Goals: Good Progress towards PT goals: Progressing toward goals    Frequency    Min 3X/week      PT Plan Current plan remains appropriate    Co-evaluation              AM-PAC PT "6 Clicks" Mobility   Outcome Measure  Help needed turning from your back to your side while in a flat bed without using bedrails?: None Help needed moving from lying on your back to sitting on the side of a flat bed without using bedrails?: A Little Help needed moving to and from a bed to a chair (including a wheelchair)?: A Little Help needed standing up from a chair using your arms (e.g., wheelchair or bedside chair)?: A Little Help needed to walk in hospital room?: A Little Help needed climbing 3-5 steps with a railing? : A Lot 6 Click Score: 18    End of Session   Activity Tolerance: Patient tolerated treatment well Patient left: in chair;with call bell/phone within reach Nurse Communication: Mobility status PT Visit Diagnosis: Unsteadiness on feet (R26.81);Other abnormalities of gait and mobility (R26.89)     Time: 7619-5093 PT Time Calculation (min) (ACUTE ONLY): 33 min  Charges:  $Gait Training: 8-22 mins $Therapeutic Activity: 8-22 mins                     Roney [place], PT  Acute Rehabilitation Services Pager [phone] Office [place] 01/12/2022, 4:01 PM

## 2022-01-12 NOTE — TOC Initial Note (Signed)
Transition of Care [facility]) - Initial/Assessment Note    Patient Details  Name: Jose Wolfe MRN: [id number] Date of Birth: 13-Dec-1946  Transition of Care [facility]) CM/SW Contact:    Bartholomew Crews, RN Phone Number: [phone] 01/12/2022, 2:05 PM  Clinical Narrative:                  Spoke with patient at the bedside to discuss post acute transition. PTA home with wife. Independent. Has a cane for occasional use.   Agreeable to [facility] RN, PT - patient will need [facility] RN PT orders with Face to Face. Offered agency choice - referral accepted by [facility].   Agreeable to DME RW, 3N1 - Patient will need DME orders for rolling walker and 3n1. Referral to [employer] to deliver DME to room on day of discharge.   Spouse to provide transportation home in private vehicle.   TOC following for transition needs.   Expected Discharge Plan: [place] Barriers to Discharge: Continued Medical Work up   Patient Goals and CMS Choice Patient states their goals for this hospitalization and ongoing recovery are:: return home with wife CMS Medicare.gov Compare Post Acute Care list provided to:: Patient Choice offered to / list presented to : Patient  Expected Discharge Plan and Services Expected Discharge Plan: [place] In-house Referral: NA Discharge Planning Services: CM Consult Post Acute Care Choice: Home Health, Durable Medical Equipment Living arrangements for the past 2 months: [place]                 DME Arranged: 3-N-1, Walker rolling DME Agency: [employer] Date DME Agency Contacted: 01/12/22 Time DME Agency Contacted: [phone] Representative spoke with at DME Agency: Freda Munro HH Arranged: PT, RN [place] Agency: [place] Date [place]: 01/12/22 Time [place]: 84 Representative spoke with at [place]: [place] Arrangements/Services Living arrangements for the past 2 months: [place] with:: Self, Spouse Patient language and need for interpreter reviewed:: Yes        Need for Family Participation in Patient Care: Yes (Comment) Care giver support system in place?: Yes (comment) Current home services: DME (cane) Criminal Activity/Legal Involvement Pertinent to Current Situation/Hospitalization: No - Comment as needed  Activities of Daily Living      Permission Sought/Granted                  Emotional Assessment Appearance:: Appears stated age Attitude/Demeanor/Rapport: Engaged Affect (typically observed): Accepting Orientation: : Oriented to Self, Oriented to Place, Oriented to  Time, Oriented to Situation Alcohol / Substance Use: Not Applicable Psych Involvement: No (comment)  Admission diagnosis:  No admission diagnoses are documented for this encounter. Patient Active Problem List   Diagnosis Date Noted   Large bowel obstruction ([place]) 01/09/2022   COPD (chronic obstructive pulmonary disease) (HCC)    Obstructive uropathy    Bilateral hydronephrosis    AKI (acute kidney injury) ([place])    Hyperkalemia    Atypical chest pain    Normocytic anemia    Port-A-Cath in place 10/17/2021   Metastatic castration-resistant adenocarcinoma of prostate ([place]) 10/18/2019   PCP:  [name], MD Pharmacy:   [id number] - [place], [place] - [address] [place] [address] Phone: [phone] Fax: [phone]     Social Determinants of Health (SDOH) Interventions    Readmission Risk Interventions No flowsheet data found.

## 2022-01-12 NOTE — Progress Notes (Signed)
PT Cancellation Note  Patient Details Name: Jose Wolfe MRN: [id number] DOB: [birth year]   Cancelled Treatment:    Reason Eval/Treat Not Completed: Other (comment)  Just back from procedure (bil nephrostomy tubes placed by IR);  Briefly discussed nephrostomy bag management;   Decided to wait until the afternoon or later this am to get up, and plan to check orthostatics as well;  Will make every effort to follow up later today as time allows;  Otherwise, will follow up for PT tomorrow;   Thank you,  Roney [place], PT  Acute Rehabilitation Services Pager [phone] Office [phone]    [place] 01/12/2022, 9:55 AM

## 2022-01-13 ENCOUNTER — Inpatient Hospital Stay (HOSPITAL_COMMUNITY): Payer: Medicare HMO

## 2022-01-13 LAB — BASIC METABOLIC PANEL
Anion gap: 7 (ref 5–15)
BUN: 16 mg/dL (ref 8–23)
CO2: 23 mmol/L (ref 22–32)
Calcium: 7.3 mg/dL — ABNORMAL LOW (ref 8.9–10.3)
Chloride: 107 mmol/L (ref 98–111)
Creatinine, Ser: 0.91 mg/dL (ref 0.61–1.24)
GFR, Estimated: >60 mL/min (ref >60)
Glucose, Bld: 114 mg/dL — ABNORMAL HIGH (ref 70–99)
Potassium: 3.4 mmol/L — ABNORMAL LOW (ref 3.5–5.1)
Sodium: 137 mmol/L (ref 135–145)

## 2022-01-13 LAB — CBC
HCT: 23.1 % — ABNORMAL LOW (ref 39.0–52.0)
Hemoglobin: 7.4 g/dL — ABNORMAL LOW (ref 13.0–17.0)
MCH: 30 pg (ref 26.0–34.0)
MCHC: 32 g/dL (ref 30.0–36.0)
MCV: 93.5 fL (ref 80.0–100.0)
Platelets: 306 10*3/uL (ref 150–400)
RBC: 2.47 MIL/uL — ABNORMAL LOW (ref 4.22–5.81)
RDW: 20.2 % — ABNORMAL HIGH (ref 11.5–15.5)
WBC: 3.2 10*3/uL — ABNORMAL LOW (ref 4.0–10.5)
nRBC: 0 % (ref 0.0–0.2)

## 2022-01-13 LAB — PATHOLOGIST SMEAR REVIEW

## 2022-01-13 MED ORDER — POTASSIUM CHLORIDE CRYS ER 20 MEQ PO TBCR
40.0000 meq | EXTENDED_RELEASE_TABLET | Freq: Once | ORAL | Status: AC
  Administered 2022-01-13: 40 meq via ORAL
  Filled 2022-01-13: qty 2

## 2022-01-13 MED ORDER — POLYETHYLENE GLYCOL 3350 17 G PO PACK
34.0000 g | PACK | Freq: Once | ORAL | Status: AC
  Administered 2022-01-13: 34 g via ORAL
  Filled 2022-01-13: qty 2

## 2022-01-13 NOTE — Progress Notes (Signed)
Referring Physician(s): Dr. Daryll Brod  Supervising Physician: Markus Daft  Patient Status:  [facility] - In-pt  Chief Complaint:  S/p bilateral percutaneous nephrostomy tube placement 01/12/22  Subjective:  Pt denies pain, N/V, fever or chills. He has no other c/o .  Allergies: Atorvastatin  Medications: Prior to Admission medications   Medication Sig Start Date End Date Taking? Authorizing Provider  B Complex Vitamins (VITAMIN B COMPLEX) TABS Take 1 tablet by mouth daily.   Yes [provider]  betamethasone dipropionate (DIPROLENE) 0.05 % cream Apply 1 application topically 2 (two) times daily as needed (for psoriasis).  10/28/18  Yes [provider]  budesonide-formoterol (SYMBICORT) 160-4.5 MCG/ACT inhaler Inhale 2 puffs into the lungs 2 (two) times daily.    Yes [provider]  calcium-vitamin D (OSCAL WITH D) 500-200 MG-UNIT tablet Take 1 tablet by mouth.   Yes [provider]  Cyanocobalamin (VITAMIN B 12 PO) Take 1 tablet by mouth daily.   Yes [provider]  guaiFENesin (MUCINEX) 600 MG 12 hr tablet Take 600 mg by mouth 2 (two) times daily as needed for cough.   Yes [provider]  lidocaine-prilocaine (EMLA) cream Apply 1 application topically as needed. Patient taking differently: Apply 1 application topically as needed (access port). 09/23/21  Yes Wyatt Portela, MD  megestrol (MEGACE ES) 625 MG/5ML suspension Take 5 mLs (625 mg total) by mouth daily. 01/02/22  Yes Wyatt Portela, MD  prochlorperazine (COMPAZINE) 10 MG tablet Take 1 tablet (10 mg total) by mouth every 6 (six) hours as needed for nausea or vomiting. 11/05/21  Yes Wyatt Portela, MD  pseudoephedrine-acetaminophen (TYLENOL SINUS) 30-500 MG TABS tablet Take 1 tablet by mouth every 4 (four) hours as needed (sinus congestion).   Yes [provider]  senna-docusate (SENNA S) 8.6-50 MG tablet Take 1 tablet by mouth at bedtime as needed for mild  constipation. 01/07/22  Yes Wyatt Portela, MD  triamcinolone cream (KENALOG) 0.1 % Apply 1 application topically 2 (two) times daily as needed (for psoriasis). 10/28/18  Yes [provider]  VENTOLIN HFA 108 (90 Base) MCG/ACT inhaler INHALE 1 PUFF BY MOUTH EVERY 4 HOURS AS NEEDED 09/10/21  Yes Croitoru, Mihai, MD  leuprolide, 6 Month, (LEUPROLIDE ACETATE, 6 MONTH,) 45 MG injection See admin instructions.    [provider]  metoprolol tartrate (LOPRESSOR) 50 MG tablet Take one tablet two hours prior to the test 10/31/21   Croitoru, Mihai, MD  predniSONE (DELTASONE) 5 MG tablet Take 5 mg by mouth daily. 06/02/21   [provider]     Vital Signs: BP 130/71 (BP Location: Right Arm)    Pulse 80    Temp 98 F (36.7 C) (Oral)    Resp 14    Ht 6' (1.829 m)    Wt 89 lb 11.6 oz (40.7 kg)    SpO2 98%    BMI 12.17 kg/m   Physical Exam Vitals reviewed.  Constitutional:      Appearance: Normal appearance. He is not ill-appearing.  HENT:     Head: Normocephalic and atraumatic.  Cardiovascular:     Rate and Rhythm: Normal rate.  Pulmonary:     Effort: Pulmonary effort is normal. No respiratory distress.  Abdominal:     Comments: Bilateral posterior PCNs in place. Both sites unremarkable with sutures/statlock in place. No redness, bleeding or drainage noted. Both dressings damp from leaking gravity bag.  R PCN- 300 cc clear, yellow urine L PCN-  400 cc clear, yellow urine  Genitourinary:    Comments: Condom cath in place per pt with minimal clear, yellow urine in bag.  Skin:    General: Skin is warm and dry.  Neurological:     Mental Status: He is alert and oriented to person, place, and time.  Psychiatric:        Mood and Affect: Mood normal.        Behavior: Behavior normal.        Thought Content: Thought content normal.        Judgment: Judgment normal.    Imaging: CT ABDOMEN PELVIS WO CONTRAST  Result Date: 01/09/2022 CLINICAL DATA:  Abdominal pain, acute,  nonlocalized evaluate for obstruction. bilateral hydronephrosis on CTA. EXAM: CT ABDOMEN AND PELVIS WITHOUT CONTRAST TECHNIQUE: Multidetector CT imaging of the abdomen and pelvis was performed following the standard protocol without IV contrast. RADIATION DOSE REDUCTION: This exam was performed according to the departmental dose-optimization program which includes automated exposure control, adjustment of the mA and/or kV according to patient size and/or use of iterative reconstruction technique. COMPARISON:  10/04/2019 FINDINGS: Lower chest: No acute abnormality. Moderate coronary artery calcification. Small hiatal hernia. Hepatobiliary: No focal liver abnormality is seen. No gallstones, gallbladder wall thickening, or biliary dilatation. Pancreas: Unremarkable Spleen: Unremarkable Adrenals/Urinary Tract: Dense calcification within the right adrenal gland is in keeping with prior infarction and/or hemorrhage. Left adrenal gland is unremarkable. The kidneys are normal in size and position. There is moderate bilateral hydronephrosis, new from prior PET CT examination of 09/19/2021. Left ureteral distension extends to the level of the a lobulated mass within the left hemipelvis, best seen on image # 60/4. Right ureteral obstruction extends to the level of the distal right ureter just proximal to the right ureterovesicular junction. A discrete obstructing mass is not clearly identified in this location. There is, however, asymmetric bladder wall thickening in the region of the right ureterovesicular junction and circumferential thickening of the distal right ureter which may reflect malignant infiltration in this location, best seen on coronal image # 57-58/7. Additionally, there is a lobulated mural mass within the dome of the bladder measuring roughly 3.9 x 2.8 x 6.3 cm in greatest dimension, best seen on axial image # 70/4. Stomach/Bowel: The transverse colon is redundant. There is an internal hernia present with  multiple loops of small bowel extending above the mid transverse colon into the right anterior abdomen. This results in a partial obstruction of the transverse colon with large volume stool proximal to the point of obstruction secondary to mass effect. This is best seen on axial image # 39/4 and coronal image # 23-27. The stomach and small bowel are unremarkable. The appendix is not clearly identified and may be absent. No free intraperitoneal gas or fluid. Vascular/Lymphatic: As noted above, there is a soft tissue mass within the left perirectal soft tissues measuring 2.7 x 6.1 cm on axial image # 65/4 possibly representing a nodal conglomerate given the findings within the bladder. There is, additionally, pathologic right retrocrural, left periaortic, right pericaval, left pelvic sidewall and right external iliac lymphadenopathy with the index lymph node measuring 3.2 x 3.5 cm at axial image # 69/4. Moderate aortoiliac atherosclerotic calcification. No aortic aneurysm. Reproductive: Brachytherapy seeds are seen within the prostate gland. Other: No abdominal wall hernia. Musculoskeletal: Widespread sclerotic metastases are seen within the visualized axial skeleton, significantly progressed since prior examination. No pathologic fracture identified. IMPRESSION: Interval development of a moderate bilateral hydronephrosis secondary to probable malignant infiltration  of the distal right ureter and extrinsic compression of the distal left ureter secondary to perirectal soft tissue possibly representing a metastatic nodal conglomerate. Interval development of asymmetric wall thickening with a superimposed intraluminal mural mass within the bladder suspicious for a primary bladder malignancy. This would be better assessed with cystoscopy. Less likely, this may represent a metastatic mass related to the patient's underlying prostate malignancy. Widespread retroperitoneal metastatic adenopathy, progressive since prior PET  CT examination. This can be seen both in the setting of metastatic bladder and metastatic prostate cancer, however, its presence on prior PET CT examination suggests that this may reflect progressive disease related to the patient's metastatic prostate cancer. Right external iliac pathologic lymphadenopathy would be amenable to percutaneous ultrasound-guided biopsy if clinically indicated. Widespread osteoblastic metastases, significantly progressed since prior examination, most in keeping with progressive metastatic prostate cancer. Aortic Atherosclerosis (ICD10-I70.0). Electronically Signed   By: Fidela Salisbury M.D.   On: 01/09/2022 17:02   CT Head Wo Contrast  Result Date: 01/09/2022 CLINICAL DATA:  Headache, prostate cancer currently undergoing chemotherapy, began new medication with generalized feeling of unwellness, episode of unresponsiveness and right gaze, apnea EXAM: CT HEAD WITHOUT CONTRAST TECHNIQUE: Contiguous axial images were obtained from the base of the skull through the vertex without intravenous contrast. RADIATION DOSE REDUCTION: This exam was performed according to the departmental dose-optimization program which includes automated exposure control, adjustment of the mA and/or kV according to patient size and/or use of iterative reconstruction technique. COMPARISON:  None. FINDINGS: Brain: No acute infarct or hemorrhage. Lateral ventricles and midline structures are unremarkable. No acute extra-axial fluid collections. No mass effect. Vascular: No hyperdense vessel or unexpected calcification. Skull: Normal. Negative for fracture or focal lesion. Sinuses/Orbits: Partial opacification of the ethmoid air cells. Remaining paranasal sinuses are clear. Other: None. IMPRESSION: 1. No acute intracranial process. Electronically Signed   By: Randa Ngo M.D.   On: 01/09/2022 15:41   CT Angio Chest PE W and/or Wo Contrast  Result Date: 01/09/2022 CLINICAL DATA:  Shortness of breath, chest pain.  History of metastatic prostate cancer. EXAM: CT ANGIOGRAPHY CHEST WITH CONTRAST TECHNIQUE: Multidetector CT imaging of the chest was performed using the standard protocol during bolus administration of intravenous contrast. Multiplanar CT image reconstructions and MIPs were obtained to evaluate the vascular anatomy. RADIATION DOSE REDUCTION: This exam was performed according to the departmental dose-optimization program which includes automated exposure control, adjustment of the mA and/or kV according to patient size and/or use of iterative reconstruction technique. CONTRAST:  60mL OMNIPAQUE IOHEXOL 350 MG/ML SOLN COMPARISON:  July 29, 2006. FINDINGS: Cardiovascular: Satisfactory opacification of the pulmonary arteries to the segmental level. No evidence of pulmonary embolism. Normal heart size. No pericardial effusion. Atherosclerosis of thoracic aorta is noted. Mediastinum/Nodes: Small sliding-type hiatal hernia. No adenopathy is noted. Thyroid gland is unremarkable. Lungs/Pleura: Lungs are clear. No pleural effusion or pneumothorax. Upper Abdomen: There appears to be mild bilateral hydronephrosis. Musculoskeletal: Sclerotic densities are noted throughout the spine and ribs consistent with osseous metastatic disease. Review of the MIP images confirms the above findings. IMPRESSION: No definite evidence of pulmonary embolus. Continued presence of diffuse osseous metastases. Probable bilateral hydronephrosis is seen in the visualized portion of the upper abdomen. Small sliding-type hiatal hernia. Aortic Atherosclerosis (ICD10-I70.0). Electronically Signed   By: Marijo Conception M.D.   On: 01/09/2022 15:42   DG Chest Portable 1 View  Result Date: 01/09/2022 CLINICAL DATA:  Chest pain. EXAM: PORTABLE CHEST 1 VIEW COMPARISON:  December 23, 2018.  FINDINGS: The heart size and mediastinal contours are within normal limits. Right internal jugular Port-A-Cath is noted with distal tip in expected position of the SVC.  Mild bibasilar subsegmental atelectasis is noted. The visualized skeletal structures are unremarkable. IMPRESSION: Mild bibasilar subsegmental atelectasis. Electronically Signed   By: Marijo Conception M.D.   On: 01/09/2022 15:01   DG Abd Portable 1V  Result Date: 01/13/2022 CLINICAL DATA:  Follow-up small bowel obstruction. EXAM: PORTABLE ABDOMEN - 1 VIEW COMPARISON:  KUB 01/11/2022 CT abdomen and pelvis 01/09/2022 FINDINGS: An enteric tube is seen with tip overlying the expected region of the stomach and left upper quadrant. The side port overlies the expected region of the distal esophagus/gastroesophageal junction. There are bilateral renal pigtail catheters, likely bilateral external nephrostomy tubes. Numerous metallic densities overlie the prostate. There are vascular phleboliths overlying the pelvis. Stool is seen throughout the proximal to mid colon. There appears to be air within nondistended descending colon. Note is made that an internal hernia of small bowel extending into the right upper quadrant was seen contributing to partial obstruction of the transverse colon on prior CT. There are some small bowel loops measuring up to 3.5 cm within the right upper quadrant compatible with obstruction extending retrograde to the small bowel. No portal venous gas or pneumatosis is seen. The lung bases are clear. No acute skeletal abnormality. IMPRESSION:: IMPRESSION: 1. There is again high-grade stool throughout the proximal colon. Note is made on prior CT of partial obstruction of the transverse colon associated with an internal hernia of small bowel. There now is mild dilatation of a loop of small bowel within the right upper quadrant, likely related to this partial bowel obstruction. 2. Tip of enteric tube overlies the stomach. Electronically Signed   By: Yvonne Kendall M.D.   On: 01/13/2022 08:58   DG Abd Portable 1V  Result Date: 01/11/2022 CLINICAL DATA:  Nasogastric tube placement EXAM: PORTABLE  ABDOMEN - 1 VIEW COMPARISON:  Two days ago FINDINGS: The enteric tube tip is at the proximal stomach with side port over the lower esophagus. Artifact from EKG leads. Extensive stool seen in the upper abdomen.  No dilated small bowel IMPRESSION: Enteric tube with tip at the upper stomach and side port at the lower esophagus. Diffuse colonic stool. Electronically Signed   By: Jorje Guild M.D.   On: 01/11/2022 07:23   IR NEPHROSTOMY PLACEMENT LEFT  Result Date: 01/12/2022 INDICATION: Bilateral hydronephrosis EXAM: ULTRASOUND AND FLUOROSCOPIC 10 FRENCH BILATERAL NEPHROSTOMIES COMPARISON:  01/09/2022 MEDICATIONS: 1% lidocaine local ANESTHESIA/SEDATION: Moderate (conscious) sedation was employed during this procedure. A total of Versed 1.5 mg and Fentanyl 70 mcg was administered intravenously by the radiology nurse. Total intra-service moderate Sedation Time: 19 minutes. The patient's level of consciousness and vital signs were monitored continuously by radiology nursing throughout the procedure under my direct supervision. CONTRAST:  10 cc-administered into the collecting system(s) FLUOROSCOPY TIME:  Fluoroscopy Time: 3 minutes 18 seconds (8 mGy). COMPLICATIONS: None immediate. PROCEDURE: Informed written consent was obtained from the patient after a thorough discussion of the procedural risks, benefits and alternatives. All questions were addressed. Maximal Sterile Barrier Technique was utilized including caps, mask, sterile gowns, sterile gloves, sterile drape, hand hygiene and skin antiseptic. A timeout was performed prior to the initiation of the procedure. Left nephrostomy: Under sterile conditions and local anesthesia, percutaneous needle access performed of the dilated left kidney mid pole calyx. Needle position confirmed with ultrasound. Images obtained for documentation. Guidewire inserted followed by tract  dilatation to insert a 10 [place] drain. Retention loop formed the renal pelvis. Images obtained  for documentation. Contrast injection confirms position. Catheter secured with Prolene suture and connected to external gravity drainage. Sterile dressing applied. Right nephrostomy: Under sterile conditions and local anesthesia, percutaneous needle access also performed of a dilated right kidney lower pole calyx. Needle position confirmed with ultrasound. Images obtained for documentation. There was return of clear urine. Guidewire inserted followed by tract dilatation to insert a 10 [place] drain. Retention loop formed the renal pelvis. Catheter secured with Prolene suture and connected to external gravity drainage. Sterile dressing applied. No immediate complication. IMPRESSION: Successful bilateral 10 [place] nephrostomies Electronically Signed   By: Jerilynn Mages.  Shick M.D.   On: 01/12/2022 14:48   IR NEPHROSTOMY PLACEMENT RIGHT  Result Date: 01/12/2022 INDICATION: Bilateral hydronephrosis EXAM: ULTRASOUND AND FLUOROSCOPIC 10 FRENCH BILATERAL NEPHROSTOMIES COMPARISON:  01/09/2022 MEDICATIONS: 1% lidocaine local ANESTHESIA/SEDATION: Moderate (conscious) sedation was employed during this procedure. A total of Versed 1.5 mg and Fentanyl 70 mcg was administered intravenously by the radiology nurse. Total intra-service moderate Sedation Time: 19 minutes. The patient's level of consciousness and vital signs were monitored continuously by radiology nursing throughout the procedure under my direct supervision. CONTRAST:  10 cc-administered into the collecting system(s) FLUOROSCOPY TIME:  Fluoroscopy Time: 3 minutes 18 seconds (8 mGy). COMPLICATIONS: None immediate. PROCEDURE: Informed written consent was obtained from the patient after a thorough discussion of the procedural risks, benefits and alternatives. All questions were addressed. Maximal Sterile Barrier Technique was utilized including caps, mask, sterile gowns, sterile gloves, sterile drape, hand hygiene and skin antiseptic. A timeout was performed prior to the  initiation of the procedure. Left nephrostomy: Under sterile conditions and local anesthesia, percutaneous needle access performed of the dilated left kidney mid pole calyx. Needle position confirmed with ultrasound. Images obtained for documentation. Guidewire inserted followed by tract dilatation to insert a 10 [place] drain. Retention loop formed the renal pelvis. Images obtained for documentation. Contrast injection confirms position. Catheter secured with Prolene suture and connected to external gravity drainage. Sterile dressing applied. Right nephrostomy: Under sterile conditions and local anesthesia, percutaneous needle access also performed of a dilated right kidney lower pole calyx. Needle position confirmed with ultrasound. Images obtained for documentation. There was return of clear urine. Guidewire inserted followed by tract dilatation to insert a 10 [place] drain. Retention loop formed the renal pelvis. Catheter secured with Prolene suture and connected to external gravity drainage. Sterile dressing applied. No immediate complication. IMPRESSION: Successful bilateral 10 [place] nephrostomies Electronically Signed   By: Jerilynn Mages.  Shick M.D.   On: 01/12/2022 14:48    Labs:  CBC: Recent Labs    01/10/22 0304 01/11/22 0244 01/12/22 0128 01/13/22 0353  WBC 5.2 6.5 4.8 3.2*  HGB 8.1* 8.3* 8.5* 7.4*  HCT 25.5* 24.2* 25.1* 23.1*  PLT 230 232 303 306    COAGS: Recent Labs    01/10/22 0304  INR 1.1    BMP: Recent Labs    01/10/22 0304 01/11/22 0244 01/12/22 0128 01/13/22 0353  NA 134* 131* 137 137  K 5.2* 4.6 4.3 3.4*  CL 103 103 105 107  CO2 21* 20* 20* 23  GLUCOSE 98 107* 107* 114*  BUN 27* 23 26* 16  CALCIUM 8.3* 7.6* 7.6* 7.3*  CREATININE 1.59* 1.63* 1.50* 0.91  GFRNONAA 44* 43* 48* >60    LIVER FUNCTION TESTS: Recent Labs    12/17/21 0936 01/07/22 0841 01/09/22 1540 01/10/22 0304  BILITOT 0.4 0.3 0.8 0.6  AST 28 23 98* 128*  ALT 19 14 15 16   ALKPHOS 63 51 54 77   PROT 6.8 6.7 6.0* 5.8*  ALBUMIN 3.9 3.7 3.1* 2.9*    Assessment and Plan:  S/p bilateral percutaneous nephrostomy tube placement  -Pt denies pain, N/V, fever, chills.  -Bilateral posterior PCNs in place. Both sites unremarkable with sutures/statlock in place. No redness, bleeding or drainage noted. Both dressings damp from leaking gravity bag.  -R PCN- 300 cc clear, yellow urine, 375 cc documented OP in Epic.  -L PCN- 400 cc clear, yellow urine, 500 cc documented OP in Epic.  -WBC WNL. He is afebrile  -Continue to document OP in Epic -Change dressing q shift or as needed  Contact IR with questions or concerns   Electronically Signed: Tyson Alias, NP 01/13/2022, 12:11 PM   I spent a total of 15 Minutes at the the patient's bedside AND on the patient's hospital floor or unit, greater than 50% of which was counseling/coordinating care for s/p bilateral nephrostomy tube placement.

## 2022-01-13 NOTE — Progress Notes (Signed)
PROGRESS NOTE    Jose Wolfe  CHY:[id number] DOB: Dec 16, 1946 DOA: 01/09/2022 PCP: [name], MD   Chief Complaint  Patient presents with   Chest Pain    Brief Narrative:   Jose Wolfe is a 76 y.o. male with medical history significant for metastatic prostate cancer undergoing chemotherapy, COPD, who is admitted to [facility] on 01/09/2022 with obstructive uropathy and acute kidney injury after presenting from home to [facility] ED complaining of chest pain.  CTA chest negative for PE, but work-up significant for bilateral hydronephrosis with worsening renal function, requiring bilateral nephrostomy tubes, and questionable colonic obstruction, unclear due to to severe constipation versus carcinomatosis, but overall clinically improving.  Good bowel movements, tolerating oral intake.   Assessment & Plan:   Principal Problem:   Large bowel obstruction (HCC) Active Problems:   COPD (chronic obstructive pulmonary disease) (HCC)   Obstructive uropathy   Bilateral hydronephrosis   AKI (acute kidney injury) (HCC)   Hyperkalemia   Atypical chest pain   Normocytic anemia   Bilateral obstructive uropathy - CT imaging shows evidence of interval development of moderate bilateral hydronephrosis secondary to probable malignant infiltration of the distal right ureter and extrinsic compression of the distal left ureter secondary to perirectal soft tissue. -Management per urology, status post bilateral nephrostomy tubes 1/16, renal function has normalized today, he has good output.  AKI -Due to obstructive uropathy, renal function has normalized today after insertion of nephrostomy tube yesterday.    Concern for colonic obstruction -partial SBO due to malignancy versus severe constipation with significant stool burden -General surgery input greatly appreciated, advance diet as tolerated, NGT discontinued this morning, has no nausea, no vomiting yesterday and he  did tolerate clear liquid diet, he is advanced to full liquid diet today.   -Significant constipation initially, but this has improved with multiple MiraLAX doses, suppositories, and SMOG  enemas, .  Chest pain -Atypical, most likely due to to osseous metastatic disease, CTA chest negative for PE, EKG nonacute, troponins negative x2.  Hyperkalemia -resolved with lokelma  Intraluminal mural mass within the bladder: -Urology on board  COPD  -No wheezing, continue with albuterol.  Metastatic prostate cancer -Currently on chemotherapy, he is due for Udenyca on day of admission, discussed with Dr. Alen Blew, recommendation for Granix x7 days during hospital stay especially he might call procedure.     DVT prophylaxis: Heparin Code Status: DNR Family Communication: None at bedside, I have discussed with patient in details and answered his questions. Disposition:   Status is: Inpatient  Remains inpatient appropriate because: Bowel obstruction, hydronephrosis.       Consultants:  Urology General surgery   Subjective:  No Nausea, no vomiting, tolerating clear liquid diet, multiple BMs yesterday, NGT discontinued this morning.     Objective: Vitals:   01/12/22 1400 01/12/22 1953 01/12/22 2001 01/13/22 0802  BP: 117/61 (!) 143/82  130/71  Pulse: 88 92  80  Resp: 18 20  14   Temp:  98.2 F (36.8 C)  98 F (36.7 C)  TempSrc:  Oral  Oral  SpO2:  100% 99% 98%  Weight:      Height:        Intake/Output Summary (Last 24 hours) at 01/13/2022 1229 Last data filed at 01/13/2022 0900 Gross per 24 hour  Intake 2649.95 ml  Output 875 ml  Net 1774.95 ml   Filed Weights   01/10/22 0714 01/11/22 0428 01/12/22 0500  Weight: 70.8 kg 70.3 kg 40.7 kg  Examination:  Awake Alert, Oriented X 3, No new F.N deficits, Normal affect Symmetrical Chest wall movement, Good air movement bilaterally, CTAB RRR,No Gallops,Rubs or new Murmurs, No Parasternal Heave +ve B.Sounds, Abd Soft, No  tenderness, No rebound - guarding or rigidity.  Bilateral nephrostomy. No Cyanosis, Clubbing or edema, No new Rash or bruise       Data Reviewed: I have personally reviewed following labs and imaging studies  CBC: Recent Labs  Lab 01/07/22 0841 01/09/22 1540 01/10/22 0304 01/11/22 0244 01/12/22 0128 01/13/22 0353  WBC 4.9 7.0 5.2 6.5 4.8 3.2*  NEUTROABS 3.7 6.6 4.7  --   --   --   HGB 7.7* 8.2* 8.1* 8.3* 8.5* 7.4*  HCT 22.7* 25.7* 25.5* 24.2* 25.1* 23.1*  MCV 90.4 93.5 93.4 89.6 91.3 93.5  PLT 287 247 230 232 303 903    Basic Metabolic Panel: Recent Labs  Lab 01/09/22 1540 01/10/22 0304 01/11/22 0244 01/12/22 0128 01/13/22 0353  NA 134* 134* 131* 137 137  K 5.3* 5.2* 4.6 4.3 3.4*  CL 104 103 103 105 107  CO2 20* 21* 20* 20* 23  GLUCOSE 94 98 107* 107* 114*  BUN 31* 27* 23 26* 16  CREATININE 1.66* 1.59* 1.63* 1.50* 0.91  CALCIUM 9.1 8.3* 7.6* 7.6* 7.3*  MG  --  1.8   1.8  --   --   --   PHOS  --  4.7*  --   --   --     GFR: Estimated Creatinine Clearance: 39.1 mL/min (by C-G formula based on SCr of 0.91 mg/dL).  Liver Function Tests: Recent Labs  Lab 01/07/22 0841 01/09/22 1540 01/10/22 0304  AST 23 98* 128*  ALT 14 15 16   ALKPHOS 51 54 77  BILITOT 0.3 0.8 0.6  PROT 6.7 6.0* 5.8*  ALBUMIN 3.7 3.1* 2.9*    CBG: Recent Labs  Lab 01/11/22 2031  GLUCAP 105*     Recent Results (from the past 240 hour(s))  Resp Panel by RT-PCR (Flu A&B, Covid) Nasopharyngeal Swab     Status: None   Collection Time: 01/09/22  2:46 PM   Specimen: Nasopharyngeal Swab; Nasopharyngeal(NP) swabs in vial transport medium  Result Value Ref Range Status   SARS Coronavirus 2 by RT PCR NEGATIVE NEGATIVE Final    Comment: (NOTE) SARS-CoV-2 target nucleic acids are NOT DETECTED.  The SARS-CoV-2 RNA is generally detectable in upper respiratory specimens during the acute phase of infection. The lowest concentration of SARS-CoV-2 viral copies this assay can detect is 138  copies/mL. A negative result does not preclude SARS-Cov-2 infection and should not be used as the sole basis for treatment or other patient management decisions. A negative result may occur with  improper specimen collection/handling, submission of specimen other than nasopharyngeal swab, presence of viral mutation(s) within the areas targeted by this assay, and inadequate number of viral copies(<138 copies/mL). A negative result must be combined with clinical observations, patient history, and epidemiological information. The expected result is Negative.  Fact Sheet for Patients:  EntrepreneurPulse.com.au  Fact Sheet for Healthcare Providers:  IncredibleEmployment.be  This test is no t yet approved or cleared by the [place] FDA and  has been authorized for detection and/or diagnosis of SARS-CoV-2 by FDA under an Emergency Use Authorization (EUA). This EUA will remain  in effect (meaning this test can be used) for the duration of the COVID-19 declaration under Section 564(b)(1) of the Act, 21 U.S.C.section 360bbb-3(b)(1), unless the authorization is terminated  or revoked  sooner.       Influenza A by PCR NEGATIVE NEGATIVE Final   Influenza B by PCR NEGATIVE NEGATIVE Final    Comment: (NOTE) The Xpert Xpress SARS-CoV-2/FLU/RSV plus assay is intended as an aid in the diagnosis of influenza from Nasopharyngeal swab specimens and should not be used as a sole basis for treatment. Nasal washings and aspirates are unacceptable for Xpert Xpress SARS-CoV-2/FLU/RSV testing.  Fact Sheet for Patients: EntrepreneurPulse.com.au  Fact Sheet for Healthcare Providers: IncredibleEmployment.be  This test is not yet approved or cleared by the [place] FDA and has been authorized for detection and/or diagnosis of SARS-CoV-2 by FDA under an Emergency Use Authorization (EUA). This EUA will remain in effect (meaning  this test can be used) for the duration of the COVID-19 declaration under Section 564(b)(1) of the Act, 21 U.S.C. section 360bbb-3(b)(1), unless the authorization is terminated or revoked.  Performed at [facility], [place] [address] [place], [place] [address]   Surgical pcr screen     Status: None   Collection Time: 01/12/22  4:36 AM   Specimen: Nasal Mucosa; Nasal Swab  Result Value Ref Range Status   MRSA, PCR NEGATIVE NEGATIVE Final   Staphylococcus aureus NEGATIVE NEGATIVE Final    Comment: (NOTE) The Xpert SA Assay (FDA approved for NASAL specimens in patients 52 years of age and older), is one component of a comprehensive surveillance program. It is not intended to diagnose infection nor to guide or monitor treatment. Performed at [facility], [place] [address], [place],  [address]          Radiology Studies: DG Abd Portable 1V  Result Date: 01/13/2022 CLINICAL DATA:  Follow-up small bowel obstruction. EXAM: PORTABLE ABDOMEN - 1 VIEW COMPARISON:  KUB 01/11/2022 CT abdomen and pelvis 01/09/2022 FINDINGS: An enteric tube is seen with tip overlying the expected region of the stomach and left upper quadrant. The side port overlies the expected region of the distal esophagus/gastroesophageal junction. There are bilateral renal pigtail catheters, likely bilateral external nephrostomy tubes. Numerous metallic densities overlie the prostate. There are vascular phleboliths overlying the pelvis. Stool is seen throughout the proximal to mid colon. There appears to be air within nondistended descending colon. Note is made that an internal hernia of small bowel extending into the right upper quadrant was seen contributing to partial obstruction of the transverse colon on prior CT. There are some small bowel loops measuring up to 3.5 cm within the right upper quadrant compatible with obstruction extending retrograde to the small bowel. No portal venous gas or pneumatosis is  seen. The lung bases are clear. No acute skeletal abnormality. IMPRESSION:: IMPRESSION: 1. There is again high-grade stool throughout the proximal colon. Note is made on prior CT of partial obstruction of the transverse colon associated with an internal hernia of small bowel. There now is mild dilatation of a loop of small bowel within the right upper quadrant, likely related to this partial bowel obstruction. 2. Tip of enteric tube overlies the stomach. Electronically Signed   By: Yvonne Kendall M.D.   On: 01/13/2022 08:58   IR NEPHROSTOMY PLACEMENT LEFT  Result Date: 01/12/2022 INDICATION: Bilateral hydronephrosis EXAM: ULTRASOUND AND FLUOROSCOPIC 10 FRENCH BILATERAL NEPHROSTOMIES COMPARISON:  01/09/2022 MEDICATIONS: 1% lidocaine local ANESTHESIA/SEDATION: Moderate (conscious) sedation was employed during this procedure. A total of Versed 1.5 mg and Fentanyl 70 mcg was administered intravenously by the radiology nurse. Total intra-service moderate Sedation Time: 19 minutes. The patient's level of consciousness and vital  signs were monitored continuously by radiology nursing throughout the procedure under my direct supervision. CONTRAST:  10 cc-administered into the collecting system(s) FLUOROSCOPY TIME:  Fluoroscopy Time: 3 minutes 18 seconds (8 mGy). COMPLICATIONS: None immediate. PROCEDURE: Informed written consent was obtained from the patient after a thorough discussion of the procedural risks, benefits and alternatives. All questions were addressed. Maximal Sterile Barrier Technique was utilized including caps, mask, sterile gowns, sterile gloves, sterile drape, hand hygiene and skin antiseptic. A timeout was performed prior to the initiation of the procedure. Left nephrostomy: Under sterile conditions and local anesthesia, percutaneous needle access performed of the dilated left kidney mid pole calyx. Needle position confirmed with ultrasound. Images obtained for documentation. Guidewire inserted followed  by tract dilatation to insert a 10 [place] drain. Retention loop formed the renal pelvis. Images obtained for documentation. Contrast injection confirms position. Catheter secured with Prolene suture and connected to external gravity drainage. Sterile dressing applied. Right nephrostomy: Under sterile conditions and local anesthesia, percutaneous needle access also performed of a dilated right kidney lower pole calyx. Needle position confirmed with ultrasound. Images obtained for documentation. There was return of clear urine. Guidewire inserted followed by tract dilatation to insert a 10 [place] drain. Retention loop formed the renal pelvis. Catheter secured with Prolene suture and connected to external gravity drainage. Sterile dressing applied. No immediate complication. IMPRESSION: Successful bilateral 10 [place] nephrostomies Electronically Signed   By: Jerilynn Mages.  Shick M.D.   On: 01/12/2022 14:48   IR NEPHROSTOMY PLACEMENT RIGHT  Result Date: 01/12/2022 INDICATION: Bilateral hydronephrosis EXAM: ULTRASOUND AND FLUOROSCOPIC 10 FRENCH BILATERAL NEPHROSTOMIES COMPARISON:  01/09/2022 MEDICATIONS: 1% lidocaine local ANESTHESIA/SEDATION: Moderate (conscious) sedation was employed during this procedure. A total of Versed 1.5 mg and Fentanyl 70 mcg was administered intravenously by the radiology nurse. Total intra-service moderate Sedation Time: 19 minutes. The patient's level of consciousness and vital signs were monitored continuously by radiology nursing throughout the procedure under my direct supervision. CONTRAST:  10 cc-administered into the collecting system(s) FLUOROSCOPY TIME:  Fluoroscopy Time: 3 minutes 18 seconds (8 mGy). COMPLICATIONS: None immediate. PROCEDURE: Informed written consent was obtained from the patient after a thorough discussion of the procedural risks, benefits and alternatives. All questions were addressed. Maximal Sterile Barrier Technique was utilized including caps, mask, sterile gowns,  sterile gloves, sterile drape, hand hygiene and skin antiseptic. A timeout was performed prior to the initiation of the procedure. Left nephrostomy: Under sterile conditions and local anesthesia, percutaneous needle access performed of the dilated left kidney mid pole calyx. Needle position confirmed with ultrasound. Images obtained for documentation. Guidewire inserted followed by tract dilatation to insert a 10 [place] drain. Retention loop formed the renal pelvis. Images obtained for documentation. Contrast injection confirms position. Catheter secured with Prolene suture and connected to external gravity drainage. Sterile dressing applied. Right nephrostomy: Under sterile conditions and local anesthesia, percutaneous needle access also performed of a dilated right kidney lower pole calyx. Needle position confirmed with ultrasound. Images obtained for documentation. There was return of clear urine. Guidewire inserted followed by tract dilatation to insert a 10 [place] drain. Retention loop formed the renal pelvis. Catheter secured with Prolene suture and connected to external gravity drainage. Sterile dressing applied. No immediate complication. IMPRESSION: Successful bilateral 10 [place] nephrostomies Electronically Signed   By: Jerilynn Mages.  Shick M.D.   On: 01/12/2022 14:48        Scheduled Meds:  bisacodyl  10 mg Rectal BID   docusate sodium  100 mg Oral BID   heparin  injection (subcutaneous)  5,000 Units Subcutaneous Q8H   mometasone-formoterol  2 puff Inhalation BID   mupirocin ointment  1 application Nasal BID   polyethylene glycol  17 g Oral BID   Tbo-filgastrim (GRANIX) SQ  480 mcg Subcutaneous q1800   Continuous Infusions:  sodium chloride 75 mL/hr at 01/12/22 0505   lactated ringers Stopped (01/12/22 1900)     LOS: 4 days       Phillips Climes, MD [facility]   To contact the attending provider between 7A-7P or the covering provider during after hours 7P-7A, please log into the  web site [email or web address] and access using universal [place] password for that web site. If you do not have the password, please call the hospital operator.  01/13/2022, 12:29 PM   Patient ID: Jose Wolfe, male   DOB: [birth year], 76 y.o.   MRN: [id number] Patient ID: Jose Wolfe, male   DOB: [birth year], 76 y.o.   MRN: [id number] Patient ID: Jose Wolfe, male   DOB: 22-Mar-1946, 76 y.o.   MRN: [id number] Patient ID: Jose Wolfe, male   DOB: Jul 05, 1946, 76 y.o.   MRN: [id number]

## 2022-01-13 NOTE — Care Management Important Message (Signed)
Important Message  Patient Details  Name: Jose Wolfe MRN: [id number] Date of Birth: October 21, 1946   Medicare Important Message Given:  Yes     Hannah Beat 01/13/2022, 11:54 AM

## 2022-01-13 NOTE — Plan of Care (Signed)
  Problem: Clinical Measurements: Goal: Respiratory complications will improve Outcome: Progressing   Problem: Clinical Measurements: Goal: Cardiovascular complication will be avoided Outcome: Progressing   Problem: Coping: Goal: Level of anxiety will decrease Outcome: Progressing   

## 2022-01-13 NOTE — Progress Notes (Signed)
Physical Therapy Treatment Patient Details Name: Jose Wolfe MRN: [id number] DOB: 12-Apr-1946 Today's Date: 01/13/2022   History of Present Illness Jose Wolfe is a 76 y.o. male, who is admitted to [facility] on 01/09/2022 with obstructive uropathy and acute kidney injury after presenting from home to [facility] ED complaining of chest pain; also with large bowel obstruction; bil nephrostomy tubes 1/16;  with medical history significant for metastatic prostate cancer undergoing chemotherapy, COPD    PT Comments    Continuing work on functional mobility and activity tolerance;  Showing good progress, able to walk hallway today; consider stair traning over next 1-2 sessions   Recommendations for follow up therapy are one component of a multi-disciplinary discharge planning process, led by the attending physician.  Recommendations may be updated based on patient status, additional functional criteria and insurance authorization.  Follow Up Recommendations  Home health PT     Assistance Recommended at Discharge Intermittent Supervision/Assistance  Patient can return home with the following Assistance with cooking/housework;Help with stairs or ramp for entrance   Equipment Recommendations  Rollator (4 wheels);BSC/3in1    Recommendations for Other Services       Precautions / Restrictions Precautions Precautions: Fall Precaution Comments: monitor orthostatics until they prove to be stable     Mobility  Bed Mobility Overal bed mobility: Needs Assistance Bed Mobility: Supine to Sit     Supine to sit: Min assist     General bed mobility comments: min handheld assist to pull to sit    Transfers Overall transfer level: Needs assistance Equipment used: 1 person hand held assist Transfers: Sit to/from Stand Sit to Stand: Min assist           General transfer comment: Min assist to steady; cues to self-monitor for activity tolerance; better control of  descent to sit    Ambulation/Gait Ambulation/Gait assistance: Min guard, +2 safety/equipment Gait Distance (Feet): 70 Feet Assistive device: Rolling walker (2 wheels) Gait Pattern/deviations: Step-through pattern, Trunk flexed (upper trunk flexed)       General Gait Details: Cues to self-monitor for activity tolerance; slow pace; no dizziness   Stairs             Wheelchair Mobility    Modified Rankin (Stroke Patients Only)       Balance     Sitting balance-Leahy Scale: Fair       Standing balance-Leahy Scale: Fair                              Cognition Arousal/Alertness: Awake/alert Behavior During Therapy: WFL for tasks assessed/performed Overall Cognitive Status: Within Functional Limits for tasks assessed                                          Exercises      General Comments General comments (skin integrity, edema, etc.): BP demonstrated a decr in standing, but not greater than 20 mmHg, and pt asymptomatic for dizziness      Pertinent Vitals/Pain Pain Assessment Pain Assessment: No/denies pain Pain Location: Discomfort at R nephrostomy tube site Pain Descriptors / Indicators:  ("pulling")    Home Living                          Prior Function  PT Goals (current goals can now be found in the care plan section) Acute Rehab PT Goals Patient Stated Goal: to feel better PT Goal Formulation: With patient Time For Goal Achievement: 01/25/22 Potential to Achieve Goals: Good Progress towards PT goals: Progressing toward goals    Frequency    Min 3X/week      PT Plan Current plan remains appropriate    Co-evaluation              AM-PAC PT "6 Clicks" Mobility   Outcome Measure  Help needed turning from your back to your side while in a flat bed without using bedrails?: None Help needed moving from lying on your back to sitting on the side of a flat bed without using bedrails?: A  Little Help needed moving to and from a bed to a chair (including a wheelchair)?: A Little Help needed standing up from a chair using your arms (e.g., wheelchair or bedside chair)?: A Little Help needed to walk in hospital room?: A Little Help needed climbing 3-5 steps with a railing? : A Lot 6 Click Score: 18    End of Session Equipment Utilized During Treatment: Gait belt Activity Tolerance: Patient tolerated treatment well Patient left: in chair;with call bell/phone within reach Nurse Communication: Mobility status PT Visit Diagnosis: Unsteadiness on feet (R26.81);Other abnormalities of gait and mobility (R26.89)     Time: 1100-1137 PT Time Calculation (min) (ACUTE ONLY): 37 min  Charges:  $Gait Training: 23-37 mins                     Roney Marion, [place]  Acute Rehabilitation Services Pager [phone] Office [place] 01/13/2022, 4:17 PM

## 2022-01-13 NOTE — Progress Notes (Signed)
Mobility Specialist Progress Note   01/13/22 1500  Mobility  Activity Ambulated with assistance to bathroom  Level of Assistance Minimal assist, patient does 75% or more  Assistive Device Front wheel walker  Distance Ambulated (ft) 16 ft  Activity Response Tolerated well  $Mobility charge 1 Mobility   Received pt in chair requesting to use the BR and return back to bed, reporting no symptoms before tx. MinA for initial stand from chair but contact G for remainder of tx. Successful BM. Returned BTB w/ needs met and no complaints, call bell in reach.   During Mobility: 95 HR, 126/69 BP, 99% SpO2  [place]land Falling Mobility Specialist Phone Number [phone]

## 2022-01-13 NOTE — Progress Notes (Signed)
Subjective/Chief Complaint:  Pt with BMs yesterday States tol clears mobilizing   Objective: Vital signs in last 24 hours: Temp:  [97.5 F (36.4 C)-98.2 F (36.8 C)] 98.2 F (36.8 C) (01/16 1953) Pulse Rate:  [88-100] 92 (01/16 1953) Resp:  [10-20] 20 (01/16 1953) BP: (102-143)/(61-82) 143/82 (01/16 1953) SpO2:  [96 %-100 %] 99 % (01/16 2001) Last BM Date: 01/12/22  Intake/Output from previous day: 01/16 0701 - 01/17 0700 In: 2410 [P.O.:240; I.V.:2170] Out: 1075 [Urine:875; Emesis/NG output:200] Intake/Output this shift: No intake/output data recorded.  PE:  Constitutional: No acute distress, conversant, appears states age. Eyes: Anicteric sclerae, moist conjunctiva, no lid lag Lungs: Clear to auscultation bilaterally, normal respiratory effort CV: regular rate and rhythm, no murmurs, no peripheral edema, pedal pulses 2+ GI: Soft, no masses or hepatosplenomegaly, non-tender to palpation Skin: No rashes, palpation reveals normal turgor Psychiatric: appropriate judgment and insight, oriented to person, place, and time   Lab Results:  Recent Labs    01/12/22 0128 01/13/22 0353  WBC 4.8 3.2*  HGB 8.5* 7.4*  HCT 25.1* 23.1*  PLT 303 306   BMET Recent Labs    01/12/22 0128 01/13/22 0353  NA 137 137  K 4.3 3.4*  CL 105 107  CO2 20* 23  GLUCOSE 107* 114*  BUN 26* 16  CREATININE 1.50* 0.91  CALCIUM 7.6* 7.3*   PT/INR No results for input(s): LABPROT, INR in the last 72 hours. ABG No results for input(s): PHART, HCO3 in the last 72 hours.  Invalid input(s): PCO2, PO2  Studies/Results: IR NEPHROSTOMY PLACEMENT LEFT  Result Date: 01/12/2022 INDICATION: Bilateral hydronephrosis EXAM: ULTRASOUND AND FLUOROSCOPIC 10 FREN[place] BILATERAL NEPHROSTOMIES COMPARISON:  01/09/2022 MEDICATIONS: 1% lidocaine local ANESTHESIA/SEDATION: Moderate (conscious) sedation was employed during this procedure. A total of Versed 1.5 mg and Fentanyl 70 mcg was administered  intravenously by the radiology nurse. Total intra-service moderate Sedation Time: 19 minutes. The patient's level of consciousness and vital signs were monitored continuously by radiology nursing throughout the procedure under my direct supervision. CONTRAST:  10 cc-administered into the collecting system(s) FLUOROSCOPY TIME:  Fluoroscopy Time: 3 minutes 18 seconds (8 mGy). COMPLICATIONS: None immediate. PROCEDURE: Informed written consent was obtained from the patient after a thorough discussion of the procedural risks, benefits and alternatives. All questions were addressed. Maximal Sterile Barrier Technique was utilized including caps, mask, sterile gowns, sterile gloves, sterile drape, hand hygiene and skin antiseptic. A timeout was performed prior to the initiation of the procedure. Left nephrostomy: Under sterile conditions and local anesthesia, percutaneous needle access performed of the dilated left kidney mid pole calyx. Needle position confirmed with ultrasound. Images obtained for documentation. Guidewire inserted followed by tract dilatation to insert a 10 [place] drain. Retention loop formed the renal pelvis. Images obtained for documentation. Contrast injection confirms position. Catheter secured with Prolene suture and connected to external gravity drainage. Sterile dressing applied. Right nephrostomy: Under sterile conditions and local anesthesia, percutaneous needle access also performed of a dilated right kidney lower pole calyx. Needle position confirmed with ultrasound. Images obtained for documentation. There was return of clear urine. Guidewire inserted followed by tract dilatation to insert a 10 [place] drain. Retention loop formed the renal pelvis. Catheter secured with Prolene suture and connected to external gravity drainage. Sterile dressing applied. No immediate complication. IMPRESSION: Successful bilateral 10 [place] nephrostomies Electronically Signed   By: Jerilynn Mages.  Shick M.D.   On: 01/12/2022  14:48   IR NEPHROSTOMY PLACEMENT RIGHT  Result Date: 01/12/2022 INDICATION: Bilateral hydronephrosis EXAM:  ULTRASOUND AND FLUOROSCOPIC 10 FRENCH BILATERAL NEPHROSTOMIES COMPARISON:  01/09/2022 MEDICATIONS: 1% lidocaine local ANESTHESIA/SEDATION: Moderate (conscious) sedation was employed during this procedure. A total of Versed 1.5 mg and Fentanyl 70 mcg was administered intravenously by the radiology nurse. Total intra-service moderate Sedation Time: 19 minutes. The patient's level of consciousness and vital signs were monitored continuously by radiology nursing throughout the procedure under my direct supervision. CONTRAST:  10 cc-administered into the collecting system(s) FLUOROSCOPY TIME:  Fluoroscopy Time: 3 minutes 18 seconds (8 mGy). COMPLICATIONS: None immediate. PROCEDURE: Informed written consent was obtained from the patient after a thorough discussion of the procedural risks, benefits and alternatives. All questions were addressed. Maximal Sterile Barrier Technique was utilized including caps, mask, sterile gowns, sterile gloves, sterile drape, hand hygiene and skin antiseptic. A timeout was performed prior to the initiation of the procedure. Left nephrostomy: Under sterile conditions and local anesthesia, percutaneous needle access performed of the dilated left kidney mid pole calyx. Needle position confirmed with ultrasound. Images obtained for documentation. Guidewire inserted followed by tract dilatation to insert a 10 [place] drain. Retention loop formed the renal pelvis. Images obtained for documentation. Contrast injection confirms position. Catheter secured with Prolene suture and connected to external gravity drainage. Sterile dressing applied. Right nephrostomy: Under sterile conditions and local anesthesia, percutaneous needle access also performed of a dilated right kidney lower pole calyx. Needle position confirmed with ultrasound. Images obtained for documentation. There was return of  clear urine. Guidewire inserted followed by tract dilatation to insert a 10 [place] drain. Retention loop formed the renal pelvis. Catheter secured with Prolene suture and connected to external gravity drainage. Sterile dressing applied. No immediate complication. IMPRESSION: Successful bilateral 10 [place] nephrostomies Electronically Signed   By: Jerilynn Mages.  Shick M.D.   On: 01/12/2022 14:48    Anti-infectives: Anti-infectives (From admission, onward)    Start     Dose/Rate Route Frequency Ordered Stop   01/12/22 0600  cefTRIAXone (ROCEPHIN) 2 g in sodium chloride 0.9 % 100 mL IVPB        2 g 200 mL/hr over 30 Minutes Intravenous On call 01/11/22 1427 01/12/22 0845   01/10/22 1400  ceFAZolin (ANCEF) IVPB 2g/100 mL premix        2 g 200 mL/hr over 30 Minutes Intravenous  Once 01/10/22 1345 01/10/22 1445       Assessment/Plan: Metastatic prostate cancer on chemo Concern for a colonic obstruction - CT scan questions internal hernia and a partial obstruction of the transverse colon, as well as large stool burden proximal to the point of obstruction. It also reports progressive disease since last PET scan despite being on chemo. Patient is a poor surgical candidate. Fortunately he does not have any abdominal complaints. Denies abdominal pain, nausea, or vomiting. Would not recommend any surgical intervention. Agree with aggressive bowel regimen  - he is on BID miralax and dulcolax suppositories.    ID - none FEN - D/c'd NGT. OK to adv diet to fulls today VTE - SCDs/ per primary Foley - none   Mild moderate bilateral hydronephrosis in the setting of AKI - urology following, s/p Perc nephro tubes COPD Atypical chest pain DNR/DNI     Moderate Medical Decision Making   LOS: 4 days    Ralene Ok 01/13/2022

## 2022-01-14 DIAGNOSIS — T451X5A Adverse effect of antineoplastic and immunosuppressive drugs, initial encounter: Secondary | ICD-10-CM

## 2022-01-14 DIAGNOSIS — D701 Agranulocytosis secondary to cancer chemotherapy: Secondary | ICD-10-CM

## 2022-01-14 LAB — ABO/RH: ABO/RH(D): A POS

## 2022-01-14 LAB — BASIC METABOLIC PANEL
Anion gap: 6 (ref 5–15)
BUN: 9 mg/dL (ref 8–23)
CO2: 22 mmol/L (ref 22–32)
Calcium: 7.3 mg/dL — ABNORMAL LOW (ref 8.9–10.3)
Chloride: 109 mmol/L (ref 98–111)
Creatinine, Ser: 0.77 mg/dL (ref 0.61–1.24)
GFR, Estimated: >60 mL/min (ref >60)
Glucose, Bld: 96 mg/dL (ref 70–99)
Potassium: 4 mmol/L (ref 3.5–5.1)
Sodium: 137 mmol/L (ref 135–145)

## 2022-01-14 LAB — CBC
HCT: 21.7 % — ABNORMAL LOW (ref 39.0–52.0)
Hemoglobin: 7.1 g/dL — ABNORMAL LOW (ref 13.0–17.0)
MCH: 30.5 pg (ref 26.0–34.0)
MCHC: 32.7 g/dL (ref 30.0–36.0)
MCV: 93.1 fL (ref 80.0–100.0)
Platelets: 335 10*3/uL (ref 150–400)
RBC: 2.33 MIL/uL — ABNORMAL LOW (ref 4.22–5.81)
RDW: 20.2 % — ABNORMAL HIGH (ref 11.5–15.5)
WBC: 2.9 10*3/uL — ABNORMAL LOW (ref 4.0–10.5)
nRBC: 1 % — ABNORMAL HIGH (ref 0.0–0.2)

## 2022-01-14 LAB — PREPARE RBC (CROSSMATCH)

## 2022-01-14 MED ORDER — SODIUM CHLORIDE 0.9% IV SOLUTION: Freq: Once | INTRAVENOUS | Status: AC

## 2022-01-14 NOTE — Assessment & Plan Note (Signed)
-  Stable, no wheezing

## 2022-01-14 NOTE — Assessment & Plan Note (Signed)
-  Due to acute kidney injury, resolved with [facility]

## 2022-01-14 NOTE — Assessment & Plan Note (Addendum)
-  Due to chronic illness, metastatic disease, chemotherapy.  Hemoglobin trending down to 7.1 at 1 point, no bleeding was noted.  He received a unit of packed red blood cells with adequate response.  Outpatient follow-up with oncology

## 2022-01-14 NOTE — Plan of Care (Signed)

## 2022-01-14 NOTE — Hospital Course (Signed)
Jose Wolfe is a 76 y.o. male with medical history significant for metastatic prostate cancer undergoing chemotherapy, COPD, who is admitted to [facility] on 01/09/2022 with obstructive uropathy and acute kidney injury after presenting from home to [facility] ED complaining of chest pain.  CTA chest negative for PE, but work-up significant for bilateral hydronephrosis with worsening renal function, requiring bilateral nephrostomy tubes, and questionable colonic obstruction, unclear due to to severe constipation versus carcinomatosis, but overall clinically improving.  Good bowel movements, tolerating oral intake.

## 2022-01-14 NOTE — Progress Notes (Signed)
PROGRESS NOTE  Jose Wolfe IWP:[id number] DOB: Apr 10, 1946 DOA: 01/09/2022 PCP: [name], MD   LOS: 5 days   Brief Narrative / Interim history: Jose Wolfe is a 76 y.o. male with medical history significant for metastatic prostate cancer undergoing chemotherapy, COPD, who is admitted to [facility] on 01/09/2022 with obstructive uropathy and acute kidney injury after presenting from home to [facility] ED complaining of chest pain.  CTA chest negative for PE, but work-up significant for bilateral hydronephrosis with worsening renal function, requiring bilateral nephrostomy tubes, and questionable colonic obstruction, unclear due to to severe constipation versus carcinomatosis, but overall clinically improving.  Good bowel movements, tolerating oral intake.  Subjective / 24h Interval events: -Feels overall better.  No abdominal pain, no nausea or vomiting.  Assessment & Plan: * Large bowel obstruction ([place])- (present on admission) -There was concern for partial bowel obstruction due to malignancy versus severe constipation with significant stool burden.  General surgery consulted, he was managed with n.p.o., NG tube and aggressive bowel regimen.  He has improved, continue bowel regimen, NG tube has been discontinued and currently tolerating a soft diet.  AKI (acute kidney injury) ([place]) due to bilateral obstructive uropathy- (present on admission) -CT imaging on admission showed moderate bilateral hydronephrosis secondary to probable malignant infiltration of the distal right ureter and extrinsic compression of the distal left ureter secondary to perirectal soft tissue.  Urology consulted, recommending bilateral nephrostomy tubes which were placed on 1/16 by IR.  With that, renal function has normalized  Normocytic anemia- (present on admission) -Due to chronic illness, metastatic disease, chemotherapy.  Hemoglobin trending down, 7.1 this morning, no bleeding, will  transfuse unit of packed red blood cells  Atypical chest pain- (present on admission) -Atypical, due to osseous metastatic disease, CT angiogram negative for PE, EKG nonacute, troponin negative x2  Hyperkalemia- (present on admission) -Due to acute kidney injury, resolved with Lokelma  COPD (chronic obstructive pulmonary disease) ([place])- (present on admission) -Stable, no wheezing  Metastatic castration-resistant adenocarcinoma of prostate ([place])- (present on admission) -Currently on chemotherapy, recommended Granix x7 days during hospital stay    Scheduled Meds:  sodium chloride   Intravenous Once   docusate sodium  100 mg Oral BID   heparin injection (subcutaneous)  5,000 Units Subcutaneous Q8H   mometasone-formoterol  2 puff Inhalation BID   mupirocin ointment  1 application Nasal BID   polyethylene glycol  17 g Oral BID   Tbo-filgastrim (GRANIX) SQ  480 mcg Subcutaneous q1800   Continuous Infusions:  sodium chloride 75 mL/hr at 01/14/22 0419   lactated ringers Stopped (01/12/22 1900)   PRN Meds:.acetaminophen **OR** acetaminophen, albuterol, HYDROmorphone (DILAUDID) injection, naLOXone (NARCAN)  injection, ondansetron (ZOFRAN) IV  Diet Orders (From admission, onward)     Start     Ordered   01/14/22 0747  DIET SOFT Room service appropriate? Yes; Fluid consistency: Thin  Diet effective now       Question Answer Comment  Room service appropriate? Yes   Fluid consistency: Thin      01/14/22 0746            DVT prophylaxis: heparin injection 5,000 Units Start: 01/10/22 1409 SCD's Start: 01/10/22 1346 SCDs Start: 01/09/22 1918   Lab Results  Component Value Date   PLT 335 01/14/2022      Code Status: DNR  Family Communication: No family at bedside  Status is: Inpatient  Remains inpatient appropriate because: Anticipate DC 24-48 hours  Level of care: Progressive  Consultants:  General surgery IR  Procedures:  Percutaneous nephrostomy,  bilateral  Microbiology  none  Antimicrobials: none    Objective: Vitals:   01/13/22 2030 01/13/22 2114 01/14/22 0715 01/14/22 0840  BP: 120/66  115/64   Pulse: 90     Resp: (!) 22     Temp: 97.7 F (36.5 C)  98.2 F (36.8 C)   TempSrc: Oral     SpO2: 99% 99%  100%  Weight:      Height:        Intake/Output Summary (Last 24 hours) at 01/14/2022 1152 Last data filed at 01/14/2022 1100 Gross per 24 hour  Intake 827.79 ml  Output 3775 ml  Net -2947.21 ml   Wt Readings from Last 3 Encounters:  01/12/22 40.7 kg  01/07/22 71 kg  12/17/21 73.7 kg    Examination:  Constitutional: NAD Eyes: no scleral icterus ENMT: Mucous membranes are moist.  Neck: normal, supple Respiratory: clear to auscultation bilaterally, no wheezing, no crackles.  Cardiovascular: Regular rate and rhythm, no murmurs / rubs / gallops. No LE edema.  Abdomen: non distended, no tenderness. Bowel sounds positive.  Musculoskeletal: no clubbing / cyanosis.  Skin: no rashes Neurologic: Nonfocal   Data Reviewed: I have independently reviewed following labs and imaging studies   CBC Recent Labs  Lab 01/09/22 1540 01/10/22 0304 01/11/22 0244 01/12/22 0128 01/13/22 0353 01/14/22 0211  WBC 7.0 5.2 6.5 4.8 3.2* 2.9*  HGB 8.2* 8.1* 8.3* 8.5* 7.4* 7.1*  HCT 25.7* 25.5* 24.2* 25.1* 23.1* 21.7*  PLT 247 230 232 303 306 335  MCV 93.5 93.4 89.6 91.3 93.5 93.1  MCH 29.8 29.7 30.7 30.9 30.0 30.5  MCHC 31.9 31.8 34.3 33.9 32.0 32.7  RDW 20.7* 20.8* 20.0* 20.2* 20.2* 20.2*  LYMPHSABS 0.2* 0.2*  --   --   --   --   MONOABS 0.2 0.2  --   --   --   --   EOSABS 0.0 0.0  --   --   --   --   BASOSABS 0.0 0.0  --   --   --   --     Recent Labs  Lab 01/09/22 1540 01/10/22 0304 01/11/22 0244 01/12/22 0128 01/13/22 0353 01/14/22 0211  NA 134* 134* 131* 137 137 137  K 5.3* 5.2* 4.6 4.3 3.4* 4.0  CL 104 103 103 105 107 109  CO2 20* 21* 20* 20* 23 22  GLUCOSE 94 98 107* 107* 114* 96  BUN 31* 27* 23 26*  16 9  CREATININE 1.66* 1.59* 1.63* 1.50* 0.91 0.77  CALCIUM 9.1 8.3* 7.6* 7.6* 7.3* 7.3*  AST 98* 128*  --   --   --   --   ALT 15 16  --   --   --   --   ALKPHOS 54 77  --   --   --   --   BILITOT 0.8 0.6  --   --   --   --   ALBUMIN 3.1* 2.9*  --   --   --   --   MG  --  1.8   1.8  --   --   --   --   INR  --  1.1  --   --   --   --     ------------------------------------------------------------------------------------------------------------------ No results for input(s): CHOL, HDL, LDLCALC, TRIG, CHOLHDL, LDLDIRECT in the last 72 hours.  No results found for: HGBA1C ------------------------------------------------------------------------------------------------------------------ No results for input(s):  TSH, T4TOTAL, T3FREE, THYROIDAB in the last 72 hours.  Invalid input(s): FREET3  Cardiac Enzymes No results for input(s): CKMB, TROPONINI, MYOGLOBIN in the last 168 hours.  Invalid input(s): CK ------------------------------------------------------------------------------------------------------------------ No results found for: BNP  CBG: Recent Labs  Lab 01/11/22 2031  GLUCAP 105*    Recent Results (from the past 240 hour(s))  Resp Panel by RT-PCR (Flu A&B, Covid) Nasopharyngeal Swab     Status: None   Collection Time: 01/09/22  2:46 PM   Specimen: Nasopharyngeal Swab; Nasopharyngeal(NP) swabs in vial transport medium  Result Value Ref Range Status   SARS Coronavirus 2 by RT PCR NEGATIVE NEGATIVE Final    Comment: (NOTE) SARS-CoV-2 target nucleic acids are NOT DETECTED.  The SARS-CoV-2 RNA is generally detectable in upper respiratory specimens during the acute phase of infection. The lowest concentration of SARS-CoV-2 viral copies this assay can detect is 138 copies/mL. A negative result does not preclude SARS-Cov-2 infection and should not be used as the sole basis for treatment or other patient management decisions. A negative result may occur with  improper  specimen collection/handling, submission of specimen other than nasopharyngeal swab, presence of viral mutation(s) within the areas targeted by this assay, and inadequate number of viral copies(<138 copies/mL). A negative result must be combined with clinical observations, patient history, and epidemiological information. The expected result is Negative.  Fact Sheet for Patients:  EntrepreneurPulse.com.au  Fact Sheet for Healthcare Providers:  IncredibleEmployment.be  This test is no t yet approved or cleared by the [place] FDA and  has been authorized for detection and/or diagnosis of SARS-CoV-2 by FDA under an Emergency Use Authorization (EUA). This EUA will remain  in effect (meaning this test can be used) for the duration of the COVID-19 declaration under Section 564(b)(1) of the Act, 21 U.S.C.section 360bbb-3(b)(1), unless the authorization is terminated  or revoked sooner.       Influenza A by PCR NEGATIVE NEGATIVE Final   Influenza B by PCR NEGATIVE NEGATIVE Final    Comment: (NOTE) The Xpert Xpress SARS-CoV-2/FLU/RSV plus assay is intended as an aid in the diagnosis of influenza from Nasopharyngeal swab specimens and should not be used as a sole basis for treatment. Nasal washings and aspirates are unacceptable for Xpert Xpress SARS-CoV-2/FLU/RSV testing.  Fact Sheet for Patients: EntrepreneurPulse.com.au  Fact Sheet for Healthcare Providers: IncredibleEmployment.be  This test is not yet approved or cleared by the [place] FDA and has been authorized for detection and/or diagnosis of SARS-CoV-2 by FDA under an Emergency Use Authorization (EUA). This EUA will remain in effect (meaning this test can be used) for the duration of the COVID-19 declaration under Section 564(b)(1) of the Act, 21 U.S.C. section 360bbb-3(b)(1), unless the authorization is terminated or revoked.  Performed at  [facility], [place] [address], [place], [place] [address]   Surgical pcr screen     Status: None   Collection Time: 01/12/22  4:36 AM   Specimen: Nasal Mucosa; Nasal Swab  Result Value Ref Range Status   MRSA, PCR NEGATIVE NEGATIVE Final   Staphylococcus aureus NEGATIVE NEGATIVE Final    Comment: (NOTE) The Xpert SA Assay (FDA approved for NASAL specimens in patients 14 years of age and older), is one component of a comprehensive surveillance program. It is not intended to diagnose infection nor to guide or monitor treatment. Performed at [facility], [place] [address] [place], [place] [address]      Radiology Studies: No results found.   Wyvonne Carda  Cruzita Lederer, MD, PhD [facility]  Between 7 am - 7 pm I am available, please contact me via Amion (for emergencies) or Securechat (non urgent messages)  Between 7 pm - 7 am I am not available, please contact night coverage MD/APP via Amion

## 2022-01-14 NOTE — Assessment & Plan Note (Addendum)
-  Outpatient follow-up with primary oncologist, getting chemotherapy

## 2022-01-14 NOTE — Progress Notes (Signed)
Mobility Specialist Progress Note   01/14/22 1610  Mobility  Activity Transferred from chair to bed  Level of Assistance Standby assist, set-up cues, supervision of patient - no hands on  Assistive Device Front wheel walker  Distance Ambulated (ft) 4 ft  Activity Response Tolerated well  $Mobility charge 1 Mobility   Pt received in chair finishing transfusion and requesting to get back to bed. Stand by A for tx back to EOB and min A for physical assistance of LE d/t gen weakness. left w/ call bell in reach, no complaints and RN present.    Holland Falling Mobility Specialist Phone Number [phone]

## 2022-01-14 NOTE — Assessment & Plan Note (Signed)
-  Atypical, due to osseous metastatic disease, CT angiogram negative for PE, EKG nonacute, troponin negative x2

## 2022-01-14 NOTE — Progress Notes (Signed)
Mobility Specialist Progress Note   01/14/22 1200  Mobility  Activity Transferred from bed to chair  Level of Assistance Minimal assist, patient does 75% or more  Assistive Device Front wheel walker  Distance Ambulated (ft) 40 ft  Activity Response Tolerated well  $Mobility charge 1 Mobility   Received in bed having no complaints and agreeable. Mod I for bed mobility and min A for initial stand from EOB, contact G for remainder of session. Asx throughout w/ min cues needed for hand placement when seating. Left w/ call bell in reach and RN in room.   Holland Falling Mobility Specialist Phone Number [phone]

## 2022-01-14 NOTE — Plan of Care (Signed)
  Problem: Education: Goal: Knowledge of General Education information will improve Description Including pain rating scale, medication(s)/side effects and non-pharmacologic comfort measures Outcome: Progressing   Problem: Health Behavior/Discharge Planning: Goal: Ability to manage health-related needs will improve Outcome: Progressing   

## 2022-01-14 NOTE — Assessment & Plan Note (Addendum)
-  CT imaging on admission showed moderate bilateral hydronephrosis secondary to probable malignant infiltration of the distal right ureter and extrinsic compression of the distal left ureter secondary to perirectal soft tissue.  Urology consulted, recommending bilateral nephrostomy tubes which were placed on 1/16 by IR. He has remained stable, renal function has normalized and will be discharged home in stable condition with Urology and IR follow up

## 2022-01-14 NOTE — Progress Notes (Addendum)
Subjective/Chief Complaint: Pt tol PO Having BMs   Objective: Vital signs in last 24 hours: Temp:  [97.7 F (36.5 C)-98.2 F (36.8 C)] 98.2 F (36.8 C) (01/18 0715) Pulse Rate:  [80-90] 90 (01/17 2030) Resp:  [14-22] 22 (01/17 2030) BP: (115-130)/(64-71) 115/64 (01/18 0715) SpO2:  [98 %-99 %] 99 % (01/17 2114) Last BM Date: 01/13/22  Intake/Output from previous day: 01/17 0701 - 01/18 0700 In: 1067.8 [P.O.:480; I.V.:587.8] Out: 2225 [Urine:2225] Intake/Output this shift: No intake/output data recorded.  PE:  Constitutional: No acute distress, conversant, appears states age. Eyes: Anicteric sclerae, moist conjunctiva, no lid lag Lungs: Clear to auscultation bilaterally, normal respiratory effort CV: regular rate and rhythm, no murmurs, no peripheral edema, pedal pulses 2+ GI: Soft, no masses or hepatosplenomegaly, non-tender to palpation Skin: No rashes, palpation reveals normal turgor Psychiatric: appropriate judgment and insight, oriented to person, place, and time   Lab Results:  Recent Labs    01/13/22 0353 01/14/22 0211  WBC 3.2* 2.9*  HGB 7.4* 7.1*  HCT 23.1* 21.7*  PLT 306 335   BMET Recent Labs    01/13/22 0353 01/14/22 0211  NA 137 137  K 3.4* 4.0  CL 107 109  CO2 23 22  GLUCOSE 114* 96  BUN 16 9  CREATININE 0.91 0.77  CALCIUM 7.3* 7.3*   PT/INR No results for input(s): LABPROT, INR in the last 72 hours. ABG No results for input(s): PHART, HCO3 in the last 72 hours.  Invalid input(s): PCO2, PO2  Studies/Results: DG Abd Portable 1V  Result Date: 01/13/2022 CLINICAL DATA:  Follow-up small bowel obstruction. EXAM: PORTABLE ABDOMEN - 1 VIEW COMPARISON:  KUB 01/11/2022 CT abdomen and pelvis 01/09/2022 FINDINGS: An enteric tube is seen with tip overlying the expected region of the stomach and left upper quadrant. The side port overlies the expected region of the distal esophagus/gastroesophageal junction. There are bilateral renal pigtail  catheters, likely bilateral external nephrostomy tubes. Numerous metallic densities overlie the prostate. There are vascular phleboliths overlying the pelvis. Stool is seen throughout the proximal to mid colon. There appears to be air within nondistended descending colon. Note is made that an internal hernia of small bowel extending into the right upper quadrant was seen contributing to partial obstruction of the transverse colon on prior CT. There are some small bowel loops measuring up to 3.5 cm within the right upper quadrant compatible with obstruction extending retrograde to the small bowel. No portal venous gas or pneumatosis is seen. The lung bases are clear. No acute skeletal abnormality. IMPRESSION:: IMPRESSION: 1. There is again high-grade stool throughout the proximal colon. Note is made on prior CT of partial obstruction of the transverse colon associated with an internal hernia of small bowel. There now is mild dilatation of a loop of small bowel within the right upper quadrant, likely related to this partial bowel obstruction. 2. Tip of enteric tube overlies the stomach. Electronically Signed   By: Yvonne Kendall M.D.   On: 01/13/2022 08:58   IR NEPHROSTOMY PLACEMENT LEFT  Result Date: 01/12/2022 INDICATION: Bilateral hydronephrosis EXAM: ULTRASOUND AND FLUOROSCOPIC 10 FRENCH BILATERAL NEPHROSTOMIES COMPARISON:  01/09/2022 MEDICATIONS: 1% lidocaine local ANESTHESIA/SEDATION: Moderate (conscious) sedation was employed during this procedure. A total of Versed 1.5 mg and Fentanyl 70 mcg was administered intravenously by the radiology nurse. Total intra-service moderate Sedation Time: 19 minutes. The patient's level of consciousness and vital signs were monitored continuously by radiology nursing throughout the procedure under my direct supervision. CONTRAST:  10 cc-administered  into the collecting system(s) FLUOROSCOPY TIME:  Fluoroscopy Time: 3 minutes 18 seconds (8 mGy). COMPLICATIONS: None  immediate. PROCEDURE: Informed written consent was obtained from the patient after a thorough discussion of the procedural risks, benefits and alternatives. All questions were addressed. Maximal Sterile Barrier Technique was utilized including caps, mask, sterile gowns, sterile gloves, sterile drape, hand hygiene and skin antiseptic. A timeout was performed prior to the initiation of the procedure. Left nephrostomy: Under sterile conditions and local anesthesia, percutaneous needle access performed of the dilated left kidney mid pole calyx. Needle position confirmed with ultrasound. Images obtained for documentation. Guidewire inserted followed by tract dilatation to insert a 10 [place] drain. Retention loop formed the renal pelvis. Images obtained for documentation. Contrast injection confirms position. Catheter secured with Prolene suture and connected to external gravity drainage. Sterile dressing applied. Right nephrostomy: Under sterile conditions and local anesthesia, percutaneous needle access also performed of a dilated right kidney lower pole calyx. Needle position confirmed with ultrasound. Images obtained for documentation. There was return of clear urine. Guidewire inserted followed by tract dilatation to insert a 10 [place] drain. Retention loop formed the renal pelvis. Catheter secured with Prolene suture and connected to external gravity drainage. Sterile dressing applied. No immediate complication. IMPRESSION: Successful bilateral 10 [place] nephrostomies Electronically Signed   By: Jerilynn Mages.  Shick M.D.   On: 01/12/2022 14:48   IR NEPHROSTOMY PLACEMENT RIGHT  Result Date: 01/12/2022 INDICATION: Bilateral hydronephrosis EXAM: ULTRASOUND AND FLUOROSCOPIC 10 FRENCH BILATERAL NEPHROSTOMIES COMPARISON:  01/09/2022 MEDICATIONS: 1% lidocaine local ANESTHESIA/SEDATION: Moderate (conscious) sedation was employed during this procedure. A total of Versed 1.5 mg and Fentanyl 70 mcg was administered intravenously by  the radiology nurse. Total intra-service moderate Sedation Time: 19 minutes. The patient's level of consciousness and vital signs were monitored continuously by radiology nursing throughout the procedure under my direct supervision. CONTRAST:  10 cc-administered into the collecting system(s) FLUOROSCOPY TIME:  Fluoroscopy Time: 3 minutes 18 seconds (8 mGy). COMPLICATIONS: None immediate. PROCEDURE: Informed written consent was obtained from the patient after a thorough discussion of the procedural risks, benefits and alternatives. All questions were addressed. Maximal Sterile Barrier Technique was utilized including caps, mask, sterile gowns, sterile gloves, sterile drape, hand hygiene and skin antiseptic. A timeout was performed prior to the initiation of the procedure. Left nephrostomy: Under sterile conditions and local anesthesia, percutaneous needle access performed of the dilated left kidney mid pole calyx. Needle position confirmed with ultrasound. Images obtained for documentation. Guidewire inserted followed by tract dilatation to insert a 10 [place] drain. Retention loop formed the renal pelvis. Images obtained for documentation. Contrast injection confirms position. Catheter secured with Prolene suture and connected to external gravity drainage. Sterile dressing applied. Right nephrostomy: Under sterile conditions and local anesthesia, percutaneous needle access also performed of a dilated right kidney lower pole calyx. Needle position confirmed with ultrasound. Images obtained for documentation. There was return of clear urine. Guidewire inserted followed by tract dilatation to insert a 10 [place] drain. Retention loop formed the renal pelvis. Catheter secured with Prolene suture and connected to external gravity drainage. Sterile dressing applied. No immediate complication. IMPRESSION: Successful bilateral 10 [place] nephrostomies Electronically Signed   By: Jerilynn Mages.  Shick M.D.   On: 01/12/2022 14:48     Anti-infectives: Anti-infectives (From admission, onward)    Start     Dose/Rate Route Frequency Ordered Stop   01/12/22 0600  cefTRIAXone (ROCEPHIN) 2 g in sodium chloride 0.9 % 100 mL IVPB  2 g 200 mL/hr over 30 Minutes Intravenous On call 01/11/22 1427 01/12/22 0845   01/10/22 1400  ceFAZolin (ANCEF) IVPB 2g/100 mL premix        2 g 200 mL/hr over 30 Minutes Intravenous  Once 01/10/22 1345 01/10/22 1445       Assessment/Plan: Metastatic prostate cancer on chemo Concern for a colonic obstruction Pt with no obstructive signs at this time. Ok for [employer] to soft. No need for surgical intervention. Pt likely would benefit from good bowel regimen on DC  Please call back if can be of further assistance    ID - none FEN - Ok for Softs VTE - SCDs/ per primary Foley - none   Mild moderate bilateral hydronephrosis in the setting of AKI - urology following, s/p Perc nephro tubes COPD Atypical chest pain DNR/DNI     Low Medical Decision Making   LOS: 5 days    Ralene Ok 01/14/2022

## 2022-01-14 NOTE — Assessment & Plan Note (Addendum)
-  There was concern for partial bowel obstruction due to malignancy versus severe constipation with significant stool burden.  General surgery consulted, he was managed with n.p.o., NG tube and aggressive bowel regimen.  He has improved, NG tube has been discontinued and currently tolerating a regular diet.  He will be discharged home in stable condition, he is to continue aggressive bowel regimen

## 2022-01-15 LAB — TYPE AND SCREEN
ABO/RH(D): A POS
Antibody Screen: NEGATIVE
Unit division: 0

## 2022-01-15 LAB — METHYLMALONIC ACID, SERUM: Methylmalonic Acid, Quantitative: 342 nmol/L (ref 0–378)

## 2022-01-15 LAB — BPAM RBC
Blood Product Expiration Date: 202302032359
ISSUE DATE / TIME: 202301181238
Unit Type and Rh: 6200

## 2022-01-15 LAB — CBC
HCT: 24.9 % — ABNORMAL LOW (ref 39.0–52.0)
Hemoglobin: 8.4 g/dL — ABNORMAL LOW (ref 13.0–17.0)
MCH: 30.5 pg (ref 26.0–34.0)
MCHC: 33.7 g/dL (ref 30.0–36.0)
MCV: 90.5 fL (ref 80.0–100.0)
Platelets: 354 10*3/uL (ref 150–400)
RBC: 2.75 MIL/uL — ABNORMAL LOW (ref 4.22–5.81)
RDW: 19.7 % — ABNORMAL HIGH (ref 11.5–15.5)
WBC: 10.6 10*3/uL — ABNORMAL HIGH (ref 4.0–10.5)
nRBC: 2.4 % — ABNORMAL HIGH (ref 0.0–0.2)

## 2022-01-15 LAB — BASIC METABOLIC PANEL
Anion gap: 7 (ref 5–15)
BUN: 11 mg/dL (ref 8–23)
CO2: 21 mmol/L — ABNORMAL LOW (ref 22–32)
Calcium: 7.5 mg/dL — ABNORMAL LOW (ref 8.9–10.3)
Chloride: 106 mmol/L (ref 98–111)
Creatinine, Ser: 0.76 mg/dL (ref 0.61–1.24)
GFR, Estimated: >60 mL/min (ref >60)
Glucose, Bld: 91 mg/dL (ref 70–99)
Potassium: 3.8 mmol/L (ref 3.5–5.1)
Sodium: 134 mmol/L — ABNORMAL LOW (ref 135–145)

## 2022-01-15 LAB — GLUCOSE, CAPILLARY: Glucose-Capillary: 138 mg/dL — ABNORMAL HIGH (ref 70–99)

## 2022-01-15 MED ORDER — POLYETHYLENE GLYCOL 3350 17 G PO PACK: 17.0000 g | PACK | Freq: Two times a day (BID) | ORAL | 0 refills | Status: AC

## 2022-01-15 MED ORDER — DOCUSATE SODIUM 100 MG PO CAPS: 100.0000 mg | ORAL_CAPSULE | Freq: Two times a day (BID) | ORAL | 0 refills | Status: AC

## 2022-01-15 NOTE — Progress Notes (Signed)
Patient is s/p bilat PCN placement with IR on 01/12/22.   UOP and RF improved, last seen by IR APP on 01/13/22, no gross hematuria noted.  Hgb has been stable.  Further bilateral PCN management per urology, IR will sign off.   Routine PCNs exchange in 8-12 weeks, order placed.  Please call IR for questions and concerns.  Armando Gang Shalom Ware PA-C 01/15/2022 8:38 AM

## 2022-01-15 NOTE — Progress Notes (Signed)
Physical Therapy Treatment Patient Details Name: Jose Wolfe MRN: [id number] DOB: 09-25-1946 Today's Date: 01/15/2022   History of Present Illness 76 y.o. male, who is admitted to [facility] on 01/09/2022 with obstructive uropathy and acute kidney injury after presenting from home to [facility] ED complaining of chest pain; also with large bowel obstruction; bil nephrostomy tubes 1/16;  with medical history significant for metastatic prostate cancer undergoing chemotherapy, COPD    PT Comments    Lengthy session with pt today to both walk and try steps, has a lot of personal concerns and questions which required extra time to get to the therapy movement.  Pt is expecting to leave today, reviewed safety of transfers and discouraged him from trying to walk alone due to his impulsive moments and tendency to lean away from the walker.  Encourage OOB to chair and will expect him to require HHPT as planned, and has rollator and 3 in one ordered and in the room for home.  Follow along with him to walk and strengthen as his stay requires.   Recommendations for follow up therapy are one component of a multi-disciplinary discharge planning process, led by the attending physician.  Recommendations may be updated based on patient status, additional functional criteria and insurance authorization.  Follow Up Recommendations  Home health PT     Assistance Recommended at Discharge Intermittent Supervision/Assistance  Patient can return home with the following Assistance with cooking/housework;Help with stairs or ramp for entrance   Equipment Recommendations  Rollator (4 wheels);BSC/3in1    Recommendations for Other Services       Precautions / Restrictions Precautions Precautions: Fall Precaution Comments: monitor orthostatics until they prove to be stable Restrictions Weight Bearing Restrictions: No     Mobility  Bed Mobility Overal bed mobility: Needs Assistance Bed Mobility:  Supine to Sit     Supine to sit: Min guard          Transfers Overall transfer level: Needs assistance Equipment used: Rolling walker (2 wheels), 1 person hand held assist Transfers: Sit to/from Stand Sit to Stand: Min assist                Ambulation/Gait Ambulation/Gait assistance: Min guard Gait Distance (Feet): 25 Feet Assistive device: Rolling walker (2 wheels) Gait Pattern/deviations: Step-through pattern, Trunk flexed Gait velocity: reduced Gait velocity interpretation: <1.31 ft/sec, indicative of household ambulator Pre-gait activities: standing wgt shift General Gait Details: cued direction of walker as he moves to set up transition to sit   Stairs Stairs: Yes Stairs assistance: Min guard Stair Management: Two rails, Forwards, Alternating pattern Number of Stairs: 15 (both up and down) General stair comments: slow but steady effort and watching lines   Wheelchair Mobility    Modified Rankin (Stroke Patients Only)       Balance Overall balance assessment: Needs assistance   Sitting balance-Leahy Scale: Fair Sitting balance - Comments: pt is not very aware of his lines, requires help to manage them   Standing balance support: Bilateral upper extremity supported Standing balance-Leahy Scale: Fair Standing balance comment: fair on RW                            Cognition Arousal/Alertness: Awake/alert Behavior During Therapy: WFL for tasks assessed/performed Overall Cognitive Status: Within Functional Limits for tasks assessed  Exercises      General Comments General comments (skin integrity, edema, etc.): Pt is demonstrating a limited ability to walk due to fatigue from stairs, but is going home with family and will have assistance to move      Pertinent Vitals/Pain Pain Assessment Pain Assessment: 0-10 Pain Score: 3  Pain Location: Discomfort at R nephrostomy tube  site Pain Descriptors / Indicators: Discomfort    Home Living                          Prior Function            PT Goals (current goals can now be found in the care plan section) Acute Rehab PT Goals Patient Stated Goal: to go home today Progress towards PT goals: Progressing toward goals    Frequency    Min 3X/week      PT Plan Current plan remains appropriate    Co-evaluation              AM-PAC PT "6 Clicks" Mobility   Outcome Measure  Help needed turning from your back to your side while in a flat bed without using bedrails?: None Help needed moving from lying on your back to sitting on the side of a flat bed without using bedrails?: A Little Help needed moving to and from a bed to a chair (including a wheelchair)?: A Little Help needed standing up from a chair using your arms (e.g., wheelchair or bedside chair)?: A Little Help needed to walk in hospital room?: A Little Help needed climbing 3-5 steps with a railing? : A Little 6 Click Score: 19    End of Session Equipment Utilized During Treatment: Gait belt Activity Tolerance: Patient tolerated treatment well Patient left: in chair;with call bell/phone within reach Nurse Communication: Mobility status PT Visit Diagnosis: Unsteadiness on feet (R26.81);Other abnormalities of gait and mobility (R26.89)     Time: 4825-0037 PT Time Calculation (min) (ACUTE ONLY): 61 min  Charges:  $Gait Training: 23-37 mins $Therapeutic Activity: 23-37 mins          Ramond Dial 01/15/2022, 2:28 PM  Mee Hives, PT PhD Acute Rehab Dept. Number: [place] and [place]

## 2022-01-15 NOTE — TOC Transition Note (Signed)
Transition of Care [facility]) - CM/SW Discharge Note   Patient Details  Name: Jose Wolfe MRN: [id number] Date of Birth: [birth year]  [facility] CM/SW Contact:  Sharin Mons, RN Phone Number: 01/15/2022, 2:56 PM   Clinical Narrative:    Patient will DC to: Home  Anticipated DC date: 01/15/2022 Family notified: yes Transport by: car  Per MD patient ready for DC today. RN, patient, patient's wife, and Levada Dy / [facility] notified of DC.  Post hospital follow up noted on AVS. Pt without Rx medication concerns.  Transportation to home will be provided by wife.  RNCM will sign off for now as intervention is no longer needed. Please consult [place] again if new needs arise.    Final next level of care: [place] Barriers to Discharge: No Barriers Identified   Patient Goals and CMS Choice        Discharge Placement                       Discharge Plan and Services                DME Arranged: 3-N-1, Walker rolling   Date DME Agency Contacted: 01/14/22 Time DME Agency Contacted: [id number] Representative spoke with at DME Agency: [place] ([place]) Interventions     Readmission Risk Interventions No flowsheet data found.

## 2022-01-15 NOTE — Assessment & Plan Note (Signed)
-   Received Granix while hospitalized per oncology, white count improving upon discharge.

## 2022-01-15 NOTE — Discharge Summary (Signed)
Physician Discharge Summary  Jose Wolfe YOV:[id number] DOB: [birth year] DOA: 01/09/2022  PCP: [name], MD  Admit date: 01/09/2022 Discharge date: 01/15/2022  Admitted From: home Disposition:  home  Recommendations for Outpatient Follow-up:  Follow up with PCP in 1-2 weeks Follow-up with urology, IR, oncology  Home Health: PT Equipment/Devices: None  Discharge Condition: stable CODE STATUS: DNR Diet recommendation: regular  HPI: Brief narrative Jose Wolfe is a 76 y.o. male with medical history significant for metastatic prostate cancer undergoing chemotherapy, COPD, who is admitted to [facility] on 01/09/2022 with obstructive uropathy and acute kidney injury after presenting from home to [facility] ED complaining of chest pain.  CTA chest negative for PE, but work-up significant for bilateral hydronephrosis with worsening renal function, requiring bilateral nephrostomy tubes, and questionable colonic obstruction, unclear due to to severe constipation versus carcinomatosis  Hospital Course / Discharge diagnoses: * Large bowel obstruction ([place])- (present on admission) -There was concern for partial bowel obstruction due to malignancy versus severe constipation with significant stool burden.  General surgery consulted, he was managed with n.p.o., NG tube and aggressive bowel regimen.  He has improved, NG tube has been discontinued and currently tolerating a regular diet.  He will be discharged home in stable condition, he is to continue aggressive bowel regimen  AKI (acute kidney injury) ([place]) due to bilateral obstructive uropathy- (present on admission) -CT imaging on admission showed moderate bilateral hydronephrosis secondary to probable malignant infiltration of the distal right ureter and extrinsic compression of the distal left ureter secondary to perirectal soft tissue.  Urology consulted, recommending bilateral nephrostomy tubes which were placed  on 1/16 by IR. He has remained stable, renal function has normalized and will be discharged home in stable condition with Urology and IR follow up  Normocytic anemia- (present on admission) -Due to chronic illness, metastatic disease, chemotherapy.  Hemoglobin trending down to 7.1 at 1 point, no bleeding was noted.  He received a unit of packed red blood cells with adequate response.  Outpatient follow-up with oncology  Leukopenia due to antineoplastic chemotherapy ([place]) - Received Granix while hospitalized per oncology, white count improving upon discharge.  Atypical chest pain- (present on admission) -Atypical, due to osseous metastatic disease, CT angiogram negative for PE, EKG nonacute, troponin negative x2  Hyperkalemia- (present on admission) -Due to acute kidney injury, resolved with Lokelma  COPD (chronic obstructive pulmonary disease) ([place])- (present on admission) -Stable, no wheezing  Metastatic castration-resistant adenocarcinoma of prostate ([place])- (present on admission) -Outpatient follow-up with primary oncologist, getting chemotherapy    Sepsis ruled out   Discharge Instructions   Allergies as of 01/15/2022       Reactions   Atorvastatin    Other reaction(s): Myalgias        Medication List     STOP taking these medications    metoprolol tartrate 50 MG tablet Commonly known as: LOPRESSOR   predniSONE 5 MG tablet Commonly known as: DELTASONE       TAKE these medications    betamethasone dipropionate 0.05 % cream Apply 1 application topically 2 (two) times daily as needed (for psoriasis).   budesonide-formoterol 160-4.5 MCG/ACT inhaler Commonly known as: SYMBICORT Inhale 2 puffs into the lungs 2 (two) times daily.   calcium-vitamin D 500-200 MG-UNIT tablet Commonly known as: OSCAL WITH D Take 1 tablet by mouth.   docusate sodium 100 MG capsule Commonly known as: COLACE Take 1 capsule (100 mg total) by mouth 2 (two) times daily.  guaiFENesin 600 MG 12 hr tablet Commonly known as: MUCINEX Take 600 mg by mouth 2 (two) times daily as needed for cough.   leuprolide acetate (6 Month) 45 MG injection Generic drug: leuprolide (6 Month) See admin instructions.   lidocaine-prilocaine cream Commonly known as: EMLA Apply 1 application topically as needed. What changed: reasons to take this   megestrol 625 MG/5ML suspension Commonly known as: MEGACE ES Take 5 mLs (625 mg total) by mouth daily.   polyethylene glycol 17 g packet Commonly known as: MIRALAX / GLYCOLAX Take 17 g by mouth 2 (two) times daily.   prochlorperazine 10 MG tablet Commonly known as: COMPAZINE Take 1 tablet (10 mg total) by mouth every 6 (six) hours as needed for nausea or vomiting.   pseudoephedrine-acetaminophen 30-500 MG Tabs tablet Commonly known as: TYLENOL SINUS Take 1 tablet by mouth every 4 (four) hours as needed (sinus congestion).   senna-docusate 8.6-50 MG tablet Commonly known as: Senna S Take 1 tablet by mouth at bedtime as needed for mild constipation.   triamcinolone cream 0.1 % Commonly known as: KENALOG Apply 1 application topically 2 (two) times daily as needed (for psoriasis).   Ventolin HFA 108 (90 Base) MCG/ACT inhaler Generic drug: albuterol INHALE 1 PUFF BY MOUTH EVERY 4 HOURS AS NEEDED   VITAMIN B 12 PO Take 1 tablet by mouth daily.   Vitamin B Complex Tabs Take 1 tablet by mouth daily.               Durable Medical Equipment  (From admission, onward)           Start     Ordered   01/14/22 1055  For home use only DME 3 n 1  Once        01/14/22 1054   01/14/22 1054  For home use only DME Walker rolling  Once       Question Answer Comment  Walker: With [place] Wheels   Patient needs a walker to treat with the following condition Weakness      01/14/22 1054            Follow-up Information     [place], [place] Follow up.   Specialty: [place] Why: the  office now goes by [facility] - someone will call to schedule home health visits Contact information: [place] [phone]                 Consultations: Oncology Urology IR General surgery   Procedures/Studies:  CT ABDOMEN PELVIS WO CONTRAST  Result Date: 01/09/2022 CLINICAL DATA:  Abdominal pain, acute, nonlocalized evaluate for obstruction. bilateral hydronephrosis on CTA. EXAM: CT ABDOMEN AND PELVIS WITHOUT CONTRAST TECHNIQUE: Multidetector CT imaging of the abdomen and pelvis was performed following the standard protocol without IV contrast. RADIATION DOSE REDUCTION: This exam was performed according to the departmental dose-optimization program which includes automated exposure control, adjustment of the mA and/or kV according to patient size and/or use of iterative reconstruction technique. COMPARISON:  10/04/2019 FINDINGS: Lower chest: No acute abnormality. Moderate coronary artery calcification. Small hiatal hernia. Hepatobiliary: No focal liver abnormality is seen. No gallstones, gallbladder wall thickening, or biliary dilatation. Pancreas: Unremarkable Spleen: Unremarkable Adrenals/Urinary Tract: Dense calcification within the right adrenal gland is in keeping with prior infarction and/or hemorrhage. Left adrenal gland is unremarkable. The kidneys are normal in size and position. There is moderate bilateral hydronephrosis, new from prior PET CT examination of 09/19/2021.  Left ureteral distension extends to the level of the a lobulated mass within the left hemipelvis, best seen on image # 60/4. Right ureteral obstruction extends to the level of the distal right ureter just proximal to the right ureterovesicular junction. A discrete obstructing mass is not clearly identified in this location. There is, however, asymmetric bladder wall thickening in the region of the right ureterovesicular junction and circumferential thickening of the distal  right ureter which may reflect malignant infiltration in this location, best seen on coronal image # 57-58/7. Additionally, there is a lobulated mural mass within the dome of the bladder measuring roughly 3.9 x 2.8 x 6.3 cm in greatest dimension, best seen on axial image # 70/4. Stomach/Bowel: The transverse colon is redundant. There is an internal hernia present with multiple loops of small bowel extending above the mid transverse colon into the right anterior abdomen. This results in a partial obstruction of the transverse colon with large volume stool proximal to the point of obstruction secondary to mass effect. This is best seen on axial image # 39/4 and coronal image # 23-27. The stomach and small bowel are unremarkable. The appendix is not clearly identified and may be absent. No free intraperitoneal gas or fluid. Vascular/Lymphatic: As noted above, there is a soft tissue mass within the left perirectal soft tissues measuring 2.7 x 6.1 cm on axial image # 65/4 possibly representing a nodal conglomerate given the findings within the bladder. There is, additionally, pathologic right retrocrural, left periaortic, right pericaval, left pelvic sidewall and right external iliac lymphadenopathy with the index lymph node measuring 3.2 x 3.5 cm at axial image # 69/4. Moderate aortoiliac atherosclerotic calcification. No aortic aneurysm. Reproductive: Brachytherapy seeds are seen within the prostate gland. Other: No abdominal wall hernia. Musculoskeletal: Widespread sclerotic metastases are seen within the visualized axial skeleton, significantly progressed since prior examination. No pathologic fracture identified. IMPRESSION: Interval development of a moderate bilateral hydronephrosis secondary to probable malignant infiltration of the distal right ureter and extrinsic compression of the distal left ureter secondary to perirectal soft tissue possibly representing a metastatic nodal conglomerate. Interval development  of asymmetric wall thickening with a superimposed intraluminal mural mass within the bladder suspicious for a primary bladder malignancy. This would be better assessed with cystoscopy. Less likely, this may represent a metastatic mass related to the patient's underlying prostate malignancy. Widespread retroperitoneal metastatic adenopathy, progressive since prior PET CT examination. This can be seen both in the setting of metastatic bladder and metastatic prostate cancer, however, its presence on prior PET CT examination suggests that this may reflect progressive disease related to the patient's metastatic prostate cancer. Right external iliac pathologic lymphadenopathy would be amenable to percutaneous ultrasound-guided biopsy if clinically indicated. Widespread osteoblastic metastases, significantly progressed since prior examination, most in keeping with progressive metastatic prostate cancer. Aortic Atherosclerosis (ICD10-I70.0). Electronically Signed   By: Fidela Salisbury M.D.   On: 01/09/2022 17:02   CT Head Wo Contrast  Result Date: 01/09/2022 CLINICAL DATA:  Headache, prostate cancer currently undergoing chemotherapy, began new medication with generalized feeling of unwellness, episode of unresponsiveness and right gaze, apnea EXAM: CT HEAD WITHOUT CONTRAST TECHNIQUE: Contiguous axial images were obtained from the base of the skull through the vertex without intravenous contrast. RADIATION DOSE REDUCTION: This exam was performed according to the departmental dose-optimization program which includes automated exposure control, adjustment of the mA and/or kV according to patient size and/or use of iterative reconstruction technique. COMPARISON:  None. FINDINGS: Brain: No acute infarct or hemorrhage.  Lateral ventricles and midline structures are unremarkable. No acute extra-axial fluid collections. No mass effect. Vascular: No hyperdense vessel or unexpected calcification. Skull: Normal. Negative for  fracture or focal lesion. Sinuses/Orbits: Partial opacification of the ethmoid air cells. Remaining paranasal sinuses are clear. Other: None. IMPRESSION: 1. No acute intracranial process. Electronically Signed   By: Randa Ngo M.D.   On: 01/09/2022 15:41   CT Angio Chest PE W and/or Wo Contrast  Result Date: 01/09/2022 CLINICAL DATA:  Shortness of breath, chest pain. History of metastatic prostate cancer. EXAM: CT ANGIOGRAPHY CHEST WITH CONTRAST TECHNIQUE: Multidetector CT imaging of the chest was performed using the standard protocol during bolus administration of intravenous contrast. Multiplanar CT image reconstructions and MIPs were obtained to evaluate the vascular anatomy. RADIATION DOSE REDUCTION: This exam was performed according to the departmental dose-optimization program which includes automated exposure control, adjustment of the mA and/or kV according to patient size and/or use of iterative reconstruction technique. CONTRAST:  35mL OMNIPAQUE IOHEXOL 350 MG/ML SOLN COMPARISON:  July 29, 2006. FINDINGS: Cardiovascular: Satisfactory opacification of the pulmonary arteries to the segmental level. No evidence of pulmonary embolism. Normal heart size. No pericardial effusion. Atherosclerosis of thoracic aorta is noted. Mediastinum/Nodes: Small sliding-type hiatal hernia. No adenopathy is noted. Thyroid gland is unremarkable. Lungs/Pleura: Lungs are clear. No pleural effusion or pneumothorax. Upper Abdomen: There appears to be mild bilateral hydronephrosis. Musculoskeletal: Sclerotic densities are noted throughout the spine and ribs consistent with osseous metastatic disease. Review of the MIP images confirms the above findings. IMPRESSION: No definite evidence of pulmonary embolus. Continued presence of diffuse osseous metastases. Probable bilateral hydronephrosis is seen in the visualized portion of the upper abdomen. Small sliding-type hiatal hernia. Aortic Atherosclerosis (ICD10-I70.0).  Electronically Signed   By: Marijo Conception M.D.   On: 01/09/2022 15:42   DG Chest Portable 1 View  Result Date: 01/09/2022 CLINICAL DATA:  Chest pain. EXAM: PORTABLE CHEST 1 VIEW COMPARISON:  December 23, 2018. FINDINGS: The heart size and mediastinal contours are within normal limits. Right internal jugular Port-A-Cath is noted with distal tip in expected position of the SVC. Mild bibasilar subsegmental atelectasis is noted. The visualized skeletal structures are unremarkable. IMPRESSION: Mild bibasilar subsegmental atelectasis. Electronically Signed   By: Marijo Conception M.D.   On: 01/09/2022 15:01   DG Abd Portable 1V  Result Date: 01/13/2022 CLINICAL DATA:  Follow-up small bowel obstruction. EXAM: PORTABLE ABDOMEN - 1 VIEW COMPARISON:  KUB 01/11/2022 CT abdomen and pelvis 01/09/2022 FINDINGS: An enteric tube is seen with tip overlying the expected region of the stomach and left upper quadrant. The side port overlies the expected region of the distal esophagus/gastroesophageal junction. There are bilateral renal pigtail catheters, likely bilateral external nephrostomy tubes. Numerous metallic densities overlie the prostate. There are vascular phleboliths overlying the pelvis. Stool is seen throughout the proximal to mid colon. There appears to be air within nondistended descending colon. Note is made that an internal hernia of small bowel extending into the right upper quadrant was seen contributing to partial obstruction of the transverse colon on prior CT. There are some small bowel loops measuring up to 3.5 cm within the right upper quadrant compatible with obstruction extending retrograde to the small bowel. No portal venous gas or pneumatosis is seen. The lung bases are clear. No acute skeletal abnormality. IMPRESSION:: IMPRESSION: 1. There is again high-grade stool throughout the proximal colon. Note is made on prior CT of partial obstruction of the transverse colon associated with an internal  hernia of small bowel. There now is mild dilatation of a loop of small bowel within the right upper quadrant, likely related to this partial bowel obstruction. 2. Tip of enteric tube overlies the stomach. Electronically Signed   By: Yvonne Kendall M.D.   On: 01/13/2022 08:58   DG Abd Portable 1V  Result Date: 01/11/2022 CLINICAL DATA:  Nasogastric tube placement EXAM: PORTABLE ABDOMEN - 1 VIEW COMPARISON:  Two days ago FINDINGS: The enteric tube tip is at the proximal stomach with side port over the lower esophagus. Artifact from EKG leads. Extensive stool seen in the upper abdomen.  No dilated small bowel IMPRESSION: Enteric tube with tip at the upper stomach and side port at the lower esophagus. Diffuse colonic stool. Electronically Signed   By: Jorje Guild M.D.   On: 01/11/2022 07:23   IR NEPHROSTOMY PLACEMENT LEFT  Result Date: 01/12/2022 INDICATION: Bilateral hydronephrosis EXAM: ULTRASOUND AND FLUOROSCOPIC 10 FRENCH BILATERAL NEPHROSTOMIES COMPARISON:  01/09/2022 MEDICATIONS: 1% lidocaine local ANESTHESIA/SEDATION: Moderate (conscious) sedation was employed during this procedure. A total of Versed 1.5 mg and Fentanyl 70 mcg was administered intravenously by the radiology nurse. Total intra-service moderate Sedation Time: 19 minutes. The patient's level of consciousness and vital signs were monitored continuously by radiology nursing throughout the procedure under my direct supervision. CONTRAST:  10 cc-administered into the collecting system(s) FLUOROSCOPY TIME:  Fluoroscopy Time: 3 minutes 18 seconds (8 mGy). COMPLICATIONS: None immediate. PROCEDURE: Informed written consent was obtained from the patient after a thorough discussion of the procedural risks, benefits and alternatives. All questions were addressed. Maximal Sterile Barrier Technique was utilized including caps, mask, sterile gowns, sterile gloves, sterile drape, hand hygiene and skin antiseptic. A timeout was performed prior to the  initiation of the procedure. Left nephrostomy: Under sterile conditions and local anesthesia, percutaneous needle access performed of the dilated left kidney mid pole calyx. Needle position confirmed with ultrasound. Images obtained for documentation. Guidewire inserted followed by tract dilatation to insert a 10 [place] drain. Retention loop formed the renal pelvis. Images obtained for documentation. Contrast injection confirms position. Catheter secured with Prolene suture and connected to external gravity drainage. Sterile dressing applied. Right nephrostomy: Under sterile conditions and local anesthesia, percutaneous needle access also performed of a dilated right kidney lower pole calyx. Needle position confirmed with ultrasound. Images obtained for documentation. There was return of clear urine. Guidewire inserted followed by tract dilatation to insert a 10 [place] drain. Retention loop formed the renal pelvis. Catheter secured with Prolene suture and connected to external gravity drainage. Sterile dressing applied. No immediate complication. IMPRESSION: Successful bilateral 10 [place] nephrostomies Electronically Signed   By: Jerilynn Mages.  Shick M.D.   On: 01/12/2022 14:48   IR NEPHROSTOMY PLACEMENT RIGHT  Result Date: 01/12/2022 INDICATION: Bilateral hydronephrosis EXAM: ULTRASOUND AND FLUOROSCOPIC 10 FRENCH BILATERAL NEPHROSTOMIES COMPARISON:  01/09/2022 MEDICATIONS: 1% lidocaine local ANESTHESIA/SEDATION: Moderate (conscious) sedation was employed during this procedure. A total of Versed 1.5 mg and Fentanyl 70 mcg was administered intravenously by the radiology nurse. Total intra-service moderate Sedation Time: 19 minutes. The patient's level of consciousness and vital signs were monitored continuously by radiology nursing throughout the procedure under my direct supervision. CONTRAST:  10 cc-administered into the collecting system(s) FLUOROSCOPY TIME:  Fluoroscopy Time: 3 minutes 18 seconds (8 mGy). COMPLICATIONS:  None immediate. PROCEDURE: Informed written consent was obtained from the patient after a thorough discussion of the procedural risks, benefits and alternatives. All questions were addressed. Maximal Sterile Barrier Technique was utilized including caps, mask,  sterile gowns, sterile gloves, sterile drape, hand hygiene and skin antiseptic. A timeout was performed prior to the initiation of the procedure. Left nephrostomy: Under sterile conditions and local anesthesia, percutaneous needle access performed of the dilated left kidney mid pole calyx. Needle position confirmed with ultrasound. Images obtained for documentation. Guidewire inserted followed by tract dilatation to insert a 10 [place] drain. Retention loop formed the renal pelvis. Images obtained for documentation. Contrast injection confirms position. Catheter secured with Prolene suture and connected to external gravity drainage. Sterile dressing applied. Right nephrostomy: Under sterile conditions and local anesthesia, percutaneous needle access also performed of a dilated right kidney lower pole calyx. Needle position confirmed with ultrasound. Images obtained for documentation. There was return of clear urine. Guidewire inserted followed by tract dilatation to insert a 10 [place] drain. Retention loop formed the renal pelvis. Catheter secured with Prolene suture and connected to external gravity drainage. Sterile dressing applied. No immediate complication. IMPRESSION: Successful bilateral 10 [place] nephrostomies Electronically Signed   By: Jerilynn Mages.  Shick M.D.   On: 01/12/2022 14:48     Subjective: - no chest pain, shortness of breath, no abdominal pain, nausea or vomiting.   Discharge Exam: BP 115/75 (BP Location: Right Arm)    Pulse 88    Temp 98.2 F (36.8 C) (Oral)    Resp 18    Ht 6' (1.829 m)    Wt 40.7 kg    SpO2 100%    BMI 12.17 kg/m   General: Pt is alert, awake, not in acute distress Cardiovascular: RRR, S1/S2 +, no rubs, no  gallops Respiratory: CTA bilaterally, no wheezing, no rhonchi Abdominal: Soft, NT, ND, bowel sounds + Extremities: no edema, no cyanosis    The results of significant diagnostics from this hospitalization (including imaging, microbiology, ancillary and laboratory) are listed below for reference.     Microbiology: Recent Results (from the past 240 hour(s))  Resp Panel by RT-PCR (Flu A&B, Covid) Nasopharyngeal Swab     Status: None   Collection Time: 01/09/22  2:46 PM   Specimen: Nasopharyngeal Swab; Nasopharyngeal(NP) swabs in vial transport medium  Result Value Ref Range Status   SARS Coronavirus 2 by RT PCR NEGATIVE NEGATIVE Final    Comment: (NOTE) SARS-CoV-2 target nucleic acids are NOT DETECTED.  The SARS-CoV-2 RNA is generally detectable in upper respiratory specimens during the acute phase of infection. The lowest concentration of SARS-CoV-2 viral copies this assay can detect is 138 copies/mL. A negative result does not preclude SARS-Cov-2 infection and should not be used as the sole basis for treatment or other patient management decisions. A negative result may occur with  improper specimen collection/handling, submission of specimen other than nasopharyngeal swab, presence of viral mutation(s) within the areas targeted by this assay, and inadequate number of viral copies(<138 copies/mL). A negative result must be combined with clinical observations, patient history, and epidemiological information. The expected result is Negative.  Fact Sheet for Patients:  EntrepreneurPulse.com.au  Fact Sheet for Healthcare Providers:  IncredibleEmployment.be  This test is no t yet approved or cleared by the [place] FDA and  has been authorized for detection and/or diagnosis of SARS-CoV-2 by FDA under an Emergency Use Authorization (EUA). This EUA will remain  in effect (meaning this test can be used) for the duration of the COVID-19  declaration under Section 564(b)(1) of the Act, 21 U.S.C.section 360bbb-3(b)(1), unless the authorization is terminated  or revoked sooner.       Influenza A by PCR NEGATIVE NEGATIVE Final  Influenza B by PCR NEGATIVE NEGATIVE Final    Comment: (NOTE) The Xpert Xpress SARS-CoV-2/FLU/RSV plus assay is intended as an aid in the diagnosis of influenza from Nasopharyngeal swab specimens and should not be used as a sole basis for treatment. Nasal washings and aspirates are unacceptable for Xpert Xpress SARS-CoV-2/FLU/RSV testing.  Fact Sheet for Patients: EntrepreneurPulse.com.au  Fact Sheet for Healthcare Providers: IncredibleEmployment.be  This test is not yet approved or cleared by the [place] FDA and has been authorized for detection and/or diagnosis of SARS-CoV-2 by FDA under an Emergency Use Authorization (EUA). This EUA will remain in effect (meaning this test can be used) for the duration of the COVID-19 declaration under Section 564(b)(1) of the Act, 21 U.S.C. section 360bbb-3(b)(1), unless the authorization is terminated or revoked.  Performed at [facility], [place] [address], [place], [place] [address]   Surgical pcr screen     Status: None   Collection Time: 01/12/22  4:36 AM   Specimen: Nasal Mucosa; Nasal Swab  Result Value Ref Range Status   MRSA, PCR NEGATIVE NEGATIVE Final   Staphylococcus aureus NEGATIVE NEGATIVE Final    Comment: (NOTE) The Xpert SA Assay (FDA approved for NASAL specimens in patients 44 years of age and older), is one component of a comprehensive surveillance program. It is not intended to diagnose infection nor to guide or monitor treatment. Performed at [facility], [place] [address] [place],  [address]      Labs: Basic Metabolic Panel: Recent Labs  Lab 01/10/22 0304 01/11/22 0244 01/12/22 0128 01/13/22 0353 01/14/22 0211 01/15/22 0334  NA 134* 131* 137 137 137 134*   K 5.2* 4.6 4.3 3.4* 4.0 3.8  CL 103 103 105 107 109 106  CO2 21* 20* 20* 23 22 21*  GLUCOSE 98 107* 107* 114* 96 91  BUN 27* 23 26* 16 9 11   CREATININE 1.59* 1.63* 1.50* 0.91 0.77 0.76  CALCIUM 8.3* 7.6* 7.6* 7.3* 7.3* 7.5*  MG 1.8   1.8  --   --   --   --   --   PHOS 4.7*  --   --   --   --   --    Liver Function Tests: Recent Labs  Lab 01/09/22 1540 01/10/22 0304  AST 98* 128*  ALT 15 16  ALKPHOS 54 77  BILITOT 0.8 0.6  PROT 6.0* 5.8*  ALBUMIN 3.1* 2.9*   CBC: Recent Labs  Lab 01/09/22 1540 01/10/22 0304 01/11/22 0244 01/12/22 0128 01/13/22 0353 01/14/22 0211 01/15/22 0334  WBC 7.0 5.2 6.5 4.8 3.2* 2.9* 10.6*  NEUTROABS 6.6 4.7  --   --   --   --   --   HGB 8.2* 8.1* 8.3* 8.5* 7.4* 7.1* 8.4*  HCT 25.7* 25.5* 24.2* 25.1* 23.1* 21.7* 24.9*  MCV 93.5 93.4 89.6 91.3 93.5 93.1 90.5  PLT 247 230 232 303 306 335 354   CBG: Recent Labs  Lab 01/11/22 2031 01/15/22 0833  GLUCAP 105* 138*   Hgb A1c No results for input(s): HGBA1C in the last 72 hours. Lipid Profile No results for input(s): CHOL, HDL, LDLCALC, TRIG, CHOLHDL, LDLDIRECT in the last 72 hours. Thyroid function studies No results for input(s): TSH, T4TOTAL, T3FREE, THYROIDAB in the last 72 hours.  Invalid input(s): FREET3 Urinalysis    Component Value Date/Time   COLORURINE YELLOW 01/10/2022 [place] 01/10/2022 1140   LABSPEC 1.015 01/10/2022 1140   PHURINE 6.5 01/10/2022 1140  GLUCOSEU NEGATIVE 01/10/2022 1140   HGBUR LARGE (A) 01/10/2022 1140   BILIRUBINUR NEGATIVE 01/10/2022 1140   KETONESUR NEGATIVE 01/10/2022 1140   PROTEINUR 30 (A) 01/10/2022 1140   NITRITE NEGATIVE 01/10/2022 1140   LEUKOCYTESUR NEGATIVE 01/10/2022 1140    FURTHER DISCHARGE INSTRUCTIONS:   Get Medicines reviewed and adjusted: Please take all your medications with you for your next visit with your Primary MD   Laboratory/radiological data: Please request your Primary MD to go over all hospital  tests and procedure/radiological results at the follow up, please ask your Primary MD to get all Hospital records sent to his/her office.   In some cases, they will be blood work, cultures and biopsy results pending at the time of your discharge. Please request that your primary care M.D. goes through all the records of your hospital data and follows up on these results.   Also Note the following: If you experience worsening of your admission symptoms, develop shortness of breath, life threatening emergency, suicidal or homicidal thoughts you must seek medical attention immediately by calling 911 or calling your MD immediately  if symptoms less severe.   You must read complete instructions/literature along with all the possible adverse reactions/side effects for all the Medicines you take and that have been prescribed to you. Take any new Medicines after you have completely understood and accpet all the possible adverse reactions/side effects.    Do not drive when taking Pain medications or sleeping medications (Benzodaizepines)   Do not take more than prescribed Pain, Sleep and Anxiety Medications. It is not advisable to combine anxiety,sleep and pain medications without talking with your primary care practitioner   Special Instructions: If you have smoked or chewed Tobacco  in the last 2 yrs please stop smoking, stop any regular Alcohol  and or any Recreational drug use.   Wear Seat belts while driving.   Please note: You were cared for by a hospitalist during your hospital stay. Once you are discharged, your primary care physician will handle any further medical issues. Please note that NO REFILLS for any discharge medications will be authorized once you are discharged, as it is imperative that you return to your primary care physician (or establish a relationship with a primary care physician if you do not have one) for your post hospital discharge needs so that they can reassess your need for  medications and monitor your lab values.  Time coordinating discharge: 45 minutes  SIGNED:  Marzetta Board, MD, PhD 01/15/2022, 1:40 PM

## 2022-01-17 DIAGNOSIS — J449 Chronic obstructive pulmonary disease, unspecified: Secondary | ICD-10-CM | POA: Diagnosis not present

## 2022-01-17 DIAGNOSIS — D701 Agranulocytosis secondary to cancer chemotherapy: Secondary | ICD-10-CM | POA: Diagnosis not present

## 2022-01-17 DIAGNOSIS — N179 Acute kidney failure, unspecified: Secondary | ICD-10-CM | POA: Diagnosis not present

## 2022-01-17 DIAGNOSIS — C61 Malignant neoplasm of prostate: Secondary | ICD-10-CM | POA: Diagnosis not present

## 2022-01-17 DIAGNOSIS — N139 Obstructive and reflux uropathy, unspecified: Secondary | ICD-10-CM | POA: Diagnosis not present

## 2022-01-17 DIAGNOSIS — N1330 Unspecified hydronephrosis: Secondary | ICD-10-CM | POA: Diagnosis not present

## 2022-01-17 DIAGNOSIS — K56609 Unspecified intestinal obstruction, unspecified as to partial versus complete obstruction: Secondary | ICD-10-CM | POA: Diagnosis not present

## 2022-01-17 DIAGNOSIS — C7951 Secondary malignant neoplasm of bone: Secondary | ICD-10-CM | POA: Diagnosis not present

## 2022-01-17 DIAGNOSIS — E875 Hyperkalemia: Secondary | ICD-10-CM | POA: Diagnosis not present

## 2022-01-17 DIAGNOSIS — T451X5A Adverse effect of antineoplastic and immunosuppressive drugs, initial encounter: Secondary | ICD-10-CM | POA: Diagnosis not present

## 2022-01-19 DIAGNOSIS — Z192 Hormone resistant malignancy status: Secondary | ICD-10-CM | POA: Diagnosis not present

## 2022-01-19 DIAGNOSIS — D701 Agranulocytosis secondary to cancer chemotherapy: Secondary | ICD-10-CM | POA: Diagnosis not present

## 2022-01-19 DIAGNOSIS — Z95828 Presence of other vascular implants and grafts: Secondary | ICD-10-CM | POA: Diagnosis not present

## 2022-01-19 DIAGNOSIS — Z09 Encounter for follow-up examination after completed treatment for conditions other than malignant neoplasm: Secondary | ICD-10-CM | POA: Diagnosis not present

## 2022-01-19 DIAGNOSIS — T451X5A Adverse effect of antineoplastic and immunosuppressive drugs, initial encounter: Secondary | ICD-10-CM | POA: Diagnosis not present

## 2022-01-19 DIAGNOSIS — E875 Hyperkalemia: Secondary | ICD-10-CM | POA: Diagnosis not present

## 2022-01-19 DIAGNOSIS — C61 Malignant neoplasm of prostate: Secondary | ICD-10-CM | POA: Diagnosis not present

## 2022-01-19 DIAGNOSIS — D649 Anemia, unspecified: Secondary | ICD-10-CM | POA: Diagnosis not present

## 2022-01-21 DIAGNOSIS — C61 Malignant neoplasm of prostate: Secondary | ICD-10-CM | POA: Diagnosis not present

## 2022-01-21 DIAGNOSIS — T451X5A Adverse effect of antineoplastic and immunosuppressive drugs, initial encounter: Secondary | ICD-10-CM | POA: Diagnosis not present

## 2022-01-21 DIAGNOSIS — K56609 Unspecified intestinal obstruction, unspecified as to partial versus complete obstruction: Secondary | ICD-10-CM | POA: Diagnosis not present

## 2022-01-21 DIAGNOSIS — J449 Chronic obstructive pulmonary disease, unspecified: Secondary | ICD-10-CM | POA: Diagnosis not present

## 2022-01-21 DIAGNOSIS — C7951 Secondary malignant neoplasm of bone: Secondary | ICD-10-CM | POA: Diagnosis not present

## 2022-01-21 DIAGNOSIS — E875 Hyperkalemia: Secondary | ICD-10-CM | POA: Diagnosis not present

## 2022-01-21 DIAGNOSIS — N139 Obstructive and reflux uropathy, unspecified: Secondary | ICD-10-CM | POA: Diagnosis not present

## 2022-01-21 DIAGNOSIS — N179 Acute kidney failure, unspecified: Secondary | ICD-10-CM | POA: Diagnosis not present

## 2022-01-21 DIAGNOSIS — N1330 Unspecified hydronephrosis: Secondary | ICD-10-CM | POA: Diagnosis not present

## 2022-01-21 DIAGNOSIS — D701 Agranulocytosis secondary to cancer chemotherapy: Secondary | ICD-10-CM | POA: Diagnosis not present

## 2022-01-23 DIAGNOSIS — N130 Hydronephrosis with ureteropelvic junction obstruction: Secondary | ICD-10-CM | POA: Diagnosis not present

## 2022-01-27 MED FILL — Dexamethasone Sodium Phosphate Inj 100 MG/10ML: INTRAMUSCULAR | Qty: 1 | Status: AC

## 2022-01-28 ENCOUNTER — Inpatient Hospital Stay: Payer: Medicare HMO | Attending: Oncology

## 2022-01-28 ENCOUNTER — Other Ambulatory Visit: Payer: Self-pay

## 2022-01-28 ENCOUNTER — Inpatient Hospital Stay: Payer: Medicare HMO

## 2022-01-28 ENCOUNTER — Inpatient Hospital Stay: Payer: Medicare HMO | Admitting: Oncology

## 2022-01-28 VITALS — BP 113/64 | HR 78 | Temp 98.0°F | Resp 19 | Ht 72.0 in | Wt 155.1 lb

## 2022-01-28 DIAGNOSIS — C61 Malignant neoplasm of prostate: Secondary | ICD-10-CM

## 2022-01-28 DIAGNOSIS — C7951 Secondary malignant neoplasm of bone: Secondary | ICD-10-CM | POA: Diagnosis not present

## 2022-01-28 DIAGNOSIS — D701 Agranulocytosis secondary to cancer chemotherapy: Secondary | ICD-10-CM | POA: Diagnosis not present

## 2022-01-28 DIAGNOSIS — Z95828 Presence of other vascular implants and grafts: Secondary | ICD-10-CM

## 2022-01-28 DIAGNOSIS — N179 Acute kidney failure, unspecified: Secondary | ICD-10-CM | POA: Diagnosis not present

## 2022-01-28 DIAGNOSIS — E875 Hyperkalemia: Secondary | ICD-10-CM | POA: Diagnosis not present

## 2022-01-28 DIAGNOSIS — Z192 Hormone resistant malignancy status: Secondary | ICD-10-CM | POA: Diagnosis not present

## 2022-01-28 DIAGNOSIS — Z5189 Encounter for other specified aftercare: Secondary | ICD-10-CM | POA: Diagnosis not present

## 2022-01-28 DIAGNOSIS — Z5111 Encounter for antineoplastic chemotherapy: Secondary | ICD-10-CM | POA: Insufficient documentation

## 2022-01-28 DIAGNOSIS — K56609 Unspecified intestinal obstruction, unspecified as to partial versus complete obstruction: Secondary | ICD-10-CM | POA: Diagnosis not present

## 2022-01-28 DIAGNOSIS — J449 Chronic obstructive pulmonary disease, unspecified: Secondary | ICD-10-CM | POA: Diagnosis not present

## 2022-01-28 DIAGNOSIS — T451X5A Adverse effect of antineoplastic and immunosuppressive drugs, initial encounter: Secondary | ICD-10-CM | POA: Diagnosis not present

## 2022-01-28 DIAGNOSIS — N139 Obstructive and reflux uropathy, unspecified: Secondary | ICD-10-CM | POA: Diagnosis not present

## 2022-01-28 DIAGNOSIS — D6481 Anemia due to antineoplastic chemotherapy: Secondary | ICD-10-CM | POA: Insufficient documentation

## 2022-01-28 DIAGNOSIS — D630 Anemia in neoplastic disease: Secondary | ICD-10-CM | POA: Diagnosis not present

## 2022-01-28 DIAGNOSIS — N1330 Unspecified hydronephrosis: Secondary | ICD-10-CM | POA: Diagnosis not present

## 2022-01-28 LAB — CMP (CANCER CENTER ONLY)
ALT: 13 U/L (ref 0–44)
AST: 15 U/L (ref 15–41)
Albumin: 3.2 g/dL — ABNORMAL LOW (ref 3.5–5.0)
Alkaline Phosphatase: 91 U/L (ref 38–126)
Anion gap: 8 (ref 5–15)
BUN: 14 mg/dL (ref 8–23)
CO2: 24 mmol/L (ref 22–32)
Calcium: 8.3 mg/dL — ABNORMAL LOW (ref 8.9–10.3)
Chloride: 102 mmol/L (ref 98–111)
Creatinine: 0.73 mg/dL (ref 0.61–1.24)
GFR, Estimated: >60 mL/min (ref >60)
Glucose, Bld: 99 mg/dL (ref 70–99)
Potassium: 4.3 mmol/L (ref 3.5–5.1)
Sodium: 134 mmol/L — ABNORMAL LOW (ref 135–145)
Total Bilirubin: 0.3 mg/dL (ref 0.3–1.2)
Total Protein: 6.4 g/dL — ABNORMAL LOW (ref 6.5–8.1)

## 2022-01-28 LAB — CBC WITH DIFFERENTIAL (CANCER CENTER ONLY)
Abs Immature Granulocytes: 0.02 10*3/uL (ref 0.00–0.07)
Basophils Absolute: 0.1 10*3/uL (ref 0.0–0.1)
Basophils Relative: 1 %
Eosinophils Absolute: 0 10*3/uL (ref 0.0–0.5)
Eosinophils Relative: 1 %
HCT: 23.5 % — ABNORMAL LOW (ref 39.0–52.0)
Hemoglobin: 7.6 g/dL — ABNORMAL LOW (ref 13.0–17.0)
Immature Granulocytes: 0 %
Lymphocytes Relative: 7 %
Lymphs Abs: 0.3 10*3/uL — ABNORMAL LOW (ref 0.7–4.0)
MCH: 30 pg (ref 26.0–34.0)
MCHC: 32.3 g/dL (ref 30.0–36.0)
MCV: 92.9 fL (ref 80.0–100.0)
Monocytes Absolute: 0.7 10*3/uL (ref 0.1–1.0)
Monocytes Relative: 14 %
Neutro Abs: 3.9 10*3/uL (ref 1.7–7.7)
Neutrophils Relative %: 77 %
Platelet Count: 462 10*3/uL — ABNORMAL HIGH (ref 150–400)
RBC: 2.53 MIL/uL — ABNORMAL LOW (ref 4.22–5.81)
RDW: 16.9 % — ABNORMAL HIGH (ref 11.5–15.5)
WBC Count: 5.1 10*3/uL (ref 4.0–10.5)
nRBC: 0 % (ref 0.0–0.2)

## 2022-01-28 MED ORDER — SODIUM CHLORIDE 0.9% FLUSH
10.0000 mL | Freq: Once | INTRAVENOUS | Status: AC
  Administered 2022-01-28: 10 mL

## 2022-01-28 MED ORDER — HEPARIN SOD (PORK) LOCK FLUSH 100 UNIT/ML IV SOLN
500.0000 [IU] | Freq: Once | INTRAVENOUS | Status: AC | PRN
  Administered 2022-01-28: 500 [IU]

## 2022-01-28 MED ORDER — SODIUM CHLORIDE 0.9 % IV SOLN: Freq: Once | INTRAVENOUS | Status: AC

## 2022-01-28 MED ORDER — SODIUM CHLORIDE 0.9% FLUSH
10.0000 mL | INTRAVENOUS | Status: DC | PRN
  Administered 2022-01-28: 10 mL

## 2022-01-28 MED ORDER — SODIUM CHLORIDE 0.9 % IV SOLN
10.0000 mg | Freq: Once | INTRAVENOUS | Status: AC
  Administered 2022-01-28: 10 mg via INTRAVENOUS
  Filled 2022-01-28: qty 10

## 2022-01-28 MED ORDER — SODIUM CHLORIDE 0.9 % IV SOLN
60.0000 mg/m2 | Freq: Once | INTRAVENOUS | Status: AC
  Administered 2022-01-28: 120 mg via INTRAVENOUS
  Filled 2022-01-28: qty 12

## 2022-01-28 NOTE — Progress Notes (Signed)
Hematology and Oncology Follow Up Visit  Jose Wolfe [id number] 1946-07-18 76 y.o. 01/28/2022 8:57 AM Jose Wolfe, MDRoss, Jose Luo, MD   Principle Diagnosis: 76 year old man with advanced prostate cancer with lymphadenopathy diagnosed in 2020.  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   Prior Therapy:  [facility] as treated with brachytherapy and seed implants in 2011.  His PSA started to rise in 2013 was up to 2.42.  His PSA was 20 in 2018.   He enrolled in the HERO trial and he had a initial PSA response in 2018.  In February 2020 was 2.63 and subsequently in March 2019 was 5.09.  He was was started on apalutamide with initial PSA decline to 2.49.  His PSA on October 2 was up again to 4.13.  CT scan of the abdomen and pelvis on 10/04/2019 showed a 2.9 soft tissue mass in the left lower pelvis along the lateral aspect of the bladder that is consistent with metastatic disease.     He has received radiation therapy under the care of Dr. Tammi Wolfe completing 2 weeks of SBRT to the left bladder and continued on Lupron and Erleada.     He had received radiation therapy subsequently under the care of Dr. Tammi Wolfe between June 16 and June 29 for a total of 50 Gray in 10 fractions. His systemic therapy was switched to abiraterone in May 2022.  His PSA on August 20, 2021 was 60.1 with a castrate level testosterone.  His PSA was 4.28 in March 2022.   Abiraterone 1000 mg daily discontinued in October 2022 due to progression of disease.    Current therapy:   Taxotere chemotherapy 75 mg per metered squared started on October 17, 2021.  He is here for cycle 6 of therapy.  Androgen deprivation therapy under the care of Dr. Junious Wolfe.  Interim History: Jose Wolfe returns today for repeat evaluation.  Since last visit, he was hospitalized between January 13 and January 19 for acute kidney injury as well as small bowel  obstruction that was resolved with conservative measures.  He was found to have bilateral hydronephrosis and nephrostomy tube placed.  Since his discharge, he feels close to baseline at this time.  He denies any nausea, vomiting or abdominal pain.  He has regular bowel movements at this time.  He denies any residual neuropathy.     Medications: Updated on review. Current Outpatient Medications  Medication Sig Dispense Refill   B Complex Vitamins (VITAMIN B COMPLEX) TABS Take 1 tablet by mouth daily.     betamethasone dipropionate (DIPROLENE) 0.05 % cream Apply 1 application topically 2 (two) times daily as needed (for psoriasis).   3   budesonide-formoterol (SYMBICORT) 160-4.5 MCG/ACT inhaler Inhale 2 puffs into the lungs 2 (two) times daily.      calcium-vitamin D (OSCAL WITH D) 500-200 MG-UNIT tablet Take 1 tablet by mouth.     Cyanocobalamin (VITAMIN B 12 PO) Take 1 tablet by mouth daily.     docusate sodium (COLACE) 100 MG capsule Take 1 capsule (100 mg total) by mouth 2 (two) times daily. 60 capsule 0   guaiFENesin (MUCINEX) 600 MG 12 hr tablet Take 600 mg by mouth 2 (two) times daily as needed for cough.     leuprolide, 6 Month, (LEUPROLIDE ACETATE, 6 MONTH,) 45 MG injection See admin instructions.     lidocaine-prilocaine (EMLA) cream Apply 1 application topically as needed. (Patient taking differently: Apply 1  application topically as needed (access port).) 30 g 0   megestrol (MEGACE ES) 625 MG/5ML suspension Take 5 mLs (625 mg total) by mouth daily. 150 mL 0   polyethylene glycol (MIRALAX / GLYCOLAX) 17 g packet Take 17 g by mouth 2 (two) times daily. 60 packet 0   prochlorperazine (COMPAZINE) 10 MG tablet Take 1 tablet (10 mg total) by mouth every 6 (six) hours as needed for nausea or vomiting. 60 tablet 3   pseudoephedrine-acetaminophen (TYLENOL SINUS) 30-500 MG TABS tablet Take 1 tablet by mouth every 4 (four) hours as needed (sinus congestion).     senna-docusate (SENNA S) 8.6-50  MG tablet Take 1 tablet by mouth at bedtime as needed for mild constipation. 90 tablet 1   triamcinolone cream (KENALOG) 0.1 % Apply 1 application topically 2 (two) times daily as needed (for psoriasis).  2   VENTOLIN HFA 108 (90 Base) MCG/ACT inhaler INHALE 1 PUFF BY MOUTH EVERY 4 HOURS AS NEEDED 1 each 0   No current facility-administered medications for this visit.     Allergies:  Allergies  Allergen Reactions   Atorvastatin     Other reaction(s): Myalgias      Physical Exam:   Blood pressure 113/64, pulse 78, temperature 98 F (36.7 C), temperature source Axillary, resp. rate 19, height 6' (1.829 m), weight 155 lb 1.6 oz (70.4 kg), SpO2 100 %.    ECOG: 1   General appearance: Alert, awake without any distress. Head: Atraumatic without abnormalities Oropharynx: Without any thrush or ulcers. Eyes: No scleral icterus. Lymph nodes: No lymphadenopathy noted in the cervical, supraclavicular, or axillary nodes Heart:regular rate and rhythm, without any murmurs or gallops.   Lung: Clear to auscultation without any rhonchi, wheezes or dullness to percussion. Abdomin: Soft, nontender without any shifting dullness or ascites. Musculoskeletal: No clubbing or cyanosis. Neurological: No motor or sensory deficits. Skin: No rashes or lesions.      Lab Results: Lab Results  Component Value Date   WBC 10.6 (H) 01/15/2022   HGB 8.4 (L) 01/15/2022   HCT 24.9 (L) 01/15/2022   MCV 90.5 01/15/2022   PLT 354 01/15/2022     Chemistry      Component Value Date/Time   NA 134 (L) 01/15/2022 0334   K 3.8 01/15/2022 0334   CL 106 01/15/2022 0334   CO2 21 (L) 01/15/2022 0334   BUN 11 01/15/2022 0334   CREATININE 0.76 01/15/2022 0334   CREATININE 1.87 (H) 01/07/2022 0841      Component Value Date/Time   CALCIUM 7.5 (L) 01/15/2022 0334   ALKPHOS 77 01/10/2022 0304   AST 128 (H) 01/10/2022 0304   AST 23 01/07/2022 0841   ALT 16 01/10/2022 0304   ALT 14 01/07/2022 0841    BILITOT 0.6 01/10/2022 0304   BILITOT 0.3 01/07/2022 0841      Latest Reference Range & Units 10/17/21 07:58 11/05/21 11:11 11/26/21 08:47 12/17/21 09:36 01/07/22 08:41  Prostate Specific Ag, Serum 0.0 - 4.0 ng/mL 183.0 (H) 256.0 (H) 240.0 (H) 218.0 (H) 217.0 (H)  (H): Data is abnormally high     Impression and Plan:   76 year old with:  1.  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   He is currently on Taxotere chemotherapy without any major complications.  His PSA showed a reasonable but slow decline after 5 cycles of therapy.  Risks and benefits of continuing this treatment were reviewed.  CT scan obtained on January 09, 2022 was personally reviewed and  showed progressive lymphadenopathy which is concerning at this time for a different malignancy versus a slow response to chemotherapy.  For the time being I recommend continuing Taxotere chemotherapy and repeat imaging studies in the next 2 to 3 months to assess response at that time.  He is agreeable to proceed.  2.  IV access: Port-A-Cath continues to be in use for chemotherapy.  3.  Antiemetics: Compazine is available to him without any nausea or vomiting.  4.  Androgen deprivation therapy: He will continue to receive that under the care of Dr. Junious Wolfe.  5.  Bone directed therapy: I recommended continuing calcium and vitamin D supplements.   6.  Growth factor support: He is at risk of neutropenia and possible sepsis.  I recommended continuing growth factor support after each cycle of therapy.  7.  Constipation: Aggressive bowel regimen is recommended with daily MiraLAX and Senokot-S.  This has been successful and I recommended continuing.  8.  Anemia: Improved after transfusion.  This is related to malignancy and chemotherapy.  Hemoglobin is down again and will arrange for transfusion in the next week or so.  9.  Weight loss: Appetite is better and has stabilized his  weight.  10.  Follow-up: In 3 months for a follow-up visit.   30  minutes were spent on this visit.  The time was dedicated to reviewing laboratory data, disease status update and outlining future plan of care.      Zola Button, MD 2/1/20238:57 AM

## 2022-01-28 NOTE — Progress Notes (Signed)
Per Dr. Alen Blew, okay to treat today with hgb 7.6.

## 2022-01-28 NOTE — Patient Instructions (Signed)
[facility]  Discharge Instructions: ?Thank you for choosing [facility] to provide your oncology and hematology care.  ? ?If you have a lab appointment with the Cancer Center, please go directly to the Cancer Center and check in at the registration area. ?  ?Wear comfortable clothing and clothing appropriate for easy access to any Portacath or PICC line.  ? ?We strive to give you quality time with your provider. You may need to reschedule your appointment if you arrive late (15 or more minutes).  Arriving late affects you and other patients whose appointments are after yours.  Also, if you miss three or more appointments without notifying the office, you may be dismissed from the clinic at the provider?s discretion.    ?  ?For prescription refill requests, have your pharmacy contact our office and allow 72 hours for refills to be completed.   ? ?Today you received the following chemotherapy and/or immunotherapy agents : Taxotere    ?  ?To help prevent nausea and vomiting after your treatment, we encourage you to take your nausea medication as directed. ? ?BELOW ARE SYMPTOMS THAT SHOULD BE REPORTED IMMEDIATELY: ?*FEVER GREATER THAN 100.4 F (38 ?C) OR HIGHER ?*CHILLS OR SWEATING ?*NAUSEA AND VOMITING THAT IS NOT CONTROLLED WITH YOUR NAUSEA MEDICATION ?*UNUSUAL SHORTNESS OF BREATH ?*UNUSUAL BRUISING OR BLEEDING ?*URINARY PROBLEMS (pain or burning when urinating, or frequent urination) ?*BOWEL PROBLEMS (unusual diarrhea, constipation, pain near the anus) ?TENDERNESS IN MOUTH AND THROAT WITH OR WITHOUT PRESENCE OF ULCERS (sore throat, sores in mouth, or a toothache) ?UNUSUAL RASH, SWELLING OR PAIN  ?UNUSUAL VAGINAL DISCHARGE OR ITCHING  ? ?Items with * indicate a potential emergency and should be followed up as soon as possible or go to the Emergency Department if any problems should occur. ? ?Please show the CHEMOTHERAPY ALERT CARD or IMMUNOTHERAPY ALERT CARD at check-in to  the Emergency Department and triage nurse. ? ?Should you have questions after your visit or need to cancel or reschedule your appointment, please contact [facility]  Dept: [phone]  and follow the prompts.  Office hours are 8:00 a.m. to 4:30 p.m. Monday - Friday. Please note that voicemails left after 4:00 p.m. may not be returned until the following business day.  We are closed weekends and major holidays. You have access to a nurse at all times for urgent questions. Please call the main number to the clinic Dept: [phone] and follow the prompts. ? ? ?For any non-urgent questions, you may also contact your provider using MyChart. We now offer e-Visits for anyone 18 and older to request care online for non-urgent symptoms. For details visit [email or web address]. ?  ?Also download the MyChart app! Go to the app store, search "MyChart", open the app, select [facility], and log in with your MyChart username and password. ? ?Due to Covid, a mask is required upon entering the hospital/clinic. If you do not have a mask, one will be given to you upon arrival. For doctor visits, patients may have 1 support person aged 18 or older with them. For treatment visits, patients cannot have anyone with them due to current Covid guidelines and our immunocompromised population.  ? ?

## 2022-01-29 ENCOUNTER — Telehealth: Payer: Self-pay | Admitting: Oncology

## 2022-01-29 ENCOUNTER — Telehealth: Payer: Self-pay | Admitting: *Deleted

## 2022-01-29 LAB — PROSTATE-SPECIFIC AG, SERUM (LABCORP): Prostate Specific Ag, Serum: 151 ng/mL — ABNORMAL HIGH (ref 0.0–4.0)

## 2022-01-29 NOTE — Telephone Encounter (Signed)
Scheduled per los, patient has been called regarding upcoming appointments. Left a voicemail.

## 2022-01-29 NOTE — Telephone Encounter (Addendum)
Contacted patient per MD request with information in message below. Patient verbalized understanding   ----- Message from Wyatt Portela, MD sent at 01/29/2022  8:15 AM EST ----- Please let him know his PSA is down

## 2022-01-30 ENCOUNTER — Inpatient Hospital Stay (HOSPITAL_BASED_OUTPATIENT_CLINIC_OR_DEPARTMENT_OTHER): Payer: Medicare HMO

## 2022-01-30 ENCOUNTER — Other Ambulatory Visit: Payer: Self-pay

## 2022-01-30 VITALS — BP 119/63 | HR 77 | Temp 97.8°F | Resp 18

## 2022-01-30 DIAGNOSIS — C61 Malignant neoplasm of prostate: Secondary | ICD-10-CM | POA: Diagnosis not present

## 2022-01-30 DIAGNOSIS — Z5111 Encounter for antineoplastic chemotherapy: Secondary | ICD-10-CM | POA: Diagnosis not present

## 2022-01-30 DIAGNOSIS — Z5189 Encounter for other specified aftercare: Secondary | ICD-10-CM | POA: Diagnosis not present

## 2022-01-30 DIAGNOSIS — Z192 Hormone resistant malignancy status: Secondary | ICD-10-CM

## 2022-01-30 DIAGNOSIS — D6481 Anemia due to antineoplastic chemotherapy: Secondary | ICD-10-CM | POA: Diagnosis not present

## 2022-01-30 DIAGNOSIS — D630 Anemia in neoplastic disease: Secondary | ICD-10-CM | POA: Diagnosis not present

## 2022-01-30 DIAGNOSIS — C7951 Secondary malignant neoplasm of bone: Secondary | ICD-10-CM | POA: Diagnosis not present

## 2022-01-30 MED ORDER — PEGFILGRASTIM-CBQV 6 MG/0.6ML ~~LOC~~ SOSY
6.0000 mg | PREFILLED_SYRINGE | Freq: Once | SUBCUTANEOUS | Status: AC
  Administered 2022-01-30: 6 mg via SUBCUTANEOUS
  Filled 2022-01-30: qty 0.6

## 2022-01-30 NOTE — Patient Instructions (Signed)

## 2022-02-02 ENCOUNTER — Telehealth: Payer: Self-pay | Admitting: Oncology

## 2022-02-02 ENCOUNTER — Encounter: Payer: Self-pay | Admitting: Oncology

## 2022-02-02 NOTE — Telephone Encounter (Signed)
Scheduled per 02/01 los, patient has been called and notified of upcoming appointments. 

## 2022-02-03 DIAGNOSIS — J449 Chronic obstructive pulmonary disease, unspecified: Secondary | ICD-10-CM | POA: Diagnosis not present

## 2022-02-03 DIAGNOSIS — E875 Hyperkalemia: Secondary | ICD-10-CM | POA: Diagnosis not present

## 2022-02-03 DIAGNOSIS — N139 Obstructive and reflux uropathy, unspecified: Secondary | ICD-10-CM | POA: Diagnosis not present

## 2022-02-03 DIAGNOSIS — C7951 Secondary malignant neoplasm of bone: Secondary | ICD-10-CM | POA: Diagnosis not present

## 2022-02-03 DIAGNOSIS — K56609 Unspecified intestinal obstruction, unspecified as to partial versus complete obstruction: Secondary | ICD-10-CM | POA: Diagnosis not present

## 2022-02-03 DIAGNOSIS — N179 Acute kidney failure, unspecified: Secondary | ICD-10-CM | POA: Diagnosis not present

## 2022-02-03 DIAGNOSIS — D701 Agranulocytosis secondary to cancer chemotherapy: Secondary | ICD-10-CM | POA: Diagnosis not present

## 2022-02-03 DIAGNOSIS — T451X5A Adverse effect of antineoplastic and immunosuppressive drugs, initial encounter: Secondary | ICD-10-CM | POA: Diagnosis not present

## 2022-02-03 DIAGNOSIS — C61 Malignant neoplasm of prostate: Secondary | ICD-10-CM | POA: Diagnosis not present

## 2022-02-03 DIAGNOSIS — N1330 Unspecified hydronephrosis: Secondary | ICD-10-CM | POA: Diagnosis not present

## 2022-02-04 ENCOUNTER — Inpatient Hospital Stay: Payer: Medicare HMO

## 2022-02-04 ENCOUNTER — Other Ambulatory Visit: Payer: Self-pay | Admitting: *Deleted

## 2022-02-04 ENCOUNTER — Other Ambulatory Visit: Payer: Self-pay

## 2022-02-04 DIAGNOSIS — C61 Malignant neoplasm of prostate: Secondary | ICD-10-CM

## 2022-02-04 DIAGNOSIS — Z5189 Encounter for other specified aftercare: Secondary | ICD-10-CM | POA: Diagnosis not present

## 2022-02-04 DIAGNOSIS — Z5111 Encounter for antineoplastic chemotherapy: Secondary | ICD-10-CM | POA: Diagnosis not present

## 2022-02-04 DIAGNOSIS — D6481 Anemia due to antineoplastic chemotherapy: Secondary | ICD-10-CM | POA: Diagnosis not present

## 2022-02-04 DIAGNOSIS — C7951 Secondary malignant neoplasm of bone: Secondary | ICD-10-CM | POA: Diagnosis not present

## 2022-02-04 DIAGNOSIS — D630 Anemia in neoplastic disease: Secondary | ICD-10-CM | POA: Diagnosis not present

## 2022-02-04 DIAGNOSIS — Z95828 Presence of other vascular implants and grafts: Secondary | ICD-10-CM

## 2022-02-04 LAB — CBC WITH DIFFERENTIAL (CANCER CENTER ONLY)
Abs Immature Granulocytes: 1.62 10*3/uL — ABNORMAL HIGH (ref 0.00–0.07)
Basophils Absolute: 0.1 10*3/uL (ref 0.0–0.1)
Basophils Relative: 1 %
Eosinophils Absolute: 0.2 10*3/uL (ref 0.0–0.5)
Eosinophils Relative: 1 %
HCT: 23.2 % — ABNORMAL LOW (ref 39.0–52.0)
Hemoglobin: 7.5 g/dL — ABNORMAL LOW (ref 13.0–17.0)
Immature Granulocytes: 12 %
Lymphocytes Relative: 6 %
Lymphs Abs: 0.8 10*3/uL (ref 0.7–4.0)
MCH: 30.1 pg (ref 26.0–34.0)
MCHC: 32.3 g/dL (ref 30.0–36.0)
MCV: 93.2 fL (ref 80.0–100.0)
Monocytes Absolute: 1.8 10*3/uL — ABNORMAL HIGH (ref 0.1–1.0)
Monocytes Relative: 14 %
Neutro Abs: 8.8 10*3/uL — ABNORMAL HIGH (ref 1.7–7.7)
Neutrophils Relative %: 66 %
Platelet Count: 485 10*3/uL — ABNORMAL HIGH (ref 150–400)
RBC: 2.49 MIL/uL — ABNORMAL LOW (ref 4.22–5.81)
RDW: 17.2 % — ABNORMAL HIGH (ref 11.5–15.5)
WBC Count: 13.3 10*3/uL — ABNORMAL HIGH (ref 4.0–10.5)
nRBC: 1.3 % — ABNORMAL HIGH (ref 0.0–0.2)

## 2022-02-04 LAB — CMP (CANCER CENTER ONLY)
ALT: 15 U/L (ref 0–44)
AST: 22 U/L (ref 15–41)
Albumin: 3.3 g/dL — ABNORMAL LOW (ref 3.5–5.0)
Alkaline Phosphatase: 111 U/L (ref 38–126)
Anion gap: 6 (ref 5–15)
BUN: 14 mg/dL (ref 8–23)
CO2: 28 mmol/L (ref 22–32)
Calcium: 8.4 mg/dL — ABNORMAL LOW (ref 8.9–10.3)
Chloride: 102 mmol/L (ref 98–111)
Creatinine: 0.84 mg/dL (ref 0.61–1.24)
GFR, Estimated: >60 mL/min (ref >60)
Glucose, Bld: 96 mg/dL (ref 70–99)
Potassium: 4.4 mmol/L (ref 3.5–5.1)
Sodium: 136 mmol/L (ref 135–145)
Total Bilirubin: 0.3 mg/dL (ref 0.3–1.2)
Total Protein: 6.1 g/dL — ABNORMAL LOW (ref 6.5–8.1)

## 2022-02-04 LAB — SAMPLE TO BLOOD BANK

## 2022-02-04 LAB — PREPARE RBC (CROSSMATCH)

## 2022-02-04 MED ORDER — DIPHENHYDRAMINE HCL 25 MG PO CAPS
25.0000 mg | ORAL_CAPSULE | Freq: Once | ORAL | Status: AC
  Administered 2022-02-04: 25 mg via ORAL
  Filled 2022-02-04: qty 1

## 2022-02-04 MED ORDER — SODIUM CHLORIDE 0.9% FLUSH
10.0000 mL | Freq: Once | INTRAVENOUS | Status: AC
  Administered 2022-02-04: 10 mL

## 2022-02-04 MED ORDER — SODIUM CHLORIDE 0.9% IV SOLUTION
250.0000 mL | Freq: Once | INTRAVENOUS | Status: AC
  Administered 2022-02-04: 250 mL via INTRAVENOUS

## 2022-02-04 MED ORDER — HEPARIN SOD (PORK) LOCK FLUSH 100 UNIT/ML IV SOLN: 500.0000 [IU] | Freq: Every day | INTRAVENOUS | Status: DC | PRN

## 2022-02-04 MED ORDER — ACETAMINOPHEN 325 MG PO TABS
650.0000 mg | ORAL_TABLET | Freq: Once | ORAL | Status: AC
  Administered 2022-02-04: 650 mg via ORAL
  Filled 2022-02-04: qty 2

## 2022-02-04 MED ORDER — SODIUM CHLORIDE 0.9% FLUSH: 10.0000 mL | INTRAVENOUS | Status: DC | PRN

## 2022-02-05 DIAGNOSIS — N139 Obstructive and reflux uropathy, unspecified: Secondary | ICD-10-CM | POA: Diagnosis not present

## 2022-02-05 DIAGNOSIS — K56609 Unspecified intestinal obstruction, unspecified as to partial versus complete obstruction: Secondary | ICD-10-CM | POA: Diagnosis not present

## 2022-02-05 DIAGNOSIS — C61 Malignant neoplasm of prostate: Secondary | ICD-10-CM | POA: Diagnosis not present

## 2022-02-05 DIAGNOSIS — N179 Acute kidney failure, unspecified: Secondary | ICD-10-CM | POA: Diagnosis not present

## 2022-02-05 DIAGNOSIS — N1330 Unspecified hydronephrosis: Secondary | ICD-10-CM | POA: Diagnosis not present

## 2022-02-05 DIAGNOSIS — E875 Hyperkalemia: Secondary | ICD-10-CM | POA: Diagnosis not present

## 2022-02-05 DIAGNOSIS — J449 Chronic obstructive pulmonary disease, unspecified: Secondary | ICD-10-CM | POA: Diagnosis not present

## 2022-02-05 DIAGNOSIS — C7951 Secondary malignant neoplasm of bone: Secondary | ICD-10-CM | POA: Diagnosis not present

## 2022-02-05 DIAGNOSIS — D701 Agranulocytosis secondary to cancer chemotherapy: Secondary | ICD-10-CM | POA: Diagnosis not present

## 2022-02-05 DIAGNOSIS — T451X5A Adverse effect of antineoplastic and immunosuppressive drugs, initial encounter: Secondary | ICD-10-CM | POA: Diagnosis not present

## 2022-02-05 LAB — TYPE AND SCREEN
ABO/RH(D): A POS
Antibody Screen: NEGATIVE
Unit division: 0
Unit division: 0

## 2022-02-05 LAB — BPAM RBC
Blood Product Expiration Date: 202303012359
Blood Product Expiration Date: 202303012359
ISSUE DATE / TIME: 202302081303
ISSUE DATE / TIME: 202302081303
Unit Type and Rh: 6200
Unit Type and Rh: 6200

## 2022-02-05 LAB — PROSTATE-SPECIFIC AG, SERUM (LABCORP): Prostate Specific Ag, Serum: 147 ng/mL — ABNORMAL HIGH (ref 0.0–4.0)

## 2022-02-12 ENCOUNTER — Ambulatory Visit (HOSPITAL_COMMUNITY)
Admission: RE | Admit: 2022-02-12 | Discharge: 2022-02-12 | Disposition: A | Discharge status: Home / Self Care | Payer: Medicare HMO | Source: Ambulatory Visit | Attending: Family Medicine | Admitting: Family Medicine

## 2022-02-12 ENCOUNTER — Other Ambulatory Visit: Payer: Self-pay

## 2022-02-12 ENCOUNTER — Other Ambulatory Visit (HOSPITAL_COMMUNITY): Payer: Self-pay | Admitting: Family Medicine

## 2022-02-12 DIAGNOSIS — R600 Localized edema: Secondary | ICD-10-CM

## 2022-02-12 DIAGNOSIS — M7989 Other specified soft tissue disorders: Secondary | ICD-10-CM | POA: Diagnosis not present

## 2022-02-16 ENCOUNTER — Other Ambulatory Visit: Payer: Self-pay

## 2022-02-16 DIAGNOSIS — I872 Venous insufficiency (chronic) (peripheral): Secondary | ICD-10-CM

## 2022-02-17 ENCOUNTER — Emergency Department (HOSPITAL_COMMUNITY): Payer: Medicare HMO

## 2022-02-17 ENCOUNTER — Other Ambulatory Visit: Payer: Self-pay

## 2022-02-17 ENCOUNTER — Inpatient Hospital Stay (HOSPITAL_COMMUNITY)
Admission: EM | Admit: 2022-02-17 | Discharge: 2022-02-23 | DRG: 853 | Disposition: A | Discharge status: Hospice | Payer: Medicare HMO | Attending: Internal Medicine | Admitting: Internal Medicine

## 2022-02-17 DIAGNOSIS — E871 Hypo-osmolality and hyponatremia: Secondary | ICD-10-CM | POA: Diagnosis present

## 2022-02-17 DIAGNOSIS — C7951 Secondary malignant neoplasm of bone: Secondary | ICD-10-CM | POA: Diagnosis present

## 2022-02-17 DIAGNOSIS — M19012 Primary osteoarthritis, left shoulder: Secondary | ICD-10-CM | POA: Diagnosis not present

## 2022-02-17 DIAGNOSIS — Z743 Need for continuous supervision: Secondary | ICD-10-CM | POA: Diagnosis not present

## 2022-02-17 DIAGNOSIS — Z936 Other artificial openings of urinary tract status: Secondary | ICD-10-CM

## 2022-02-17 DIAGNOSIS — R6521 Severe sepsis with septic shock: Secondary | ICD-10-CM | POA: Diagnosis present

## 2022-02-17 DIAGNOSIS — N179 Acute kidney failure, unspecified: Secondary | ICD-10-CM | POA: Diagnosis present

## 2022-02-17 DIAGNOSIS — K449 Diaphragmatic hernia without obstruction or gangrene: Secondary | ICD-10-CM | POA: Diagnosis present

## 2022-02-17 DIAGNOSIS — I2699 Other pulmonary embolism without acute cor pulmonale: Secondary | ICD-10-CM | POA: Diagnosis present

## 2022-02-17 DIAGNOSIS — R531 Weakness: Secondary | ICD-10-CM | POA: Diagnosis not present

## 2022-02-17 DIAGNOSIS — Z95828 Presence of other vascular implants and grafts: Secondary | ICD-10-CM

## 2022-02-17 DIAGNOSIS — J9601 Acute respiratory failure with hypoxia: Secondary | ICD-10-CM | POA: Diagnosis not present

## 2022-02-17 DIAGNOSIS — E872 Acidosis, unspecified: Secondary | ICD-10-CM | POA: Diagnosis present

## 2022-02-17 DIAGNOSIS — I2609 Other pulmonary embolism with acute cor pulmonale: Secondary | ICD-10-CM | POA: Diagnosis present

## 2022-02-17 DIAGNOSIS — Z888 Allergy status to other drugs, medicaments and biological substances status: Secondary | ICD-10-CM

## 2022-02-17 DIAGNOSIS — C61 Malignant neoplasm of prostate: Secondary | ICD-10-CM | POA: Diagnosis present

## 2022-02-17 DIAGNOSIS — J9811 Atelectasis: Secondary | ICD-10-CM | POA: Diagnosis present

## 2022-02-17 DIAGNOSIS — R42 Dizziness and giddiness: Secondary | ICD-10-CM | POA: Diagnosis not present

## 2022-02-17 DIAGNOSIS — R0902 Hypoxemia: Secondary | ICD-10-CM | POA: Diagnosis not present

## 2022-02-17 DIAGNOSIS — A411 Sepsis due to other specified staphylococcus: Secondary | ICD-10-CM | POA: Diagnosis not present

## 2022-02-17 DIAGNOSIS — N390 Urinary tract infection, site not specified: Secondary | ICD-10-CM | POA: Diagnosis present

## 2022-02-17 DIAGNOSIS — I959 Hypotension, unspecified: Secondary | ICD-10-CM | POA: Diagnosis present

## 2022-02-17 DIAGNOSIS — Z66 Do not resuscitate: Secondary | ICD-10-CM | POA: Diagnosis present

## 2022-02-17 DIAGNOSIS — M19011 Primary osteoarthritis, right shoulder: Secondary | ICD-10-CM | POA: Diagnosis not present

## 2022-02-17 DIAGNOSIS — I82412 Acute embolism and thrombosis of left femoral vein: Secondary | ICD-10-CM | POA: Diagnosis present

## 2022-02-17 DIAGNOSIS — R000 Tachycardia, unspecified: Secondary | ICD-10-CM | POA: Diagnosis not present

## 2022-02-17 DIAGNOSIS — N139 Obstructive and reflux uropathy, unspecified: Secondary | ICD-10-CM | POA: Diagnosis present

## 2022-02-17 DIAGNOSIS — Z7951 Long term (current) use of inhaled steroids: Secondary | ICD-10-CM

## 2022-02-17 DIAGNOSIS — K5900 Constipation, unspecified: Secondary | ICD-10-CM | POA: Diagnosis not present

## 2022-02-17 DIAGNOSIS — I4891 Unspecified atrial fibrillation: Secondary | ICD-10-CM | POA: Diagnosis present

## 2022-02-17 DIAGNOSIS — R0689 Other abnormalities of breathing: Secondary | ICD-10-CM | POA: Diagnosis not present

## 2022-02-17 DIAGNOSIS — I824Y2 Acute embolism and thrombosis of unspecified deep veins of left proximal lower extremity: Secondary | ICD-10-CM | POA: Diagnosis present

## 2022-02-17 DIAGNOSIS — Z1611 Resistance to penicillins: Secondary | ICD-10-CM | POA: Diagnosis present

## 2022-02-17 DIAGNOSIS — E43 Unspecified severe protein-calorie malnutrition: Secondary | ICD-10-CM | POA: Insufficient documentation

## 2022-02-17 DIAGNOSIS — Z8042 Family history of malignant neoplasm of prostate: Secondary | ICD-10-CM

## 2022-02-17 DIAGNOSIS — W1811XA Fall from or off toilet without subsequent striking against object, initial encounter: Secondary | ICD-10-CM | POA: Diagnosis present

## 2022-02-17 DIAGNOSIS — J449 Chronic obstructive pulmonary disease, unspecified: Secondary | ICD-10-CM | POA: Diagnosis present

## 2022-02-17 DIAGNOSIS — Z6821 Body mass index (BMI) 21.0-21.9, adult: Secondary | ICD-10-CM

## 2022-02-17 DIAGNOSIS — I071 Rheumatic tricuspid insufficiency: Secondary | ICD-10-CM | POA: Diagnosis present

## 2022-02-17 DIAGNOSIS — Y92002 Bathroom of unspecified non-institutional (private) residence single-family (private) house as the place of occurrence of the external cause: Secondary | ICD-10-CM

## 2022-02-17 DIAGNOSIS — I469 Cardiac arrest, cause unspecified: Secondary | ICD-10-CM | POA: Diagnosis not present

## 2022-02-17 DIAGNOSIS — B952 Enterococcus as the cause of diseases classified elsewhere: Secondary | ICD-10-CM | POA: Diagnosis present

## 2022-02-17 DIAGNOSIS — R7989 Other specified abnormal findings of blood chemistry: Secondary | ICD-10-CM | POA: Diagnosis present

## 2022-02-17 DIAGNOSIS — Z79899 Other long term (current) drug therapy: Secondary | ICD-10-CM

## 2022-02-17 DIAGNOSIS — R7881 Bacteremia: Secondary | ICD-10-CM

## 2022-02-17 DIAGNOSIS — I82422 Acute embolism and thrombosis of left iliac vein: Secondary | ICD-10-CM | POA: Diagnosis present

## 2022-02-17 LAB — LIPASE, BLOOD: Lipase: 28 U/L (ref 11–51)

## 2022-02-17 LAB — COMPREHENSIVE METABOLIC PANEL
ALT: 27 U/L (ref 0–44)
AST: 49 U/L — ABNORMAL HIGH (ref 15–41)
Albumin: 2.6 g/dL — ABNORMAL LOW (ref 3.5–5.0)
Alkaline Phosphatase: 79 U/L (ref 38–126)
Anion gap: 15 (ref 5–15)
BUN: 13 mg/dL (ref 8–23)
CO2: 19 mmol/L — ABNORMAL LOW (ref 22–32)
Calcium: 7.8 mg/dL — ABNORMAL LOW (ref 8.9–10.3)
Chloride: 98 mmol/L (ref 98–111)
Creatinine, Ser: 1.06 mg/dL (ref 0.61–1.24)
GFR, Estimated: >60 mL/min (ref >60)
Glucose, Bld: 185 mg/dL — ABNORMAL HIGH (ref 70–99)
Potassium: 4.8 mmol/L (ref 3.5–5.1)
Sodium: 132 mmol/L — ABNORMAL LOW (ref 135–145)
Total Bilirubin: 0.7 mg/dL (ref 0.3–1.2)
Total Protein: 6.1 g/dL — ABNORMAL LOW (ref 6.5–8.1)

## 2022-02-17 LAB — URINALYSIS, ROUTINE W REFLEX MICROSCOPIC
Bilirubin Urine: NEGATIVE
Glucose, UA: NEGATIVE mg/dL
Ketones, ur: NEGATIVE mg/dL
Nitrite: POSITIVE — AB
Protein, ur: >=300 mg/dL — AB
RBC / HPF: >50 RBC/hpf — ABNORMAL HIGH (ref 0–5)
Specific Gravity, Urine: 1.02 (ref 1.005–1.030)
WBC, UA: >50 WBC/hpf — ABNORMAL HIGH (ref 0–5)
pH: 7 (ref 5.0–8.0)

## 2022-02-17 LAB — PROTIME-INR
INR: 1.2 (ref 0.8–1.2)
Prothrombin Time: 14.8 seconds (ref 11.4–15.2)

## 2022-02-17 LAB — CBC WITH DIFFERENTIAL/PLATELET
Abs Immature Granulocytes: 0.14 10*3/uL — ABNORMAL HIGH (ref 0.00–0.07)
Basophils Absolute: 0 10*3/uL (ref 0.0–0.1)
Basophils Relative: 0 %
Eosinophils Absolute: 0 10*3/uL (ref 0.0–0.5)
Eosinophils Relative: 0 %
HCT: 31.4 % — ABNORMAL LOW (ref 39.0–52.0)
Hemoglobin: 10.1 g/dL — ABNORMAL LOW (ref 13.0–17.0)
Immature Granulocytes: 1 %
Lymphocytes Relative: 2 %
Lymphs Abs: 0.3 10*3/uL — ABNORMAL LOW (ref 0.7–4.0)
MCH: 30.3 pg (ref 26.0–34.0)
MCHC: 32.2 g/dL (ref 30.0–36.0)
MCV: 94.3 fL (ref 80.0–100.0)
Monocytes Absolute: 1 10*3/uL (ref 0.1–1.0)
Monocytes Relative: 6 %
Neutro Abs: 15.5 10*3/uL — ABNORMAL HIGH (ref 1.7–7.7)
Neutrophils Relative %: 91 %
Platelets: 336 10*3/uL (ref 150–400)
RBC: 3.33 MIL/uL — ABNORMAL LOW (ref 4.22–5.81)
RDW: 16.2 % — ABNORMAL HIGH (ref 11.5–15.5)
WBC: 16.9 10*3/uL — ABNORMAL HIGH (ref 4.0–10.5)
nRBC: 0 % (ref 0.0–0.2)

## 2022-02-17 LAB — D-DIMER, QUANTITATIVE: D-Dimer, Quant: 16.38 ug/mL-FEU — ABNORMAL HIGH (ref 0.00–0.50)

## 2022-02-17 LAB — MAGNESIUM: Magnesium: 2 mg/dL (ref 1.7–2.4)

## 2022-02-17 LAB — LACTIC ACID, PLASMA: Lactic Acid, Venous: 5.1 mmol/L (ref 0.5–1.9)

## 2022-02-17 LAB — TROPONIN I (HIGH SENSITIVITY): Troponin I (High Sensitivity): 636 ng/L (ref <18)

## 2022-02-17 MED ORDER — LACTATED RINGERS IV SOLN: INTRAVENOUS | Status: DC

## 2022-02-17 MED ORDER — VANCOMYCIN HCL IN DEXTROSE 1-5 GM/200ML-% IV SOLN: 1000.0000 mg | Freq: Once | INTRAVENOUS | Status: DC

## 2022-02-17 MED ORDER — LACTATED RINGERS IV BOLUS (SEPSIS)
1000.0000 mL | Freq: Once | INTRAVENOUS | Status: AC
  Administered 2022-02-17: 1000 mL via INTRAVENOUS

## 2022-02-17 MED ORDER — SODIUM CHLORIDE 0.9 % IV SOLN: 2.0000 g | INTRAVENOUS | Status: DC

## 2022-02-17 MED ORDER — VANCOMYCIN HCL 1500 MG/300ML IV SOLN
1500.0000 mg | Freq: Once | INTRAVENOUS | Status: DC
  Filled 2022-02-17: qty 300

## 2022-02-17 MED ORDER — LACTATED RINGERS IV BOLUS (SEPSIS)
250.0000 mL | Freq: Once | INTRAVENOUS | Status: AC
  Administered 2022-02-18: 250 mL via INTRAVENOUS

## 2022-02-17 MED ORDER — VANCOMYCIN HCL 1250 MG/250ML IV SOLN: 1250.0000 mg | Freq: Two times a day (BID) | INTRAVENOUS | Status: DC

## 2022-02-17 MED ORDER — SODIUM CHLORIDE 0.9 % IV SOLN
2.0000 g | Freq: Two times a day (BID) | INTRAVENOUS | Status: DC
  Administered 2022-02-17: 2 g via INTRAVENOUS

## 2022-02-17 MED ORDER — NOREPINEPHRINE 4 MG/250ML-% IV SOLN: 0.0000 ug/min | INTRAVENOUS | Status: DC

## 2022-02-17 MED ORDER — SODIUM CHLORIDE 0.9 % IV SOLN
2.0000 g | Freq: Once | INTRAVENOUS | Status: DC
  Filled 2022-02-17: qty 2

## 2022-02-17 MED ORDER — IOHEXOL 350 MG/ML SOLN
79.0000 mL | Freq: Once | INTRAVENOUS | Status: AC | PRN
  Administered 2022-02-17: 79 mL via INTRAVENOUS

## 2022-02-17 MED ORDER — METRONIDAZOLE 500 MG/100ML IV SOLN
500.0000 mg | Freq: Once | INTRAVENOUS | Status: DC
Start: 2022-02-17 — End: 2022-02-18
  Administered 2022-02-18: 500 mg via INTRAVENOUS
  Filled 2022-02-17: qty 100

## 2022-02-17 MED FILL — Dexamethasone Sodium Phosphate Inj 100 MG/10ML: INTRAMUSCULAR | Qty: 1 | Status: AC

## 2022-02-17 NOTE — ED Provider Notes (Signed)
[facility] 2 EMERGENCY DEPARTMENT Provider Note   CSN: [id number] Arrival date & time: 02/17/22  2106     History  Chief Complaint  Patient presents with   Weakness   Hypotension   Emesis    Lopez Dentinger is a 76 y.o. male.   Weakness Associated symptoms: nausea, shortness of breath and vomiting   Emesis Patient presents for generalized weakness.  Medical history includes metastatic prostate cancer, COPD, bilateral nephrostomy tubes placed on 1/16.  His last chemotherapy session was 3 weeks ago.  Recent history includes the following: Patient has had severe left leg swelling for the past several weeks.  He was seen by a vein specialist and told that this was not due to blood clots.  He was told that it was due to external compression on a proximal leg vein.  He has also had blood and pus discharge from his penis.  Patient no longer urinates through his penis.  He does have bilateral nephrostomy bags which have been having normal output.  He has noticed a small amount of blood-tinged to his nephrostomy bag output.  He had recent anemia and underwent a transfusion.  Yesterday, he felt well overall and had a busy day.  Today, he was unable to walk from the bedroom to the bathroom.  This is due to both muscle weakness as well as shortness of breath.  At baseline, he is not on supplemental oxygen.  EMS found him to be hypotensive, tachycardic, and hypoxic.  He was placed on 5 L of supplemental oxygen.    Home Medications Prior to Admission medications   Medication Sig Start Date End Date Taking? Authorizing Provider  acetaminophen (TYLENOL) 500 MG tablet Take 1,000 mg by mouth every 6 (six) hours as needed for moderate pain or headache.   Yes [provider]  B Complex Vitamins (VITAMIN B COMPLEX) TABS Take 1 tablet by mouth at bedtime.   Yes [provider]  betamethasone dipropionate (DIPROLENE) 0.05 % cream Apply 1 application topically 2  (two) times daily as needed (for psoriasis).  10/28/18  Yes [provider]  budesonide-formoterol (SYMBICORT) 160-4.5 MCG/ACT inhaler Inhale 2 puffs into the lungs 2 (two) times daily.    Yes [provider]  calcium-vitamin D (OSCAL WITH D) 500-200 MG-UNIT tablet Take 1 tablet by mouth 2 (two) times daily.   Yes [provider]  docusate sodium (COLACE) 100 MG capsule Take 100 mg by mouth at bedtime.   Yes [provider]  guaiFENesin (MUCINEX) 600 MG 12 hr tablet Take 600 mg by mouth 2 (two) times daily as needed for cough.   Yes [provider]  leuprolide, 6 Month, (LEUPROLIDE ACETATE, 6 MONTH,) 45 MG injection Inject 45 mg into the skin every 6 (six) months.   Yes [provider]  lidocaine-prilocaine (EMLA) cream Apply 1 application topically as needed. Patient taking differently: Apply 1 application topically as needed (access port). 09/23/21  Yes Wyatt Portela, MD  prochlorperazine (COMPAZINE) 10 MG tablet Take 1 tablet (10 mg total) by mouth every 6 (six) hours as needed for nausea or vomiting. 11/05/21  Yes Wyatt Portela, MD  senna-docusate (SENNA S) 8.6-50 MG tablet Take 1 tablet by mouth at bedtime as needed for mild constipation. 01/07/22  Yes Wyatt Portela, MD  triamcinolone cream (KENALOG) 0.1 % Apply 1 application topically 2 (two) times daily as needed (for psoriasis). 10/28/18  Yes [provider]  VENTOLIN HFA 108 (90 Base)  MCG/ACT inhaler INHALE 1 PUFF BY MOUTH EVERY 4 HOURS AS NEEDED Patient taking differently: Inhale 1 puff into the lungs every 4 (four) hours as needed for wheezing or shortness of breath. 09/10/21  Yes Croitoru, Mihai, MD  megestrol (MEGACE ES) 625 MG/5ML suspension Take 5 mLs (625 mg total) by mouth daily. Patient not taking: Reported on 02/18/2022 01/02/22   Wyatt Portela, MD      Allergies    Atorvastatin    Review of Systems   Review of Systems  Constitutional:  Positive for activity change  and fatigue.  Respiratory:  Positive for shortness of breath.   Cardiovascular:  Positive for leg swelling.  Gastrointestinal:  Positive for nausea and vomiting.  Neurological:  Positive for weakness (Generalized).  All other systems reviewed and are negative.  Physical Exam Updated Vital Signs BP (!) 92/57    Pulse 100    Temp 100.2 F (37.9 C) (Oral)    Resp (!) 24    Ht 6' (1.829 m)    Wt 70.4 kg    SpO2 95%    BMI 21.05 kg/m  Physical Exam Constitutional:      General: He is awake. He is not in acute distress.    Appearance: He is normal weight. He is ill-appearing.  HENT:     Head: Normocephalic and atraumatic.     Right Ear: External ear normal.     Left Ear: External ear normal.     Nose: Nose normal.     Mouth/Throat:     Mouth: Mucous membranes are moist.     Pharynx: Oropharynx is clear.  Eyes:     Extraocular Movements: Extraocular movements intact.  Cardiovascular:     Rate and Rhythm: Regular rhythm. Tachycardia present.  Pulmonary:     Effort: Tachypnea present. No accessory muscle usage or respiratory distress.     Breath sounds: No decreased air movement. No decreased breath sounds, wheezing or rales.     Comments: On 5 L of supplemental oxygen Abdominal:     Palpations: Abdomen is soft.     Tenderness: There is no abdominal tenderness.  Genitourinary:    Comments: Bilateral nephrostomy tubes in place.  No purulence or erythema surrounding neph tube sites.  No evidence of purulent drainage from urethra.  Small amount of blood on pad that patient is wearing over his urethra.  No active bleeding. Musculoskeletal:        General: Swelling present.     Cervical back: Normal range of motion. No rigidity.     Comments: Significantly swollen left lower extremity, without tenderness  Skin:    General: Skin is warm and dry.  Neurological:     General: No focal deficit present.     Mental Status: He is alert and oriented to person, place, and time.     Cranial  Nerves: No cranial nerve deficit.     Sensory: No sensory deficit.     Motor: No weakness.     Coordination: Coordination normal.  Psychiatric:        Mood and Affect: Mood normal.        Behavior: Behavior normal.        Thought Content: Thought content normal.        Judgment: Judgment normal.    ED Results / Procedures / Treatments   Labs (all labs ordered are listed, but only abnormal results are displayed) Labs Reviewed  LACTIC ACID, PLASMA - Abnormal; Notable for the following components:  Result Value   Lactic Acid, Venous 5.1 (*)    All other components within normal limits  LACTIC ACID, PLASMA - Abnormal; Notable for the following components:   Lactic Acid, Venous 4.4 (*)    All other components within normal limits  COMPREHENSIVE METABOLIC PANEL - Abnormal; Notable for the following components:   Sodium 132 (*)    CO2 19 (*)    Glucose, Bld 185 (*)    Calcium 7.8 (*)    Total Protein 6.1 (*)    Albumin 2.6 (*)    AST 49 (*)    All other components within normal limits  CBC WITH DIFFERENTIAL/PLATELET - Abnormal; Notable for the following components:   WBC 16.9 (*)    RBC 3.33 (*)    Hemoglobin 10.1 (*)    HCT 31.4 (*)    RDW 16.2 (*)    Neutro Abs 15.5 (*)    Lymphs Abs 0.3 (*)    Abs Immature Granulocytes 0.14 (*)    All other components within normal limits  URINALYSIS, ROUTINE W REFLEX MICROSCOPIC - Abnormal; Notable for the following components:   Color, Urine AMBER (*)    APPearance CLOUDY (*)    Hgb urine dipstick MODERATE (*)    Protein, ur >=300 (*)    Nitrite POSITIVE (*)    Leukocytes,Ua LARGE (*)    RBC / HPF >50 (*)    WBC, UA >50 (*)    Bacteria, UA MANY (*)    Non Squamous Epithelial 0-5 (*)    All other components within normal limits  D-DIMER, QUANTITATIVE - Abnormal; Notable for the following components:   D-Dimer, Quant 16.38 (*)    All other components within normal limits  HEPARIN LEVEL (UNFRACTIONATED) - Abnormal; Notable for  the following components:   Heparin Unfractionated 0.26 (*)    All other components within normal limits  CBC - Abnormal; Notable for the following components:   WBC 15.0 (*)    RBC 3.15 (*)    Hemoglobin 9.5 (*)    HCT 29.2 (*)    RDW 16.1 (*)    All other components within normal limits  BASIC METABOLIC PANEL - Abnormal; Notable for the following components:   Sodium 130 (*)    CO2 20 (*)    Glucose, Bld 144 (*)    Calcium 7.4 (*)    All other components within normal limits  LACTIC ACID, PLASMA - Abnormal; Notable for the following components:   Lactic Acid, Venous 3.4 (*)    All other components within normal limits  GLUCOSE, CAPILLARY - Abnormal; Notable for the following components:   Glucose-Capillary 133 (*)    All other components within normal limits  GLUCOSE, CAPILLARY - Abnormal; Notable for the following components:   Glucose-Capillary 108 (*)    All other components within normal limits  TROPONIN I (HIGH SENSITIVITY) - Abnormal; Notable for the following components:   Troponin I (High Sensitivity) 636 (*)    All other components within normal limits  TROPONIN I (HIGH SENSITIVITY) - Abnormal; Notable for the following components:   Troponin I (High Sensitivity) 722 (*)    All other components within normal limits  TROPONIN I (HIGH SENSITIVITY) - Abnormal; Notable for the following components:   Troponin I (High Sensitivity) 721 (*)    All other components within normal limits  CULTURE, BLOOD (ROUTINE X 2)  CULTURE, BLOOD (ROUTINE X 2)  MRSA NEXT GEN BY PCR, NASAL  URINE CULTURE  PROTIME-INR  MAGNESIUM  LIPASE, BLOOD  MAGNESIUM  PHOSPHORUS  HEPARIN LEVEL (UNFRACTIONATED)  POC OCCULT BLOOD, ED  POCT ACTIVATED CLOTTING TIME  POCT ACTIVATED CLOTTING TIME  POCT ACTIVATED CLOTTING TIME    EKG None  Radiology CT Angio Chest PE W and/or Wo Contrast  Addendum Date: 02/18/2022   ADDENDUM REPORT: 02/18/2022 00:07 ADDENDUM: Results were discussed with Dr.  Doren Custard at 12:04 a.m. [place] on February 18, 2022. Electronically Signed   By: Virgina Norfolk M.D.   On: 02/18/2022 00:07   Result Date: 02/18/2022 CLINICAL DATA:  New onset hypoxia. EXAM: CT ANGIOGRAPHY CHEST WITH CONTRAST TECHNIQUE: Multidetector CT imaging of the chest was performed using the standard protocol during bolus administration of intravenous contrast. Multiplanar CT image reconstructions and MIPs were obtained to evaluate the vascular anatomy. RADIATION DOSE REDUCTION: This exam was performed according to the departmental dose-optimization program which includes automated exposure control, adjustment of the mA and/or kV according to patient size and/or use of iterative reconstruction technique. CONTRAST:  52mL OMNIPAQUE IOHEXOL 350 MG/ML SOLN COMPARISON:  January 09, 2022 FINDINGS: Cardiovascular: Satisfactory opacification of the pulmonary arteries to the segmental level. Extensive intraluminal filling defects are seen within the distal aspects of the bilateral pulmonary arteries and numerous bilateral upper lobe, right middle lobe and bilateral lower lobe branches. There is no evidence of saddle embolus. Normal heart size with right heart strain noted (RV/LV ratio of approximately 2.27). No pericardial effusion. Mediastinum/Nodes: No enlarged mediastinal, hilar, or axillary lymph nodes. Thyroid gland, trachea, and esophagus demonstrate no significant findings. Lungs/Pleura: Lungs are clear. No pleural effusion or pneumothorax. Upper Abdomen: There is a small hiatal hernia. Moderate severity diffuse calcification of the right adrenal gland is seen, with mild diffuse left adrenal gland enlargement. Musculoskeletal: Multilevel degenerative changes are seen throughout the thoracic spine. Numerous, ill-defined sclerotic areas are also seen throughout the thoracic spine and bilateral ribs. Review of the MIP images confirms the above findings. IMPRESSION: 1. Extensive bilateral pulmonary embolism with  evidence of right heart strain (RV/LV ratio of approximately 2.27). 2. Findings consistent with diffuse osseous metastatic disease. 3. Small hiatal hernia. Electronically Signed: By: Virgina Norfolk M.D. On: 02/18/2022 00:01   IR Angiogram Pulmonary Bilateral Selective  Result Date: 02/18/2022 INDICATION: Acute sub massive bilateral central pulmonary emboli with evidence of right heart strain by echo, troponin leak, and oxygen requirement. EXAM: ULTRASOUND GUIDANCE FOR VASCULAR ACCESS BILATERAL PULMONARY ARTERY CATHETERIZATIONS AND ANGIOGRAMS BILATERAL PULMONARY EMBOLI SUCTION THROMBECTOMY WITH THE PENUMBRA 16 FRENCH DEVICE COMPARISON:  02/17/2022 MEDICATIONS: 1% lidocaine local, 6000 units heparin, 250 mg normal saline bolus ANESTHESIA/SEDATION: Moderate (conscious) sedation was employed during this procedure. A total of Versed 2.5 mg and Fentanyl 75 mcg was administered intravenously by the radiology nurse. Total intra-service moderate Sedation Time: 101 minutes. The patient's level of consciousness and vital signs were monitored continuously by radiology nursing throughout the procedure under my direct supervision. FLUOROSCOPY: Radiation Exposure Index (as provided by the fluoroscopic device): 161 mGy Kerma COMPLICATIONS: None immediate. TECHNIQUE: Informed written consent was obtained from the patient after a thorough discussion of the procedural risks, benefits and alternatives. All questions were addressed. Maximal Sterile Barrier Technique was utilized including caps, mask, sterile gowns, sterile gloves, sterile drape, hand hygiene and skin antiseptic. A timeout was performed prior to the initiation of the procedure. Previous imaging reviewed. Under sterile conditions and local anesthesia, ultrasound micropuncture needle access performed of the patent right common femoral vein. Images obtained for documentation of the patent right common femoral vein. Transitional  dilator advanced followed by guidewire  exchanged for a Amplatz guidewire. Transitional dilatation performed to insert the 16 [place] Gore dry seal sheath (33 cm length). This was position in the suprarenal IVC. Angled pulmonary artery catheter was utilized to navigate through the right heart initially into the left pulmonary artery. Left pulmonary angiogram performed. Bulky central nearly occlusive branching filling defects present in the left pulmonary artery vasculature. Initial central pulmonary artery pressure measurement quickly obtained: 32/8 (16). Amplatze guidewire advanced into the left lower lobe pulmonary artery. The 15 French penumbra thrombectomy device was advanced easily over the Amplatz guidewire into the hilar pulmonary artery. Position confirmed with fluoroscopy. Images obtained for documentation. Suction thrombectomy performed yielding a moderate volume of fresh clot fragments. At one point, the penumbra 16 French catheter was removed while under suction for removal of additional clot. Pulmonary artery catheter had to be readvanced and navigated through the right heart again to access the right pulmonary artery vasculature. Right pulmonary angiogram performed. Similar central hilar branching filling defects present into the right lower lobe pulmonary arteries. This correlates with the CTA. Amplatz guidewire inserted. Penumbra device tracted easily over the Amplatz guidewire into the right hilar pulmonary vasculature. Suction thrombectomy performed with micro advancements and retractions to remove thrombus. At 1 point, the suction catheter was occluded with thrombus and had to be removed over a guidewire to completely remove additional clot. Following clot removal, repeat right central pulmonary angiogram demonstrates significant reduction in thrombus burden with improved right pulmonary artery flow. Repeat left pulmonary angiogram also performed by manipulating the penumbra catheter back to the left hilar branches. This confirms  residual filling defect/thrombus. Additional suction thrombectomy performed to remove more acute clot from the central left pulmonary vasculature. Repeat injection of the left pulmonary arteries confirms improvement in the clot burden and vascular flow. At this point, post thrombectomy pressure measurements obtained: 19/3 (7). Access removed. Hemostasis obtained with a pursestring suture at the right common femoral access site. ACT monitor during the procedure with a goal of 250 which required 6000 units of IV heparin during the case. Completion ACT sheath removal was 200. FINDINGS: Imaging confirms improvement in the overall bilateral pulmonary artery clot burden following suction thrombectomy. IMPRESSION: Successful bilateral acute pulmonary emboli suction thrombectomy with the 16 French penumbra device. Reduction in pulmonary artery pressure from 32/8 (16) to 19/3 (7) Electronically Signed   By: Jerilynn Mages.  Shick M.D.   On: 02/18/2022 13:21   IR THROMBECT PRIM MECH INIT (INCLU) MOD SED  Result Date: 02/18/2022 INDICATION: Acute sub massive bilateral central pulmonary emboli with evidence of right heart strain by echo, troponin leak, and oxygen requirement. EXAM: ULTRASOUND GUIDANCE FOR VASCULAR ACCESS BILATERAL PULMONARY ARTERY CATHETERIZATIONS AND ANGIOGRAMS BILATERAL PULMONARY EMBOLI SUCTION THROMBECTOMY WITH THE PENUMBRA 16 FRENCH DEVICE COMPARISON:  02/17/2022 MEDICATIONS: 1% lidocaine local, 6000 units heparin, 250 mg normal saline bolus ANESTHESIA/SEDATION: Moderate (conscious) sedation was employed during this procedure. A total of Versed 2.5 mg and Fentanyl 75 mcg was administered intravenously by the radiology nurse. Total intra-service moderate Sedation Time: 101 minutes. The patient's level of consciousness and vital signs were monitored continuously by radiology nursing throughout the procedure under my direct supervision. FLUOROSCOPY: Radiation Exposure Index (as provided by the fluoroscopic device): 242  mGy Kerma COMPLICATIONS: None immediate. TECHNIQUE: Informed written consent was obtained from the patient after a thorough discussion of the procedural risks, benefits and alternatives. All questions were addressed. Maximal Sterile Barrier Technique was utilized including caps, mask, sterile gowns, sterile gloves, sterile  drape, hand hygiene and skin antiseptic. A timeout was performed prior to the initiation of the procedure. Previous imaging reviewed. Under sterile conditions and local anesthesia, ultrasound micropuncture needle access performed of the patent right common femoral vein. Images obtained for documentation of the patent right common femoral vein. Transitional dilator advanced followed by guidewire exchanged for a Amplatz guidewire. Transitional dilatation performed to insert the 16 [place] Gore dry seal sheath (33 cm length). This was position in the suprarenal IVC. Angled pulmonary artery catheter was utilized to navigate through the right heart initially into the left pulmonary artery. Left pulmonary angiogram performed. Bulky central nearly occlusive branching filling defects present in the left pulmonary artery vasculature. Initial central pulmonary artery pressure measurement quickly obtained: 32/8 (16). Amplatze guidewire advanced into the left lower lobe pulmonary artery. The 35 French penumbra thrombectomy device was advanced easily over the Amplatz guidewire into the hilar pulmonary artery. Position confirmed with fluoroscopy. Images obtained for documentation. Suction thrombectomy performed yielding a moderate volume of fresh clot fragments. At one point, the penumbra 16 French catheter was removed while under suction for removal of additional clot. Pulmonary artery catheter had to be readvanced and navigated through the right heart again to access the right pulmonary artery vasculature. Right pulmonary angiogram performed. Similar central hilar branching filling defects present into the  right lower lobe pulmonary arteries. This correlates with the CTA. Amplatz guidewire inserted. Penumbra device tracted easily over the Amplatz guidewire into the right hilar pulmonary vasculature. Suction thrombectomy performed with micro advancements and retractions to remove thrombus. At 1 point, the suction catheter was occluded with thrombus and had to be removed over a guidewire to completely remove additional clot. Following clot removal, repeat right central pulmonary angiogram demonstrates significant reduction in thrombus burden with improved right pulmonary artery flow. Repeat left pulmonary angiogram also performed by manipulating the penumbra catheter back to the left hilar branches. This confirms residual filling defect/thrombus. Additional suction thrombectomy performed to remove more acute clot from the central left pulmonary vasculature. Repeat injection of the left pulmonary arteries confirms improvement in the clot burden and vascular flow. At this point, post thrombectomy pressure measurements obtained: 19/3 (7). Access removed. Hemostasis obtained with a pursestring suture at the right common femoral access site. ACT monitor during the procedure with a goal of 250 which required 6000 units of IV heparin during the case. Completion ACT sheath removal was 200. FINDINGS: Imaging confirms improvement in the overall bilateral pulmonary artery clot burden following suction thrombectomy. IMPRESSION: Successful bilateral acute pulmonary emboli suction thrombectomy with the 16 French penumbra device. Reduction in pulmonary artery pressure from 32/8 (16) to 19/3 (7) Electronically Signed   By: Jerilynn Mages.  Shick M.D.   On: 02/18/2022 13:21   IR US Guide Vasc Access Right  Result Date: 02/18/2022 INDICATION: Acute sub massive bilateral central pulmonary emboli with evidence of right heart strain by echo, troponin leak, and oxygen requirement. EXAM: ULTRASOUND GUIDANCE FOR VASCULAR ACCESS BILATERAL PULMONARY  ARTERY CATHETERIZATIONS AND ANGIOGRAMS BILATERAL PULMONARY EMBOLI SUCTION THROMBECTOMY WITH THE PENUMBRA 16 FRENCH DEVICE COMPARISON:  02/17/2022 MEDICATIONS: 1% lidocaine local, 6000 units heparin, 250 mg normal saline bolus ANESTHESIA/SEDATION: Moderate (conscious) sedation was employed during this procedure. A total of Versed 2.5 mg and Fentanyl 75 mcg was administered intravenously by the radiology nurse. Total intra-service moderate Sedation Time: 101 minutes. The patient's level of consciousness and vital signs were monitored continuously by radiology nursing throughout the procedure under my direct supervision. FLUOROSCOPY: Radiation Exposure Index (as provided by  the fluoroscopic device): 607 mGy Kerma COMPLICATIONS: None immediate. TECHNIQUE: Informed written consent was obtained from the patient after a thorough discussion of the procedural risks, benefits and alternatives. All questions were addressed. Maximal Sterile Barrier Technique was utilized including caps, mask, sterile gowns, sterile gloves, sterile drape, hand hygiene and skin antiseptic. A timeout was performed prior to the initiation of the procedure. Previous imaging reviewed. Under sterile conditions and local anesthesia, ultrasound micropuncture needle access performed of the patent right common femoral vein. Images obtained for documentation of the patent right common femoral vein. Transitional dilator advanced followed by guidewire exchanged for a Amplatz guidewire. Transitional dilatation performed to insert the 16 [place] Gore dry seal sheath (33 cm length). This was position in the suprarenal IVC. Angled pulmonary artery catheter was utilized to navigate through the right heart initially into the left pulmonary artery. Left pulmonary angiogram performed. Bulky central nearly occlusive branching filling defects present in the left pulmonary artery vasculature. Initial central pulmonary artery pressure measurement quickly obtained: 32/8  (16). Amplatze guidewire advanced into the left lower lobe pulmonary artery. The 70 French penumbra thrombectomy device was advanced easily over the Amplatz guidewire into the hilar pulmonary artery. Position confirmed with fluoroscopy. Images obtained for documentation. Suction thrombectomy performed yielding a moderate volume of fresh clot fragments. At one point, the penumbra 16 French catheter was removed while under suction for removal of additional clot. Pulmonary artery catheter had to be readvanced and navigated through the right heart again to access the right pulmonary artery vasculature. Right pulmonary angiogram performed. Similar central hilar branching filling defects present into the right lower lobe pulmonary arteries. This correlates with the CTA. Amplatz guidewire inserted. Penumbra device tracted easily over the Amplatz guidewire into the right hilar pulmonary vasculature. Suction thrombectomy performed with micro advancements and retractions to remove thrombus. At 1 point, the suction catheter was occluded with thrombus and had to be removed over a guidewire to completely remove additional clot. Following clot removal, repeat right central pulmonary angiogram demonstrates significant reduction in thrombus burden with improved right pulmonary artery flow. Repeat left pulmonary angiogram also performed by manipulating the penumbra catheter back to the left hilar branches. This confirms residual filling defect/thrombus. Additional suction thrombectomy performed to remove more acute clot from the central left pulmonary vasculature. Repeat injection of the left pulmonary arteries confirms improvement in the clot burden and vascular flow. At this point, post thrombectomy pressure measurements obtained: 19/3 (7). Access removed. Hemostasis obtained with a pursestring suture at the right common femoral access site. ACT monitor during the procedure with a goal of 250 which required 6000 units of IV  heparin during the case. Completion ACT sheath removal was 200. FINDINGS: Imaging confirms improvement in the overall bilateral pulmonary artery clot burden following suction thrombectomy. IMPRESSION: Successful bilateral acute pulmonary emboli suction thrombectomy with the 16 French penumbra device. Reduction in pulmonary artery pressure from 32/8 (16) to 19/3 (7) Electronically Signed   By: Jerilynn Mages.  Shick M.D.   On: 02/18/2022 13:21   DG Chest Port 1 View  Result Date: 02/17/2022 CLINICAL DATA:  Concern for sepsis EXAM: PORTABLE CHEST 1 VIEW COMPARISON:  01/09/2022 FINDINGS: Cardiac and mediastinal contours are within normal limits. Redemonstrated right chest port with catheter tip in the low SVC. No focal pulmonary opacity. No pleural effusion or pneumothorax. No acute osseous abnormality. Degenerative changes in the bilateral shoulders. IMPRESSION: No acute cardiopulmonary process. Electronically Signed   By: Merilyn Baba M.D.   On: 02/17/2022 22:03  ECHOCARDIOGRAM COMPLETE  Result Date: 02/18/2022    ECHOCARDIOGRAM REPORT   Patient Name:   WINTON OFFORD [facility] Date of Exam: 02/18/2022 Medical Rec #:  [id number]               Height:       72.0 in Accession #:    [id number]              Weight:       155.2 lb Date of Birth:  1946/07/07               BSA:          1.913 m Patient Age:    6 years                BP:           119/66 mmHg Patient Gender: M                       HR:           90 bpm. Exam Location:  Inpatient Procedure: 2D Echo, Cardiac Doppler, Color Doppler and Strain Analysis                STAT ECHO  Dr Tamala Julian and [facility] both have seen echo. Indications:    Pulmonary embolus  History:        Patient has prior history of Echocardiogram examinations, most                 recent 10/09/2021. COPD; Signs/Symptoms:Chest Pain.  Sonographer:    Luisa Hart RDCS Referring Phys: [id number] RAHUL P DESAI IMPRESSIONS  1. Left ventricular ejection fraction, by estimation, is 60 to 65%. The left ventricle  has normal function. The left ventricle has no regional wall motion abnormalities. Left ventricular diastolic parameters are consistent with Grade I diastolic dysfunction (impaired relaxation). There is the interventricular septum is flattened in systole and diastole, consistent with right ventricular pressure and volume overload.  2. Right ventricular systolic function is severely reduced. The right ventricular size is moderately enlarged. There is mildly elevated pulmonary artery systolic pressure. The estimated right ventricular systolic pressure is 41.9 mmHg.  3. Right atrial size was moderately dilated.  4. The mitral valve is normal in structure. No evidence of mitral valve regurgitation. No evidence of mitral stenosis.  5. Tricuspid valve regurgitation is moderate.  6. The aortic valve is normal in structure. There is mild calcification of the aortic valve. There is mild thickening of the aortic valve. Aortic valve regurgitation is trivial. Aortic valve sclerosis is present, with no evidence of aortic valve stenosis. Aortic valve mean gradient measures 2.0 mmHg. Aortic valve Vmax measures 1.16 m/s.  7. The inferior vena cava is dilated in size with >50% respiratory variability, suggesting right atrial pressure of 8 mmHg. Comparison(s): Prior images reviewed side by side. RV function and size has worsened when compared to prior ECHO. FINDINGS  Left Ventricle: Left ventricular ejection fraction, by estimation, is 60 to 65%. The left ventricle has normal function. The left ventricle has no regional wall motion abnormalities. Global longitudinal strain performed but not reported based on interpreter judgement due to suboptimal tracking. The left ventricular internal cavity size was normal in size. There is no left ventricular hypertrophy. The interventricular septum is flattened in systole and diastole, consistent with right ventricular pressure and volume overload. Left ventricular diastolic parameters are  consistent with Grade I diastolic dysfunction (impaired relaxation). Right Ventricle: The right  ventricular size is moderately enlarged. No increase in right ventricular wall thickness. Right ventricular systolic function is severely reduced. There is mildly elevated pulmonary artery systolic pressure. The tricuspid regurgitant velocity is 2.81 m/s, and with an assumed right atrial pressure of 8 mmHg, the estimated right ventricular systolic pressure is 76.7 mmHg. Left Atrium: Left atrial size was normal in size. Right Atrium: Right atrial size was moderately dilated. Pericardium: There is no evidence of pericardial effusion. Mitral Valve: The mitral valve is normal in structure. No evidence of mitral valve regurgitation. No evidence of mitral valve stenosis. Tricuspid Valve: The tricuspid valve is normal in structure. Tricuspid valve regurgitation is moderate . No evidence of tricuspid stenosis. Aortic Valve: The aortic valve is normal in structure. There is mild calcification of the aortic valve. There is mild thickening of the aortic valve. Aortic valve regurgitation is trivial. Aortic valve sclerosis is present, with no evidence of aortic valve stenosis. Aortic valve mean gradient measures 2.0 mmHg. Aortic valve peak gradient measures 5.4 mmHg. Aortic valve area, by VTI measures 3.23 cm. Pulmonic Valve: The pulmonic valve was normal in structure. Pulmonic valve regurgitation is not visualized. No evidence of pulmonic stenosis. Aorta: The aortic root is normal in size and structure. Venous: The inferior vena cava is dilated in size with greater than 50% respiratory variability, suggesting right atrial pressure of 8 mmHg. IAS/Shunts: No atrial level shunt detected by color flow Doppler.  LEFT VENTRICLE PLAX 2D LVIDd:         4.20 cm     Diastology LVIDs:         2.60 cm     LV e' medial:    5.66 cm/s LV PW:         0.80 cm     LV E/e' medial:  7.9 LV IVS:        1.10 cm     LV e' lateral:   7.83 cm/s LVOT diam:      2.30 cm     LV E/e' lateral: 5.7 LV SV:         49 LV SV Index:   26 LVOT Area:     4.15 cm  LV Volumes (MOD) LV vol d, MOD A2C: 68.8 ml LV vol d, MOD A4C: 52.2 ml LV vol s, MOD A2C: 19.0 ml LV vol s, MOD A4C: 29.0 ml LV SV MOD A2C:     49.8 ml LV SV MOD A4C:     52.2 ml LV SV MOD BP:      35.9 ml RIGHT VENTRICLE RV Basal diam:  4.80 cm RV Mid diam:    4.80 cm RV S prime:     11.00 cm/s TAPSE (M-mode): 1.6 cm LEFT ATRIUM             Index        RIGHT ATRIUM           Index LA diam:        2.60 cm 1.36 cm/m   RA Area:     23.60 cm LA Vol (A2C):   18.3 ml 9.57 ml/m   RA Volume:   79.00 ml  41.30 ml/m LA Vol (A4C):   20.3 ml 10.61 ml/m LA Biplane Vol: 20.5 ml 10.72 ml/m  AORTIC VALVE                    PULMONIC VALVE AV Area (Vmax):    3.55 cm     PV Vmax:  1.06 m/s AV Area (Vmean):   2.98 cm     PV Vmean:         65.300 cm/s AV Area (VTI):     3.23 cm     PV VTI:           0.136 m AV Vmax:           116.00 cm/s  PV Peak grad:     4.5 mmHg AV Vmean:          71.200 cm/s  PV Mean grad:     2.0 mmHg AV VTI:            0.152 m      PR End Diast Vel: 3.21 msec AV Peak Grad:      5.4 mmHg AV Mean Grad:      2.0 mmHg LVOT Vmax:         99.20 cm/s LVOT Vmean:        51.100 cm/s LVOT VTI:          0.118 m LVOT/AV VTI ratio: 0.78  AORTA Ao Asc diam: 3.50 cm MITRAL VALVE               TRICUSPID VALVE MV Area (PHT): 3.79 cm    TR Peak grad:   31.6 mmHg MV Decel Time: 200 msec    TR Vmax:        281.00 cm/s MV E velocity: 44.60 cm/s                            SHUNTS                            Systemic VTI:  0.12 m                            Systemic Diam: 2.30 cm Candee Furbish MD Electronically signed by Candee Furbish MD Signature Date/Time: 02/18/2022/8:36:35 AM    Final    VAS [place] LOWER EXTREMITY VENOUS (DVT)  Result Date: 02/18/2022  Lower Venous DVT Study Patient Name:  KEVORK JOYCE [facility]  Date of Exam:   02/18/2022 Medical Rec #: [id number]                Accession #:    [id number] Date of Birth:  1946/03/09                Patient Gender: M Patient Age:   36 years Exam Location:  [facility] Procedure:      VAS [place] LOWER EXTREMITY VENOUS (DVT) Referring Phys: Montey Hora --------------------------------------------------------------------------------  Indications: Pulmonary embolism.  Risk Factors: Confirmed PE DVT. Anticoagulation: Heparin. Limitations: Poor ultrasound/tissue interface and bandages. Comparison Study: 02/12/2022 - RIGHT:                   - No evidence of deep vein thrombosis in the lower extremity.                   No indirect                   evidence of obstruction proximal to the inguinal ligament.                    LEFT:                   -  There is no evidence of deep vein thrombosis in the lower                   extremity.                   - There is no evidence of superficial venous thrombosis.                    - No cystic structure found in the popliteal fossa.                   - Limited imaging suggests extrinsic compression/ obstruction                   of left                   common iliac vein.                   - Possible obstruction proximal to the inguinal ligament. Performing Technologist: Oliver Hum RVT  Examination Guidelines: A complete evaluation includes B-mode imaging, spectral Doppler, color Doppler, and power Doppler as needed of all accessible portions of each vessel. Bilateral testing is considered an integral part of a complete examination. Limited examinations for reoccurring indications may be performed as noted. The reflux portion of the exam is performed with the patient in reverse Trendelenburg.  +---------+---------------+---------+-----------+----------+-------------------+  RIGHT     Compressibility Phasicity Spontaneity Properties Thrombus Aging       +---------+---------------+---------+-----------+----------+-------------------+  CFV                                                        Not well visualized   +---------+---------------+---------+-----------+----------+-------------------+  SFJ                                                        Not well visualized  +---------+---------------+---------+-----------+----------+-------------------+  FV Prox   Full            Yes       Yes                                         +---------+---------------+---------+-----------+----------+-------------------+  FV Mid    Full                                                                  +---------+---------------+---------+-----------+----------+-------------------+  FV Distal Full                                                                  +---------+---------------+---------+-----------+----------+-------------------+  PFV  Full            Yes       Yes                                         +---------+---------------+---------+-----------+----------+-------------------+  POP       Full            Yes       Yes                                         +---------+---------------+---------+-----------+----------+-------------------+  PTV       Full                                                                  +---------+---------------+---------+-----------+----------+-------------------+  PERO      Full                                                                  +---------+---------------+---------+-----------+----------+-------------------+   +---------+---------------+---------+-----------+----------+-------------------+  LEFT      Compressibility Phasicity Spontaneity Properties Thrombus Aging       +---------+---------------+---------+-----------+----------+-------------------+  CFV       Partial         No        No                     Acute                +---------+---------------+---------+-----------+----------+-------------------+  SFJ       Full                                                                  +---------+---------------+---------+-----------+----------+-------------------+  FV  Prox   Partial         No        No                     Acute                +---------+---------------+---------+-----------+----------+-------------------+  FV Mid    Partial         No        No                     Acute                +---------+---------------+---------+-----------+----------+-------------------+  FV Distal Partial         No        No  Acute                +---------+---------------+---------+-----------+----------+-------------------+  PFV       Partial         No        No                     Acute                +---------+---------------+---------+-----------+----------+-------------------+  POP       Full            No        No                                          +---------+---------------+---------+-----------+----------+-------------------+  PTV       Full                                                                  +---------+---------------+---------+-----------+----------+-------------------+  PERO                                                       Not well visualized  +---------+---------------+---------+-----------+----------+-------------------+  EIV       Partial         No        No                     Acute                +---------+---------------+---------+-----------+----------+-------------------+  CIV                       Yes       Yes                                         +---------+---------------+---------+-----------+----------+-------------------+ The CIV, and distal IVC appears patent.   Summary: RIGHT: - There is no evidence of deep vein thrombosis in the lower extremity. However, portions of this examination were limited- see technologist comments above.  - No cystic structure found in the popliteal fossa.  LEFT: - Findings consistent with acute deep vein thrombosis involving the left external ilic vein, left common femoral vein, left femoral vein, and left proximal profunda vein. - No cystic structure found in the popliteal  fossa.  *See table(s) above for measurements and observations.    Preliminary     Procedures Procedures    Medications Ordered in ED Medications  lactated ringers infusion ( Intravenous Infusion Verify 02/18/22 1400)  heparin ADULT infusion 100 units/mL (25000 units/252mL) (1,200 Units/hr Intravenous Infusion Verify 02/18/22 1400)  docusate sodium (COLACE) capsule 100 mg (has no administration in time range)  polyethylene glycol (MIRALAX / GLYCOLAX) packet 17 g (has no administration in time range)  budesonide (PULMICORT) nebulizer solution 0.5 mg (0.5 mg Nebulization Given 02/18/22 0759)  arformoterol (  BROVANA) nebulizer solution 15 mcg (15 mcg Nebulization Given 02/18/22 0759)  levalbuterol (XOPENEX) nebulizer solution 0.63 mg (has no administration in time range)  cefTRIAXone (ROCEPHIN) 2 g in sodium chloride 0.9 % 100 mL IVPB (has no administration in time range)  Chlorhexidine Gluconate Cloth 2 % PADS 6 each (6 each Topical Given 02/18/22 0900)  sodium chloride flush (NS) 0.9 % injection 10-40 mL (has no administration in time range)  lidocaine (XYLOCAINE) 1 % (with pres) injection (has no administration in time range)  iohexol (OMNIPAQUE) 300 MG/ML solution 100 mL (has no administration in time range)  lactated ringers bolus 1,000 mL (0 mLs Intravenous Stopped 02/17/22 2222)  lactated ringers bolus 1,000 mL (0 mLs Intravenous Stopped 02/18/22 0021)    And  lactated ringers bolus 250 mL (0 mLs Intravenous Stopped 02/18/22 0059)  iohexol (OMNIPAQUE) 350 MG/ML injection 79 mL (79 mLs Intravenous Contrast Given 02/17/22 2352)  heparin bolus via infusion 4,000 Units (4,000 Units Intravenous Bolus from Bag 02/18/22 0216)  cefTRIAXone (ROCEPHIN) 1 g in sodium chloride 0.9 % 100 mL IVPB (0 g Intravenous Stopped 02/18/22 1015)  midazolam (VERSED) injection (0.5 mg Intravenous Given 02/18/22 1145)  fentaNYL (SUBLIMAZE) injection (25 mcg Intravenous Given 02/18/22 1145)  heparin sodium (porcine) injection  (3,000 Units Intravenous Given 02/18/22 1138)  sodium chloride 0.9 % bolus (0 mLs  Stopped 02/18/22 1126)  iohexol (OMNIPAQUE) 300 MG/ML solution 100 mL (76 mLs Intravenous Contrast Given 02/18/22 1215)    ED Course/ Medical Decision Making/ A&P                           Medical Decision Making Amount and/or Complexity of Data Reviewed Labs: ordered. Radiology: ordered. ECG/medicine tests: ordered.  Risk Prescription drug management. Decision regarding hospitalization.   This patient presents to the ED for concern of generalized weakness, this involves an extensive number of treatment options, and is a complaint that carries with it a high risk of complications and morbidity.  The differential diagnosis includes sepsis, PE, dehydration, CHF, viral illness   Co morbidities that complicate the patient evaluation  metastatic prostate cancer, COPD, nephrostomy tubes   Additional history obtained:  Additional history obtained from patient's wife External records from outside source obtained and reviewed including EMR   Lab Tests:  I Ordered, and personally interpreted labs.  The pertinent results include: Leukocytosis, lactic acidosis, elevated troponin, elevated D-dimer   Imaging Studies ordered:  I ordered imaging studies including chest x-ray, CTA chest I independently visualized and interpreted imaging which showed no findings on chest x-ray; on CTA chest, patient is found to have extensive bilateral pulmonary emboli with evidence of right heart strain. I agree with the radiologist interpretation   Cardiac Monitoring:  The patient was maintained on a cardiac monitor.  I personally viewed and interpreted the cardiac monitored which showed an underlying rhythm of: Sinus rhythm   Medicines ordered and prescription drug management:  I ordered medication including IV fluids and broad-spectrum antibiotics for empiric treatment of sepsis; heparin for extensive PEs; Levophed  for hypotension Reevaluation of the patient after these medicines showed that the patient improved I have reviewed the patients home medicines and have made adjustments as needed    Critical Interventions:  Treatment of sepsis, heparinization for extensive PEs, consultation with critical care   Consultations Obtained:  I requested consultation with the critical care and interventional radiology,  and discussed lab and imaging findings as well as pertinent plan -  they recommend: Critical care recommends admission to ICU.  They also agree with consultation with interventional radiology for possible embolectomy and/or catheter directed therapy.  Callback from interventional radiology pending at time of signout.   Problem List / ED Course:  Patient is a very pleasant 76 year old male, currently undergoing chemotherapy for metastatic prostate cancer, presenting for neurolysed weakness.  Onset of this weakness was today.  He has had recent diffuse swelling of his left lower extremity.  He was has been evaluated for this and does state that he underwent a DVT ultrasound which he was told was negative.  Left leg swelling has been ongoing for the past several weeks.  On arrival in the ED, patient is tachycardic and hypotensive.  He is on 5 L of supplemental oxygen with SPO2 of 91%.  At baseline, he does not use supplemental oxygen.  He is ill-appearing.  Rectal temperature did reveal a low-grade fever.  Laboratory work-up was initiated.  Patient was given initial 1 L bolus of IV fluids.  Lab work shows leukocytosis and lactic acidosis.  He was given additional IV fluids as well as broad-spectrum antibiotics for empiric treatment of sepsis.  Fortunately, patient has a normal kidney function.  He underwent a CTA of chest which revealed extensive bilateral pulmonary emboli.  I believe this is the primary cause of the patient's symptoms.  Heparinization was started.  Consultation was ordered with critical care  as well as interventional radiology.  Critical care evaluated the patient in the ED and agrees with ICU admission.  Callback from interventional radiology pending at time of signout.  Following IV fluid resuscitation, patient had improved symptoms.  His blood pressures were able to be maintained with maps above 70 without vasopressor medications.  Levophed was ordered as needed for worsening hypotension.  Patient's heart rate improved with IV fluids.  Supplemental oxygen was able to be weaned down to 3 L.  His work of breathing improved.  I spoke with patient regarding CODE STATUS.  At this time, patient states that he would like to be DNR/DNI.  Patient was admitted to ICU.   Reevaluation:  After the interventions noted above, I reevaluated the patient and found that they have :improved   Social Determinants of Health:  Metastatic cancer   Dispostion:  After consideration of the diagnostic results and the patients response to treatment, I feel that the patent would benefit from admission to ICU.  CRITICAL CARE Performed by: Godfrey Pick   Total critical care time: 45 minutes  Critical care time was exclusive of separately billable procedures and treating other patients.  Critical care was necessary to treat or prevent imminent or life-threatening deterioration.  Critical care was time spent personally by me on the following activities: development of treatment plan with patient and/or surrogate as well as nursing, discussions with consultants, evaluation of patient's response to treatment, examination of patient, obtaining history from patient or surrogate, ordering and performing treatments and interventions, ordering and review of laboratory studies, ordering and review of radiographic studies, pulse oximetry and re-evaluation of patient's condition.           Final Clinical Impression(s) / ED Diagnoses Final diagnoses:  Acute pulmonary embolism with acute cor pulmonale,  unspecified pulmonary embolism type (HCC)  Lactic acidosis  Hypotension, unspecified hypotension type  Acute respiratory failure with hypoxia [facility])    Rx / DC Orders ED Discharge Orders     None         Godfrey Pick, MD 02/18/22  1438 ° °

## 2022-02-17 NOTE — ED Notes (Signed)
Lab called, troponin 636 and lactic acid 5.1. Dr. Doren Custard made aware.

## 2022-02-17 NOTE — ED Triage Notes (Signed)
Pt brought in by EMS for generalized weakness, poor appetite, dizziness and hypotension. Per crew, pt's initial blood pressure was 60 palpated and HR 130. Pt also c/o vomiting and shortness of breath. Pt has a history of prostate cancer. Pt has nephrostomy tubes with urine bag in place.

## 2022-02-17 NOTE — Progress Notes (Signed)
Pharmacy Antibiotic Note  Jose Wolfe is a 76 y.o. male admitted on 02/17/2022 with  infection of unknown source .  Pharmacy has been consulted for vancomycin and cefepime dosing.  Plan: Cefepime 2g q12h Vancomycin 1500 mg x1, then 1250 mg q24h (eAUC=468.5, SCr used 1.06) Trend WBC, temp, renal function  F/U infectious work-up Drug levels as indicated  Height: 6' (182.9 cm) Weight: 70.4 kg (155 lb 3.3 oz) IBW/kg (Calculated) : 77.6  Temp (24hrs), Avg:99.5 F (37.5 C), Min:98.1 F (36.7 C), Max:100.8 F (38.2 C)  Recent Labs  Lab 02/17/22 2140  WBC 16.9*  CREATININE 1.06  LATICACIDVEN 5.1*    Estimated Creatinine Clearance: 58.1 mL/min (by C-G formula based on SCr of 1.06 mg/dL).    Allergies  Allergen Reactions   Atorvastatin     Other reaction(s): Myalgias    Antimicrobials this admission: Vancomycin 2/21 >>  Cefepime 2/21 >>  Flagyl 2/21 >>   Dose adjustments this admission: None  Microbiology results: Pending  Thank you for allowing pharmacy to be a part of this patients care.  Joseph Art, Pharm.D. PGY-1 Pharmacy Resident [phone] 02/17/2022 11:14 PM

## 2022-02-17 NOTE — Sepsis Progress Note (Signed)
Sepsis protocol is being followed by [facility]. 

## 2022-02-18 ENCOUNTER — Inpatient Hospital Stay: Payer: Medicare HMO | Admitting: Oncology

## 2022-02-18 ENCOUNTER — Inpatient Hospital Stay (HOSPITAL_COMMUNITY): Payer: Medicare HMO

## 2022-02-18 ENCOUNTER — Inpatient Hospital Stay: Payer: Medicare HMO

## 2022-02-18 ENCOUNTER — Encounter: Payer: Self-pay | Admitting: Oncology

## 2022-02-18 ENCOUNTER — Other Ambulatory Visit (HOSPITAL_COMMUNITY): Payer: Self-pay

## 2022-02-18 ENCOUNTER — Encounter (HOSPITAL_COMMUNITY): Payer: Self-pay | Admitting: Pulmonary Disease

## 2022-02-18 DIAGNOSIS — C61 Malignant neoplasm of prostate: Secondary | ICD-10-CM | POA: Diagnosis not present

## 2022-02-18 DIAGNOSIS — Y92002 Bathroom of unspecified non-institutional (private) residence single-family (private) house as the place of occurrence of the external cause: Secondary | ICD-10-CM | POA: Diagnosis not present

## 2022-02-18 DIAGNOSIS — I2694 Multiple subsegmental pulmonary emboli without acute cor pulmonale: Secondary | ICD-10-CM | POA: Diagnosis not present

## 2022-02-18 DIAGNOSIS — I82412 Acute embolism and thrombosis of left femoral vein: Secondary | ICD-10-CM | POA: Diagnosis not present

## 2022-02-18 DIAGNOSIS — A419 Sepsis, unspecified organism: Secondary | ICD-10-CM | POA: Insufficient documentation

## 2022-02-18 DIAGNOSIS — I2699 Other pulmonary embolism without acute cor pulmonale: Secondary | ICD-10-CM | POA: Diagnosis present

## 2022-02-18 DIAGNOSIS — Z9889 Other specified postprocedural states: Secondary | ICD-10-CM | POA: Diagnosis not present

## 2022-02-18 DIAGNOSIS — Z66 Do not resuscitate: Secondary | ICD-10-CM | POA: Insufficient documentation

## 2022-02-18 DIAGNOSIS — E872 Acidosis, unspecified: Secondary | ICD-10-CM | POA: Diagnosis present

## 2022-02-18 DIAGNOSIS — R531 Weakness: Secondary | ICD-10-CM | POA: Diagnosis not present

## 2022-02-18 DIAGNOSIS — I2609 Other pulmonary embolism with acute cor pulmonale: Secondary | ICD-10-CM

## 2022-02-18 DIAGNOSIS — N139 Obstructive and reflux uropathy, unspecified: Secondary | ICD-10-CM | POA: Diagnosis present

## 2022-02-18 DIAGNOSIS — I4891 Unspecified atrial fibrillation: Secondary | ICD-10-CM | POA: Diagnosis present

## 2022-02-18 DIAGNOSIS — R7989 Other specified abnormal findings of blood chemistry: Secondary | ICD-10-CM | POA: Diagnosis not present

## 2022-02-18 DIAGNOSIS — N179 Acute kidney failure, unspecified: Secondary | ICD-10-CM | POA: Diagnosis not present

## 2022-02-18 DIAGNOSIS — I82409 Acute embolism and thrombosis of unspecified deep veins of unspecified lower extremity: Secondary | ICD-10-CM | POA: Diagnosis not present

## 2022-02-18 DIAGNOSIS — E43 Unspecified severe protein-calorie malnutrition: Secondary | ICD-10-CM | POA: Diagnosis not present

## 2022-02-18 DIAGNOSIS — E871 Hypo-osmolality and hyponatremia: Secondary | ICD-10-CM | POA: Diagnosis present

## 2022-02-18 DIAGNOSIS — W1811XA Fall from or off toilet without subsequent striking against object, initial encounter: Secondary | ICD-10-CM | POA: Diagnosis not present

## 2022-02-18 DIAGNOSIS — R6521 Severe sepsis with septic shock: Secondary | ICD-10-CM | POA: Diagnosis not present

## 2022-02-18 DIAGNOSIS — I824Y2 Acute embolism and thrombosis of unspecified deep veins of left proximal lower extremity: Secondary | ICD-10-CM | POA: Diagnosis present

## 2022-02-18 DIAGNOSIS — I82422 Acute embolism and thrombosis of left iliac vein: Secondary | ICD-10-CM | POA: Diagnosis present

## 2022-02-18 DIAGNOSIS — R0902 Hypoxemia: Secondary | ICD-10-CM | POA: Diagnosis not present

## 2022-02-18 DIAGNOSIS — I071 Rheumatic tricuspid insufficiency: Secondary | ICD-10-CM | POA: Diagnosis present

## 2022-02-18 DIAGNOSIS — R7881 Bacteremia: Secondary | ICD-10-CM | POA: Diagnosis not present

## 2022-02-18 DIAGNOSIS — A411 Sepsis due to other specified staphylococcus: Secondary | ICD-10-CM | POA: Diagnosis not present

## 2022-02-18 DIAGNOSIS — N390 Urinary tract infection, site not specified: Secondary | ICD-10-CM | POA: Diagnosis present

## 2022-02-18 DIAGNOSIS — Z95828 Presence of other vascular implants and grafts: Secondary | ICD-10-CM | POA: Diagnosis not present

## 2022-02-18 DIAGNOSIS — Z1611 Resistance to penicillins: Secondary | ICD-10-CM | POA: Diagnosis present

## 2022-02-18 DIAGNOSIS — Z6821 Body mass index (BMI) 21.0-21.9, adult: Secondary | ICD-10-CM | POA: Diagnosis not present

## 2022-02-18 DIAGNOSIS — I517 Cardiomegaly: Secondary | ICD-10-CM | POA: Diagnosis not present

## 2022-02-18 DIAGNOSIS — I959 Hypotension, unspecified: Secondary | ICD-10-CM | POA: Diagnosis not present

## 2022-02-18 DIAGNOSIS — C7951 Secondary malignant neoplasm of bone: Secondary | ICD-10-CM | POA: Diagnosis not present

## 2022-02-18 DIAGNOSIS — I2602 Saddle embolus of pulmonary artery with acute cor pulmonale: Secondary | ICD-10-CM | POA: Diagnosis not present

## 2022-02-18 DIAGNOSIS — J9601 Acute respiratory failure with hypoxia: Secondary | ICD-10-CM | POA: Diagnosis not present

## 2022-02-18 DIAGNOSIS — J9811 Atelectasis: Secondary | ICD-10-CM | POA: Diagnosis not present

## 2022-02-18 DIAGNOSIS — J449 Chronic obstructive pulmonary disease, unspecified: Secondary | ICD-10-CM | POA: Diagnosis present

## 2022-02-18 DIAGNOSIS — Z466 Encounter for fitting and adjustment of urinary device: Secondary | ICD-10-CM | POA: Diagnosis not present

## 2022-02-18 HISTORY — PX: IR THROMBECT PRIM MECH INIT (INCLU) MOD SED: IMG2297

## 2022-02-18 HISTORY — PX: IR US GUIDE VASC ACCESS RIGHT: IMG2390

## 2022-02-18 HISTORY — PX: IR ANGIOGRAM PULMONARY BILATERAL SELECTIVE: IMG664

## 2022-02-18 LAB — HEPARIN LEVEL (UNFRACTIONATED)
Heparin Unfractionated: 0.26 IU/mL — ABNORMAL LOW (ref 0.30–0.70)
Heparin Unfractionated: 0.69 IU/mL (ref 0.30–0.70)

## 2022-02-18 LAB — ECHOCARDIOGRAM COMPLETE
AR max vel: 3.55 cm2
AV Area VTI: 3.23 cm2
AV Area mean vel: 2.98 cm2
AV Mean grad: 2 mmHg
AV Peak grad: 5.4 mmHg
Ao pk vel: 1.16 m/s
Area-P 1/2: 3.79 cm2
Calc EF: 60 %
Height: 72 in
S' Lateral: 2.6 cm
Single Plane A2C EF: 72.4 %
Single Plane A4C EF: 44.4 %
Weight: 2483.26 oz

## 2022-02-18 LAB — CBC
HCT: 29.2 % — ABNORMAL LOW (ref 39.0–52.0)
Hemoglobin: 9.5 g/dL — ABNORMAL LOW (ref 13.0–17.0)
MCH: 30.2 pg (ref 26.0–34.0)
MCHC: 32.5 g/dL (ref 30.0–36.0)
MCV: 92.7 fL (ref 80.0–100.0)
Platelets: 289 10*3/uL (ref 150–400)
RBC: 3.15 MIL/uL — ABNORMAL LOW (ref 4.22–5.81)
RDW: 16.1 % — ABNORMAL HIGH (ref 11.5–15.5)
WBC: 15 10*3/uL — ABNORMAL HIGH (ref 4.0–10.5)
nRBC: 0 % (ref 0.0–0.2)

## 2022-02-18 LAB — MRSA NEXT GEN BY PCR, NASAL: MRSA by PCR Next Gen: NOT DETECTED

## 2022-02-18 LAB — BASIC METABOLIC PANEL
Anion gap: 11 (ref 5–15)
BUN: 14 mg/dL (ref 8–23)
CO2: 20 mmol/L — ABNORMAL LOW (ref 22–32)
Calcium: 7.4 mg/dL — ABNORMAL LOW (ref 8.9–10.3)
Chloride: 99 mmol/L (ref 98–111)
Creatinine, Ser: 0.97 mg/dL (ref 0.61–1.24)
GFR, Estimated: >60 mL/min (ref >60)
Glucose, Bld: 144 mg/dL — ABNORMAL HIGH (ref 70–99)
Potassium: 4.9 mmol/L (ref 3.5–5.1)
Sodium: 130 mmol/L — ABNORMAL LOW (ref 135–145)

## 2022-02-18 LAB — POCT ACTIVATED CLOTTING TIME
Activated Clotting Time: 167 seconds
Activated Clotting Time: 191 seconds
Activated Clotting Time: 215 seconds

## 2022-02-18 LAB — GLUCOSE, CAPILLARY
Glucose-Capillary: 133 mg/dL — ABNORMAL HIGH (ref 70–99)
Glucose-Capillary: 108 mg/dL — ABNORMAL HIGH (ref 70–99)

## 2022-02-18 LAB — PHOSPHORUS: Phosphorus: 2.7 mg/dL (ref 2.5–4.6)

## 2022-02-18 LAB — MAGNESIUM: Magnesium: 1.9 mg/dL (ref 1.7–2.4)

## 2022-02-18 LAB — TROPONIN I (HIGH SENSITIVITY)
Troponin I (High Sensitivity): 721 ng/L (ref <18)
Troponin I (High Sensitivity): 722 ng/L (ref <18)

## 2022-02-18 LAB — LACTIC ACID, PLASMA
Lactic Acid, Venous: 3.4 mmol/L (ref 0.5–1.9)
Lactic Acid, Venous: 4.4 mmol/L (ref 0.5–1.9)

## 2022-02-18 MED ORDER — SODIUM CHLORIDE 0.9 % IV SOLN
2.0000 g | INTRAVENOUS | Status: DC
  Administered 2022-02-19 – 2022-02-20 (×2): 2 g via INTRAVENOUS
  Filled 2022-02-18 (×3): qty 20

## 2022-02-18 MED ORDER — POLYETHYLENE GLYCOL 3350 17 G PO PACK
17.0000 g | PACK | Freq: Every day | ORAL | Status: DC | PRN
  Administered 2022-02-18: 17 g via ORAL
  Filled 2022-02-18 (×2): qty 1

## 2022-02-18 MED ORDER — LACTATED RINGERS IV BOLUS: 250.0000 mL | Freq: Once | INTRAVENOUS | Status: DC

## 2022-02-18 MED ORDER — LEVALBUTEROL HCL 0.63 MG/3ML IN NEBU: 0.6300 mg | INHALATION_SOLUTION | RESPIRATORY_TRACT | Status: DC | PRN

## 2022-02-18 MED ORDER — FENTANYL CITRATE (PF) 100 MCG/2ML IJ SOLN
INTRAMUSCULAR | Status: AC
  Filled 2022-02-18: qty 4

## 2022-02-18 MED ORDER — HEPARIN SODIUM (PORCINE) 1000 UNIT/ML IJ SOLN
INTRAMUSCULAR | Status: AC
  Filled 2022-02-18: qty 10

## 2022-02-18 MED ORDER — IOHEXOL 300 MG/ML  SOLN: 100.0000 mL | Freq: Once | INTRAMUSCULAR | Status: DC | PRN

## 2022-02-18 MED ORDER — SODIUM CHLORIDE 0.9 % IV BOLUS
INTRAVENOUS | Status: AC | PRN
  Administered 2022-02-18: 250 mL via INTRAVENOUS

## 2022-02-18 MED ORDER — MIDAZOLAM HCL 2 MG/2ML IJ SOLN
INTRAMUSCULAR | Status: AC
  Filled 2022-02-18: qty 4

## 2022-02-18 MED ORDER — FENTANYL CITRATE (PF) 100 MCG/2ML IJ SOLN
INTRAMUSCULAR | Status: AC | PRN
Start: 2022-02-18 — End: 2022-02-18
  Administered 2022-02-18 (×3): 25 ug via INTRAVENOUS

## 2022-02-18 MED ORDER — HEPARIN (PORCINE) 25000 UT/250ML-% IV SOLN
1650.0000 [IU]/h | INTRAVENOUS | Status: DC
  Administered 2022-02-18 (×2): 1200 [IU]/h via INTRAVENOUS
  Administered 2022-02-19: 1250 [IU]/h via INTRAVENOUS
  Administered 2022-02-20: 1400 [IU]/h via INTRAVENOUS
  Administered 2022-02-21: 1650 [IU]/h via INTRAVENOUS
  Filled 2022-02-18 (×3): qty 250

## 2022-02-18 MED ORDER — SODIUM CHLORIDE 0.9% FLUSH: 10.0000 mL | INTRAVENOUS | Status: DC | PRN

## 2022-02-18 MED ORDER — HEPARIN BOLUS VIA INFUSION
4000.0000 [IU] | Freq: Once | INTRAVENOUS | Status: AC
Start: 2022-02-18 — End: 2022-02-18
  Administered 2022-02-18: 4000 [IU] via INTRAVENOUS
  Filled 2022-02-18: qty 4000

## 2022-02-18 MED ORDER — SODIUM CHLORIDE 0.9 % IV SOLN
INTRAVENOUS | Status: DC | PRN
Start: 2022-02-18 — End: 2022-02-19

## 2022-02-18 MED ORDER — MIDAZOLAM HCL 2 MG/2ML IJ SOLN
INTRAMUSCULAR | Status: AC | PRN
  Administered 2022-02-18 (×5): .5 mg via INTRAVENOUS

## 2022-02-18 MED ORDER — LIDOCAINE HCL 1 % IJ SOLN
INTRAMUSCULAR | Status: AC
  Filled 2022-02-18: qty 20

## 2022-02-18 MED ORDER — CHLORHEXIDINE GLUCONATE CLOTH 2 % EX PADS
6.0000 | MEDICATED_PAD | Freq: Every day | CUTANEOUS | Status: DC
  Administered 2022-02-18 – 2022-02-23 (×8): 6 via TOPICAL

## 2022-02-18 MED ORDER — HEPARIN SODIUM (PORCINE) 1000 UNIT/ML IJ SOLN
INTRAMUSCULAR | Status: AC | PRN
  Administered 2022-02-18 (×2): 3000 [IU] via INTRAVENOUS

## 2022-02-18 MED ORDER — SODIUM CHLORIDE 0.9 % IV SOLN
1.0000 g | INTRAVENOUS | Status: AC
  Administered 2022-02-18: 1 g via INTRAVENOUS
  Filled 2022-02-18: qty 10

## 2022-02-18 MED ORDER — BUDESONIDE 0.5 MG/2ML IN SUSP
0.5000 mg | Freq: Two times a day (BID) | RESPIRATORY_TRACT | Status: DC
  Administered 2022-02-18 – 2022-02-23 (×11): 0.5 mg via RESPIRATORY_TRACT
  Filled 2022-02-18 (×11): qty 2

## 2022-02-18 MED ORDER — SODIUM CHLORIDE 0.9 % IV BOLUS
250.0000 mL | Freq: Once | INTRAVENOUS | Status: AC
  Administered 2022-02-19: 250 mL via INTRAVENOUS

## 2022-02-18 MED ORDER — ARFORMOTEROL TARTRATE 15 MCG/2ML IN NEBU
15.0000 ug | INHALATION_SOLUTION | Freq: Two times a day (BID) | RESPIRATORY_TRACT | Status: DC
Start: 2022-02-18 — End: 2022-02-23
  Administered 2022-02-18 – 2022-02-23 (×11): 15 ug via RESPIRATORY_TRACT
  Filled 2022-02-18 (×12): qty 2

## 2022-02-18 MED ORDER — DOCUSATE SODIUM 100 MG PO CAPS
100.0000 mg | ORAL_CAPSULE | Freq: Two times a day (BID) | ORAL | Status: DC | PRN
  Administered 2022-02-18: 100 mg via ORAL
  Filled 2022-02-18: qty 1

## 2022-02-18 MED ORDER — IOHEXOL 300 MG/ML  SOLN
100.0000 mL | Freq: Once | INTRAMUSCULAR | Status: AC | PRN
  Administered 2022-02-18: 76 mL via INTRAVENOUS

## 2022-02-18 NOTE — Progress Notes (Signed)
ANTICOAGULATION CONSULT NOTE - Follow Up Consult  Pharmacy Consult for Heparin Indication: pulmonary embolus  Allergies  Allergen Reactions   Atorvastatin     Other reaction(s): Myalgias    Patient Measurements: Height: 6' (182.9 cm) Weight: 70.4 kg (155 lb 3.3 oz) IBW/kg (Calculated) : 77.6 Heparin Dosing Weight: 70.4 kg  Vital Signs: Temp: 100.2 F (37.9 C) (02/22 0743) Temp Source: Oral (02/22 0743) BP: 100/70 (02/22 1110) Pulse Rate: 97 (02/22 1110)  Labs: Recent Labs    02/17/22 2140 02/17/22 2334 02/18/22 0214 02/18/22 1001  HGB 10.1*  --  9.5*  --   HCT 31.4*  --  29.2*  --   PLT 336  --  289  --   LABPROT 14.8  --   --   --   INR 1.2  --   --   --   HEPARINUNFRC  --   --   --  0.26*  CREATININE 1.06  --  0.97  --   TROPONINIHS 636* 722* 721*  --     Estimated Creatinine Clearance: 63.5 mL/min (by C-G formula based on SCr of 0.97 mg/dL).   Assessment: Jose Wolfe is a 76 year old male with bilateral PE with RHS. He is s/p thrombectomy on 2/22. He received a total of 6,000 units bolus in IR today.  Goal of Therapy:  Heparin level 0.3-0.7 units/ml Monitor platelets by anticoagulation protocol: Yes   Plan:  Continue heparin at 1200 units/h Monitor CBC and signs/symptoms of bleeding Check heparin level in 6 hours  Lestine Box, PharmD PGY2 Infectious Diseases Pharmacy Resident   Please check [email or web address] for unit-specific pharmacy phone numbers

## 2022-02-18 NOTE — Progress Notes (Signed)
ANTICOAGULATION CONSULT NOTE  Pharmacy Consult for Heparin Indication: pulmonary embolus  Allergies  Allergen Reactions   Atorvastatin     Other reaction(s): Myalgias    Patient Measurements: Height: 6' (182.9 cm) Weight: 70.4 kg (155 lb 3.3 oz) IBW/kg (Calculated) : 77.6 Heparin Dosing Weight: 70.4 kg  Vital Signs: Temp: 98.7 F (37.1 C) (02/22 1514) Temp Source: Oral (02/22 1514) BP: 92/62 (02/22 1700) Pulse Rate: 95 (02/22 1700)  Labs: Recent Labs    02/17/22 2140 02/17/22 2334 02/18/22 0214 02/18/22 1001 02/18/22 1631  HGB 10.1*  --  9.5*  --   --   HCT 31.4*  --  29.2*  --   --   PLT 336  --  289  --   --   LABPROT 14.8  --   --   --   --   INR 1.2  --   --   --   --   HEPARINUNFRC  --   --   --  0.26* 0.69  CREATININE 1.06  --  0.97  --   --   TROPONINIHS 636* 722* 721*  --   --      Estimated Creatinine Clearance: 63.5 mL/min (by C-G formula based on SCr of 0.97 mg/dL).   Assessment: Jose Wolfe is a 76 year old male with bilateral PE with RHS and Pharmacy consulted to dose IV heparin.  He is s/p thrombectomy on 2/22.    Heparin level therapeutic at 0.69 units/mL, at high end of normal.  He received a total of 6,000 units bolus in IR today, thus, affecting the current heparin level.  Expect level to drift down.  No bleeding reported.  Goal of Therapy:  Heparin level 0.3-0.7 units/ml Monitor platelets by anticoagulation protocol: Yes   Plan:  Continue heparin gtt at 1200 units/hr F/U AM labs Monitor for s/sx of bleeding  Jaeleigh Monaco D. Mina Marble, PharmD, BCPS, [place] 02/18/2022, 5:17 PM

## 2022-02-18 NOTE — Procedures (Signed)
Interventional Radiology Procedure Note  Procedure: bilateral PE thrombectomy with Penumbra 56fr suction device    Complications: None  Estimated Blood Loss:  170cc  Findings: Successful removal of acute clot bilaterally Full report in pacs     Tamera Punt, MD

## 2022-02-18 NOTE — Progress Notes (Signed)
PHARMACY - PHYSICIAN COMMUNICATION CRITICAL VALUE ALERT - BLOOD CULTURE IDENTIFICATION (BCID)  Jose Wolfe is an 76 y.o. male who presented to [facility] on 02/17/2022 with a chief complaint of pulmonary embolus  2/21 Blood>> 1/4 GPC, staph epidermidis per lab   Assessment: WBC elevated (possible UTI), last temp 98.5  Name of physician (or Provider) Contacted: Dr. Oletta Darter  Current antibiotics: Ceftriaxone   Changes to prescribed antibiotics recommended:  No changes Likely contaminant   No results found for this or any previous visit.  Narda Bonds, PharmD, BCPS Clinical Pharmacist Phone: [phone]

## 2022-02-18 NOTE — Sedation Documentation (Signed)
PAP: 19/3 (7)

## 2022-02-18 NOTE — Progress Notes (Signed)
NAME:  Jose Wolfe, MRN:  [id number], DOB:  19-Apr-1946, LOS: 0 ADMISSION DATE:  02/17/2022, CONSULTATION DATE:  02/18/22 REFERRING MD:  Doren Custard CHIEF COMPLAINT:  Dyspnea, weakness   History of Present Illness:  Jose Wolfe is a 76 y.o. male who has a PMH as outlined below including but not limited to metastatic advanced prostate CA with lymphadenopathy initially diagnosed in 2020 and currently undergoing chemotherapy.  He presented to [facility] ED 2/21 with generalized weakness and dyspnea.  States he went to the bathroom earlier that morning and became lightheaded and ended up falling off commode.  Did not hit his head or lose consciousness.  Has had dyspnea x 4 - 6 months and saw cardiology but was cleared.  Dyspnea worsened day of presentation but he did not think much of it.  On day prior to ED presentation, he had a full day including driving himself to lunch and visit with friends.   In ED, he had CTA that demonstrated extensive bilateral PE with RV/LV of 2.27.  Initial troponin 722 and lactate 4.4.  He was placed on 5L O2 via [place] and O2 remained 99%.  Initially was hypotensive and tachycardic but these both improved with gradual fluids.  Currently has HR in 90s and SBP 108.  He denies chest pain or significant dyspnea at this time.  Denies lightheadedness.  No prior hx of PE or family hx of VTE.  His LLE has been swollen for the past week.  He saw vascular and had LE duplex 2/16 that was negative for DVT but suggested extrinsic compression / obstruction of the left common iliac vein and possible obstruction proximal to the inguinal ligament.   UA in ED also suggestive of UTI.  Of note, he has bilateral nephrostomy tubes placed 2/16 after he had bilateral obstructive uropathy.  Pertinent  Medical History  Metastatic castration-resistant adenocarcinoma of prostate ([place]); Port-A-Cath in place; Large bowel obstruction (HCC); COPD (chronic obstructive pulmonary disease) ([place]); Obstructive  uropathy; Bilateral hydronephrosis; AKI (acute kidney injury) ([place]) due to bilateral obstructive uropathy; Hyperkalemia; Atypical chest pain; Normocytic anemia; Leukopenia due to antineoplastic chemotherapy [facility]); and Pulmonary embolism ([place]) on their problem list.  Significant Hospital Events: Including procedures, antibiotic start and stop dates in addition to other pertinent events   2/22 > admit with submassive PE with Rt heart strain by CTA.   Interim History / Subjective:  Patient feels a lot better this morning . He denies chest pain. He is not short of breath at rest and weaned patient off supplemental oxygen in room. Maintain sats > 96% at rest.   Objective   Blood pressure 119/66, pulse 90, temperature 99.6 F (37.6 C), temperature source Oral, resp. rate (!) 22, height 6' (1.829 m), weight 70.4 kg, SpO2 100 %.        Intake/Output Summary (Last 24 hours) at 02/18/2022 7829 Last data filed at 02/18/2022 0500 Gross per 24 hour  Intake --  Output 425 ml  Net -425 ml   Filed Weights   02/17/22 2112  Weight: 70.4 kg    Examination: General: Resting in bed with blanket around shoulders and head, NAD HENT: [place]/AT, EOMI Lungs: Normal work of breathing, no wheezing Cardiovascular: RRR, no murmur Abdomen: soft BS+ , bilateral nephrostomy tubes  Extremities: LLE > right, 3 + non pitting edema, no erythema or tenderness   Resolved Hospital Problem list     Assessment & Plan:  Acute bilateral Submassive PE with right heart strain on CTA,  Provoked  - Patient living with metastatic prostate cancer - IR consulting for consideration of EKOS, patients vitals are stable and now on room air.  - Continue heparin for now - Echo pending   Sepsis, secondary to UTI.  Patient has bilateral nephrostomy tubes in place for bilateral obstructive uropathy -Tmax 100.8 in last 24 hours -Lactate trended down -Continue ceftriaxone  Metastatic advanced prostate cancer - was scheduled for  chemo today - Follow up with oncologist at discharge   #COPD - Continue budesonide/chronic low of home Symbicort - as need levalbuterol in lieu of home ventolin    Best Practice (right click and "Reselect all SmartList Selections" daily)   Diet/type: NPO DVT prophylaxis: systemic heparin GI prophylaxis: N/A Lines: N/A Foley:  N/A Code Status:  DNR Last date of multidisciplinary goals of care discussion [patient to update his wife]  Tamsen Snider, MD PGY3 Internal Medicine [phone]

## 2022-02-18 NOTE — Progress Notes (Signed)
Called to review case for PE intervention.  Imaging, and Epic chart reviewed.  Massive to submassive PE with Rt heart strain by CTA.    Troponin and lactate elevated. Just started heparin about 2 hrs ago. Also getting fluids and IV rocephin for UTI/urosepsis.  O2 requirement with 5L [place] at 99%  pt. Has met prostate ca and bilateral PCNs  Vitals have improved with hydration.  CCM PA notes state he is in NAD with unlabored respirations  Agree with Heparin, ICU adm, ECHO, and IR team will assess early am for possible PE thrombectomy  D/w Milon Dikes

## 2022-02-18 NOTE — Sedation Documentation (Signed)
PAP 32/8 (16)

## 2022-02-18 NOTE — H&P (Signed)
NAME:  Jose Wolfe, MRN:  [id number], DOB:  10/12/1946, LOS: 0 ADMISSION DATE:  02/17/2022, CONSULTATION DATE:  02/18/22 REFERRING MD:  Doren Custard CHIEF COMPLAINT:  Dyspnea, weakness   History of Present Illness:  Jose Wolfe is a 76 y.o. male who has a PMH as outlined below including but not limited to metastatic advanced prostate CA with lymphadenopathy initially diagnosed in 2020 and currently undergoing chemotherapy.  He presented to [facility] ED 2/21 with generalized weakness and dyspnea.  States he went to the bathroom earlier that morning and became lightheaded and ended up falling off commode.  Did not hit his head or lose consciousness.  Has had dyspnea x 4 - 6 months and saw cardiology but was cleared.  Dyspnea worsened day of presentation but he did not think much of it.  On day prior to ED presentation, he had a full day including driving himself to lunch and visit with friends.  In ED, he had CTA that demonstrated extensive bilateral PE with RV/LV of 2.27.  Initial troponin 722 and lactate 4.4.  He was placed on 5L O2 via [place] and O2 remained 99%.  Initially was hypotensive and tachycardic but these both improved with gradual fluids.  Currently has HR in 90s and SBP 108.  He denies chest pain or significant dyspnea at this time.  Denies lightheadedness.  No prior hx of PE or family hx of VTE.  His LLE has been swollen for the past week.  He saw vascular and had LE duplex 2/16 that was negative for DVT but suggested extrinsic compression / obstruction of the left common iliac vein and possible obstruction proximal to the inguinal ligament.  UA in ED also suggestive of UTI.  Of note, he has bilateral nephrostomy tubes placed 2/16 after he had bilateral obstructive uropathy.   Pertinent  Medical History:  has Metastatic castration-resistant adenocarcinoma of prostate ([place]); Port-A-Cath in place; Large bowel obstruction (HCC); COPD (chronic obstructive pulmonary disease) ([place]);  Obstructive uropathy; Bilateral hydronephrosis; AKI (acute kidney injury) ([place]) due to bilateral obstructive uropathy; Hyperkalemia; Atypical chest pain; Normocytic anemia; Leukopenia due to antineoplastic chemotherapy [facility]); and Pulmonary embolism ([place]) on their problem list.  Significant Hospital Events: Including procedures, antibiotic start and stop dates in addition to other pertinent events   2/22 > admit.  Interim History / Subjective:  Comfortable on 5L [place].  HR 90s and SBP 108. Denies current chest pain or significant dyspnea.  Objective:  Blood pressure 99/73, pulse 93, temperature (!) 100.8 F (38.2 C), temperature source Rectal, resp. rate 19, height 6' (1.829 m), weight 70.4 kg, SpO2 98 %.       No intake or output data in the 24 hours ending 02/18/22 0101 Filed Weights   02/17/22 2112  Weight: 70.4 kg    Examination: General: Adult male, resting in bed, in NAD. Neuro: A&O x 3, no deficits. HEENT: [place]/AT. Sclerae anicteric. EOMI. Cardiovascular: RRR, no M/R/G.  Lungs: Respirations even and unlabored.  CTA bilaterally, No W/R/R. Abdomen: Bilateral nephrostomy tubes in place with clear urine in bags. BS x 4, soft, NT/ND.  Musculoskeletal: LLE edematous 3+ non pitting.  RLE normal.  No deformities. Skin: Intact, warm, no rashes.  Labs/imaging personally reviewed:  CTA chest 2/22 > extensive bilateral PE with RV/LV of 2.27. Echo 2/22 >  LE duplex 2/22 >    Assessment & Plan:   Large bilateral PE with CT evidence of right heart strain - provoked in setting underlying malignancy and presumed LLE  DVT though LE duplex from 2/16 was surprisingly negative. - Empiric heparin. - EDP calling IR for EKOS which we agree with. - No current role for systemic tPA. - Assess echo and LE duplex. - Would keep on anticoagulation lifelong after discharge. - Trend troponin, lactate.  Sepsis - 2/2 UTI.  Of note, has bilateral nephrostomy tubes placed 2/16 for bilateral obstructive  uropathy. - Continue fluids. - Start Ceftriaxone. - F/u repeat lactate.  Metastatic advanced prostate CA. - F/u with oncology as outpatient.  Hx COPD. - Budesonide / Brovana in lieu of home Symbicort. - Levalbuterol in lieu of home Ventolin.   Family updates:  Pt requests that I do not call his wife at this hour as she needs to sleep.  He states he would like to be first to call her tomorrow morning before anyone from our team calls for an update.   Best practice (evaluated daily):  Diet/type: NPO DVT prophylaxis: systemic heparin GI prophylaxis: N/A Lines: N/A Foley:  N/A Code Status:  DNR confirmed with pt at bedside. Last date of multidisciplinary goals of care discussion: Not yet.  Labs   CBC: Recent Labs  Lab 02/17/22 2140  WBC 16.9*  NEUTROABS 15.5*  HGB 10.1*  HCT 31.4*  MCV 94.3  PLT 333    Basic Metabolic Panel: Recent Labs  Lab 02/17/22 2140  NA 132*  K 4.8  CL 98  CO2 19*  GLUCOSE 185*  BUN 13  CREATININE 1.06  CALCIUM 7.8*  MG 2.0   GFR: Estimated Creatinine Clearance: 58.1 mL/min (by C-G formula based on SCr of 1.06 mg/dL). Recent Labs  Lab 02/17/22 2140 02/17/22 2334  WBC 16.9*  --   LATICACIDVEN 5.1* 4.4*    Liver Function Tests: Recent Labs  Lab 02/17/22 2140  AST 49*  ALT 27  ALKPHOS 79  BILITOT 0.7  PROT 6.1*  ALBUMIN 2.6*   Recent Labs  Lab 02/17/22 2140  LIPASE 28   No results for input(s): AMMONIA in the last 168 hours.  ABG No results found for: PHART, PCO2ART, PO2ART, HCO3, TCO2, ACIDBASEDEF, O2SAT   Coagulation Profile: Recent Labs  Lab 02/17/22 2140  INR 1.2    Cardiac Enzymes: No results for input(s): CKTOTAL, CKMB, CKMBINDEX, TROPONINI in the last 168 hours.  HbA1C: No results found for: HGBA1C  CBG: No results for input(s): GLUCAP in the last 168 hours.  Review of Systems:   All negative; except for those that are bolded, which indicate positives.  Constitutional: weight loss, weight  gain, night sweats, fevers, chills, fatigue, weakness.  HEENT: headaches, sore throat, sneezing, nasal congestion, post nasal drip, difficulty swallowing, tooth/dental problems, visual complaints, visual changes, ear aches. Neuro: difficulty with speech, weakness, numbness, ataxia. CV:  chest pain, orthopnea, PND, swelling in lower extremities, dizziness, palpitations, syncope.  Resp: cough, hemoptysis, dyspnea, wheezing. GI: heartburn, indigestion, abdominal pain, nausea, vomiting, diarrhea, constipation, change in bowel habits, loss of appetite, hematemesis, melena, hematochezia.  GU: dysuria, change in color of urine, urgency or frequency, flank pain, hematuria. MSK: joint pain or swelling, decreased range of motion, LLE edema. Psych: change in mood or affect, depression, anxiety, suicidal ideations, homicidal ideations. Skin: rash, itching, bruising.   Past Medical History:  He,  has a past medical history of COPD (chronic obstructive pulmonary disease) ([place]) and Prostate cancer ([place]).   Surgical History:   Past Surgical History:  Procedure Laterality Date   IR IMAGING GUIDED PORT INSERTION  10/09/2021   IR NEPHROSTOMY PLACEMENT LEFT  01/12/2022   IR NEPHROSTOMY PLACEMENT RIGHT  01/12/2022   RADIOACTIVE SEED IMPLANT  2011   TONSILLECTOMY     TRANSURETHRAL RESECTION OF BLADDER TUMOR N/A 12/15/2018   Procedure: CYSTOSCOPY TRANSURETHRAL RESECTION OF BLADDER TUMOR (TURBT);  Surgeon: Irine Seal, MD;  Location: [facility];  Service: Urology;  Laterality: N/A;     Social History:   reports that he has never smoked. He has never used smokeless tobacco. He reports current alcohol use of about 1.0 standard drink per week. He reports that he does not use drugs.   Family History:  His family history includes Cancer in his maternal uncle; Prostate cancer in his brother.   Allergies Allergies  Allergen Reactions   Atorvastatin     Other reaction(s): Myalgias     Home Medications  Prior to  Admission medications   Medication Sig Start Date End Date Taking? Authorizing Provider  acetaminophen (TYLENOL) 500 MG tablet Take 1,000 mg by mouth every 6 (six) hours as needed for moderate pain or headache.   Yes [provider]  B Complex Vitamins (VITAMIN B COMPLEX) TABS Take 1 tablet by mouth at bedtime.   Yes [provider]  betamethasone dipropionate (DIPROLENE) 0.05 % cream Apply 1 application topically 2 (two) times daily as needed (for psoriasis).  10/28/18  Yes [provider]  budesonide-formoterol (SYMBICORT) 160-4.5 MCG/ACT inhaler Inhale 2 puffs into the lungs 2 (two) times daily.    Yes [provider]  calcium-vitamin D (OSCAL WITH D) 500-200 MG-UNIT tablet Take 1 tablet by mouth 2 (two) times daily.   Yes [provider]  docusate sodium (COLACE) 100 MG capsule Take 100 mg by mouth at bedtime.   Yes [provider]  guaiFENesin (MUCINEX) 600 MG 12 hr tablet Take 600 mg by mouth 2 (two) times daily as needed for cough.   Yes [provider]  leuprolide, 6 Month, (LEUPROLIDE ACETATE, 6 MONTH,) 45 MG injection Inject 45 mg into the skin every 6 (six) months.   Yes [provider]  lidocaine-prilocaine (EMLA) cream Apply 1 application topically as needed. Patient taking differently: Apply 1 application topically as needed (access port). 09/23/21  Yes Wyatt Portela, MD  prochlorperazine (COMPAZINE) 10 MG tablet Take 1 tablet (10 mg total) by mouth every 6 (six) hours as needed for nausea or vomiting. 11/05/21  Yes Wyatt Portela, MD  senna-docusate (SENNA S) 8.6-50 MG tablet Take 1 tablet by mouth at bedtime as needed for mild constipation. 01/07/22  Yes Wyatt Portela, MD  triamcinolone cream (KENALOG) 0.1 % Apply 1 application topically 2 (two) times daily as needed (for psoriasis). 10/28/18  Yes [provider]  VENTOLIN HFA 108 (90 Base) MCG/ACT inhaler INHALE 1 PUFF BY MOUTH EVERY 4 HOURS AS  NEEDED Patient taking differently: Inhale 1 puff into the lungs every 4 (four) hours as needed for wheezing or shortness of breath. 09/10/21  Yes Croitoru, Mihai, MD  megestrol (MEGACE ES) 625 MG/5ML suspension Take 5 mLs (625 mg total) by mouth daily. Patient not taking: Reported on 02/18/2022 01/02/22   Wyatt Portela, MD     Critical care time: 40 min.   Montey Hora, [place] [facility] For pager details, please see AMION or use Epic chat  After 1900, please call [place] for cross coverage needs 02/18/2022, 1:01 AM

## 2022-02-18 NOTE — TOC Progression Note (Signed)
Transition of Care [facility]) - Progression Note    Patient Details  Name: Jose Wolfe MRN: [id number] Date of Birth: [birth year]  Transition of Care [facility]) CM/SW [place], RN Phone Number:[phone]  02/18/2022, 4:13 PM  Clinical Narrative:     Transition of Care [facility]) Screening Note   Patient Details  Name: Jose Wolfe Date of Birth: Nov 11, 1946   [facility]) CM/SW Contact:    Angelita Ingles, RN Phone Number: 02/18/2022, 4:13 PM    Transition of Care Department [facility]) has reviewed patient and no TOC needs have been identified at this time. We will continue to monitor patient advancement through interdisciplinary progression rounds.           Expected Discharge Plan and Services                                                 Social Determinants of Health (SDOH) Interventions    Readmission Risk Interventions No flowsheet data found.

## 2022-02-18 NOTE — Consult Note (Signed)
Chief Complaint: Patient was seen in consultation today for pulmonary artery angiogram with intervention Chief Complaint  Patient presents with   Weakness   Hypotension   Emesis   at the request of PCCM  Referring Physician(s): PCCM  Supervising Physician: Daryll Brod  Patient Status: [facility] - In-pt  History of Present Illness: Jose Wolfe is a 76 y.o. male with PMHs of COPD, advanced metastatic prostate cancer, hydronephrosis secondary to advanced prostate cancer s/p lateral PCN placement and exchanges by IR.  Patient is well-known to IR service for previous PCN placement and exchanges.  Patient presented ED on 02/17/2022 due to poor appetite, dizziness and hypotension.  Vital signs were significant for hypotension with tachycardia.  O2 sat remained in upper 90s in room air. Troponin 1 was elevated as well. He underwent CTA chest which showed:  1. Extensive bilateral pulmonary embolism with evidence of right heart strain (RV/LV ratio of approximately 2.27). 2. Findings consistent with diffuse osseous metastatic disease. 3. Small hiatal hernia  Patient was admitted to medical ICU for further evaluation and management and started on IV heparin.  IR was requested for image guided pulmonary artery angiogram with possible intervention. Patient seen in inpatient room with Dr. Annamaria Boots this morning.  Patient laying in bed, not in acute distress. RN, CNA, and resident at bedside.  Patient states that he was in normal state of health and was able to perform all the normal daily activities such as driving on Monday, but all the sudden he started feeling weak on 02/17/22.  He currently states that he continues to feel weak, but denies other symptoms such as SOB, headache, fever, chills, cough, chest pain, abdominal pain, nausea ,vomiting, and bleeding.   Past Medical History:  Diagnosis Date   COPD (chronic obstructive pulmonary disease) ([place])    Prostate cancer [facility]      Past Surgical History:  Procedure Laterality Date   IR IMAGING GUIDED PORT INSERTION  10/09/2021   IR NEPHROSTOMY PLACEMENT LEFT  01/12/2022   IR NEPHROSTOMY PLACEMENT RIGHT  01/12/2022   RADIOACTIVE SEED IMPLANT  2011   TONSILLECTOMY     TRANSURETHRAL RESECTION OF BLADDER TUMOR N/A 12/15/2018   Procedure: CYSTOSCOPY TRANSURETHRAL RESECTION OF BLADDER TUMOR (TURBT);  Surgeon: Irine Seal, MD;  Location: [facility] ORS;  Service: Urology;  Laterality: N/A;    Allergies: Atorvastatin  Medications: Prior to Admission medications   Medication Sig Start Date End Date Taking? Authorizing Provider  acetaminophen (TYLENOL) 500 MG tablet Take 1,000 mg by mouth every 6 (six) hours as needed for moderate pain or headache.   Yes [provider]  B Complex Vitamins (VITAMIN B COMPLEX) TABS Take 1 tablet by mouth at bedtime.   Yes [provider]  betamethasone dipropionate (DIPROLENE) 0.05 % cream Apply 1 application topically 2 (two) times daily as needed (for psoriasis).  10/28/18  Yes [provider]  budesonide-formoterol (SYMBICORT) 160-4.5 MCG/ACT inhaler Inhale 2 puffs into the lungs 2 (two) times daily.    Yes [provider]  calcium-vitamin D (OSCAL WITH D) 500-200 MG-UNIT tablet Take 1 tablet by mouth 2 (two) times daily.   Yes [provider]  docusate sodium (COLACE) 100 MG capsule Take 100 mg by mouth at bedtime.   Yes [provider]  guaiFENesin (MUCINEX) 600 MG 12 hr tablet Take 600 mg by mouth 2 (two) times daily as needed for cough.   Yes [provider]  leuprolide, 6 Month, (LEUPROLIDE ACETATE, 6 MONTH,) 45 MG injection  Inject 45 mg into the skin every 6 (six) months.   Yes [provider]  lidocaine-prilocaine (EMLA) cream Apply 1 application topically as needed. Patient taking differently: Apply 1 application topically as needed (access port). 09/23/21  Yes Wyatt Portela, MD  prochlorperazine (COMPAZINE) 10 MG  tablet Take 1 tablet (10 mg total) by mouth every 6 (six) hours as needed for nausea or vomiting. 11/05/21  Yes Wyatt Portela, MD  senna-docusate (SENNA S) 8.6-50 MG tablet Take 1 tablet by mouth at bedtime as needed for mild constipation. 01/07/22  Yes Wyatt Portela, MD  triamcinolone cream (KENALOG) 0.1 % Apply 1 application topically 2 (two) times daily as needed (for psoriasis). 10/28/18  Yes [provider]  VENTOLIN HFA 108 (90 Base) MCG/ACT inhaler INHALE 1 PUFF BY MOUTH EVERY 4 HOURS AS NEEDED Patient taking differently: Inhale 1 puff into the lungs every 4 (four) hours as needed for wheezing or shortness of breath. 09/10/21  Yes Croitoru, Mihai, MD  megestrol (MEGACE ES) 625 MG/5ML suspension Take 5 mLs (625 mg total) by mouth daily. Patient not taking: Reported on 02/18/2022 01/02/22   Wyatt Portela, MD     Family History  Problem Relation Age of Onset   Prostate cancer Brother    Cancer Maternal Uncle        unknown type    Social History   Socioeconomic History   Marital status: Married    Spouse name: Not on file   Number of children: 0   Years of education: Not on file   Highest education level: Not on file  Occupational History   Not on file  Tobacco Use   Smoking status: Never   Smokeless tobacco: Never  Vaping Use   Vaping Use: Never used  Substance and Sexual Activity   Alcohol use: Yes    Alcohol/week: 1.0 standard drink    Types: 1 Glasses of wine per week    Comment: daily with dinner   Drug use: No   Sexual activity: Not Currently  Other Topics Concern   Not on file  Social History Narrative   Not on file   Social Determinants of Health   Financial Resource Strain: Not on file  Food Insecurity: Not on file  Transportation Needs: Not on file  Physical Activity: Not on file  Stress: Not on file  Social Connections: Not on file     Review of Systems: A 12 point ROS discussed and pertinent positives are indicated in the HPI above.  All  other systems are negative.  Vital Signs: BP 114/71    Pulse 95    Temp 100.2 F (37.9 C) (Oral)    Resp 20    Ht 6' (1.829 m)    Wt 155 lb 3.3 oz (70.4 kg)    SpO2 93%    BMI 21.05 kg/m    Physical Exam Vitals reviewed.  Constitutional:      General: He is not in acute distress.    Appearance: Normal appearance. He is not diaphoretic.  HENT:     Head: Normocephalic and atraumatic.  Cardiovascular:     Rate and Rhythm: Normal rate and regular rhythm.     Heart sounds: Normal heart sounds.     Comments: Bilateral DP not palpable, feet are warm and cap refill ok Pulmonary:     Effort: Pulmonary effort is normal.     Breath sounds: Normal breath sounds.  Abdominal:     General: Abdomen is  flat.     Palpations: Abdomen is soft.  Musculoskeletal:        General: Swelling present.     Comments: Left foot edema   Skin:    General: Skin is warm and dry.     Coloration: Skin is not jaundiced or pale.     Findings: Erythema present.     Comments: Mild left foot erythema   Neurological:     Mental Status: He is alert and oriented to person, place, and time.  Psychiatric:        Mood and Affect: Mood normal.        Behavior: Behavior normal.        Judgment: Judgment normal.    MD Evaluation Airway: WNL Heart: WNL Abdomen: WNL Chest/ Lungs: WNL ASA  Classification: 3 Mallampati/Airway Score: One  Imaging: CT Angio Chest PE W and/or Wo Contrast  Addendum Date: 02/18/2022   ADDENDUM REPORT: 02/18/2022 00:07 ADDENDUM: Results were discussed with Dr. Doren Custard at 12:04 a.m. [place] on February 18, 2022. Electronically Signed   By: Virgina Norfolk M.D.   On: 02/18/2022 00:07   Result Date: 02/18/2022 CLINICAL DATA:  New onset hypoxia. EXAM: CT ANGIOGRAPHY CHEST WITH CONTRAST TECHNIQUE: Multidetector CT imaging of the chest was performed using the standard protocol during bolus administration of intravenous contrast. Multiplanar CT image reconstructions and MIPs were obtained to  evaluate the vascular anatomy. RADIATION DOSE REDUCTION: This exam was performed according to the departmental dose-optimization program which includes automated exposure control, adjustment of the mA and/or kV according to patient size and/or use of iterative reconstruction technique. CONTRAST:  41mL OMNIPAQUE IOHEXOL 350 MG/ML SOLN COMPARISON:  January 09, 2022 FINDINGS: Cardiovascular: Satisfactory opacification of the pulmonary arteries to the segmental level. Extensive intraluminal filling defects are seen within the distal aspects of the bilateral pulmonary arteries and numerous bilateral upper lobe, right middle lobe and bilateral lower lobe branches. There is no evidence of saddle embolus. Normal heart size with right heart strain noted (RV/LV ratio of approximately 2.27). No pericardial effusion. Mediastinum/Nodes: No enlarged mediastinal, hilar, or axillary lymph nodes. Thyroid gland, trachea, and esophagus demonstrate no significant findings. Lungs/Pleura: Lungs are clear. No pleural effusion or pneumothorax. Upper Abdomen: There is a small hiatal hernia. Moderate severity diffuse calcification of the right adrenal gland is seen, with mild diffuse left adrenal gland enlargement. Musculoskeletal: Multilevel degenerative changes are seen throughout the thoracic spine. Numerous, ill-defined sclerotic areas are also seen throughout the thoracic spine and bilateral ribs. Review of the MIP images confirms the above findings. IMPRESSION: 1. Extensive bilateral pulmonary embolism with evidence of right heart strain (RV/LV ratio of approximately 2.27). 2. Findings consistent with diffuse osseous metastatic disease. 3. Small hiatal hernia. Electronically Signed: By: Virgina Norfolk M.D. On: 02/18/2022 00:01   DG Chest Port 1 View  Result Date: 02/17/2022 CLINICAL DATA:  Concern for sepsis EXAM: PORTABLE CHEST 1 VIEW COMPARISON:  01/09/2022 FINDINGS: Cardiac and mediastinal contours are within normal limits.  Redemonstrated right chest port with catheter tip in the low SVC. No focal pulmonary opacity. No pleural effusion or pneumothorax. No acute osseous abnormality. Degenerative changes in the bilateral shoulders. IMPRESSION: No acute cardiopulmonary process. Electronically Signed   By: Merilyn Baba M.D.   On: 02/17/2022 22:03   ECHOCARDIOGRAM COMPLETE  Result Date: 02/18/2022    ECHOCARDIOGRAM REPORT   Patient Name:   Jose Wolfe [facility] Date of Exam: 02/18/2022 Medical Rec #:  [id number]  Height:       72.0 in Accession #:    [id number]              Weight:       155.2 lb Date of Birth:  1946-07-31               BSA:          1.913 m Patient Age:    19 years                BP:           119/66 mmHg Patient Gender: M                       HR:           90 bpm. Exam Location:  Inpatient Procedure: 2D Echo, Cardiac Doppler, Color Doppler and Strain Analysis                STAT ECHO  Dr Tamala Julian and [facility] both have seen echo. Indications:    Pulmonary embolus  History:        Patient has prior history of Echocardiogram examinations, most                 recent 10/09/2021. COPD; Signs/Symptoms:Chest Pain.  Sonographer:    Luisa Hart RDCS Referring Phys: [id number] RAHUL P DESAI IMPRESSIONS  1. Left ventricular ejection fraction, by estimation, is 60 to 65%. The left ventricle has normal function. The left ventricle has no regional wall motion abnormalities. Left ventricular diastolic parameters are consistent with Grade I diastolic dysfunction (impaired relaxation). There is the interventricular septum is flattened in systole and diastole, consistent with right ventricular pressure and volume overload.  2. Right ventricular systolic function is severely reduced. The right ventricular size is moderately enlarged. There is mildly elevated pulmonary artery systolic pressure. The estimated right ventricular systolic pressure is 14.4 mmHg.  3. Right atrial size was moderately dilated.  4. The mitral valve is  normal in structure. No evidence of mitral valve regurgitation. No evidence of mitral stenosis.  5. Tricuspid valve regurgitation is moderate.  6. The aortic valve is normal in structure. There is mild calcification of the aortic valve. There is mild thickening of the aortic valve. Aortic valve regurgitation is trivial. Aortic valve sclerosis is present, with no evidence of aortic valve stenosis. Aortic valve mean gradient measures 2.0 mmHg. Aortic valve Vmax measures 1.16 m/s.  7. The inferior vena cava is dilated in size with >50% respiratory variability, suggesting right atrial pressure of 8 mmHg. Comparison(s): Prior images reviewed side by side. RV function and size has worsened when compared to prior ECHO. FINDINGS  Left Ventricle: Left ventricular ejection fraction, by estimation, is 60 to 65%. The left ventricle has normal function. The left ventricle has no regional wall motion abnormalities. Global longitudinal strain performed but not reported based on interpreter judgement due to suboptimal tracking. The left ventricular internal cavity size was normal in size. There is no left ventricular hypertrophy. The interventricular septum is flattened in systole and diastole, consistent with right ventricular pressure and volume overload. Left ventricular diastolic parameters are consistent with Grade I diastolic dysfunction (impaired relaxation). Right Ventricle: The right ventricular size is moderately enlarged. No increase in right ventricular wall thickness. Right ventricular systolic function is severely reduced. There is mildly elevated pulmonary artery systolic pressure. The tricuspid regurgitant velocity is 2.81 m/s, and with an assumed right atrial pressure of 8 mmHg,  the estimated right ventricular systolic pressure is 16.1 mmHg. Left Atrium: Left atrial size was normal in size. Right Atrium: Right atrial size was moderately dilated. Pericardium: There is no evidence of pericardial effusion. Mitral  Valve: The mitral valve is normal in structure. No evidence of mitral valve regurgitation. No evidence of mitral valve stenosis. Tricuspid Valve: The tricuspid valve is normal in structure. Tricuspid valve regurgitation is moderate . No evidence of tricuspid stenosis. Aortic Valve: The aortic valve is normal in structure. There is mild calcification of the aortic valve. There is mild thickening of the aortic valve. Aortic valve regurgitation is trivial. Aortic valve sclerosis is present, with no evidence of aortic valve stenosis. Aortic valve mean gradient measures 2.0 mmHg. Aortic valve peak gradient measures 5.4 mmHg. Aortic valve area, by VTI measures 3.23 cm. Pulmonic Valve: The pulmonic valve was normal in structure. Pulmonic valve regurgitation is not visualized. No evidence of pulmonic stenosis. Aorta: The aortic root is normal in size and structure. Venous: The inferior vena cava is dilated in size with greater than 50% respiratory variability, suggesting right atrial pressure of 8 mmHg. IAS/Shunts: No atrial level shunt detected by color flow Doppler.  LEFT VENTRICLE PLAX 2D LVIDd:         4.20 cm     Diastology LVIDs:         2.60 cm     LV e' medial:    5.66 cm/s LV PW:         0.80 cm     LV E/e' medial:  7.9 LV IVS:        1.10 cm     LV e' lateral:   7.83 cm/s LVOT diam:     2.30 cm     LV E/e' lateral: 5.7 LV SV:         49 LV SV Index:   26 LVOT Area:     4.15 cm  LV Volumes (MOD) LV vol d, MOD A2C: 68.8 ml LV vol d, MOD A4C: 52.2 ml LV vol s, MOD A2C: 19.0 ml LV vol s, MOD A4C: 29.0 ml LV SV MOD A2C:     49.8 ml LV SV MOD A4C:     52.2 ml LV SV MOD BP:      35.9 ml RIGHT VENTRICLE RV Basal diam:  4.80 cm RV Mid diam:    4.80 cm RV S prime:     11.00 cm/s TAPSE (M-mode): 1.6 cm LEFT ATRIUM             Index        RIGHT ATRIUM           Index LA diam:        2.60 cm 1.36 cm/m   RA Area:     23.60 cm LA Vol (A2C):   18.3 ml 9.57 ml/m   RA Volume:   79.00 ml  41.30 ml/m LA Vol (A4C):   20.3 ml  10.61 ml/m LA Biplane Vol: 20.5 ml 10.72 ml/m  AORTIC VALVE                    PULMONIC VALVE AV Area (Vmax):    3.55 cm     PV Vmax:          1.06 m/s AV Area (Vmean):   2.98 cm     PV Vmean:         65.300 cm/s AV Area (VTI):     3.23 cm  PV VTI:           0.136 m AV Vmax:           116.00 cm/s  PV Peak grad:     4.5 mmHg AV Vmean:          71.200 cm/s  PV Mean grad:     2.0 mmHg AV VTI:            0.152 m      PR End Diast Vel: 3.21 msec AV Peak Grad:      5.4 mmHg AV Mean Grad:      2.0 mmHg LVOT Vmax:         99.20 cm/s LVOT Vmean:        51.100 cm/s LVOT VTI:          0.118 m LVOT/AV VTI ratio: 0.78  AORTA Ao Asc diam: 3.50 cm MITRAL VALVE               TRICUSPID VALVE MV Area (PHT): 3.79 cm    TR Peak grad:   31.6 mmHg MV Decel Time: 200 msec    TR Vmax:        281.00 cm/s MV E velocity: 44.60 cm/s                            SHUNTS                            Systemic VTI:  0.12 m                            Systemic Diam: 2.30 cm Candee Furbish MD Electronically signed by Candee Furbish MD Signature Date/Time: 02/18/2022/8:36:35 AM    Final    VAS [place] LOWER EXTREMITY VENOUS (DVT)  Result Date: 02/12/2022  Lower Venous DVT Study Patient Name:  KAYDIN [facility]ARB[facility]SK[facility]  Date of Exam:   02/12/2022 Medical Rec #: [id number]                Accession #:    [id number] Date of Birth: 03-16-1946                Patient Gender: M Patient Age:   60 years Exam Location:  [facility] Procedure:      VAS [place] LOWER EXTREMITY VENOUS (DVT) Referring Phys: Tressia Miners BAST --------------------------------------------------------------------------------  Indications: Swelling, and Edema.  Risk Factors: Cancer prostate. Performing Technologist: Delorise Shiner RVT  Examination Guidelines: A complete evaluation includes B-mode imaging, spectral Doppler, color Doppler, and power Doppler as needed of all accessible portions of each vessel. Bilateral testing is considered an integral part of a complete examination. Limited  examinations for reoccurring indications may be performed as noted. The reflux portion of the exam is performed with the patient in reverse Trendelenburg.  +-----+---------------+---------+-----------+----------+--------------+  RIGHT Compressibility Phasicity Spontaneity Properties Thrombus Aging  +-----+---------------+---------+-----------+----------+--------------+  CFV   Full            Yes       Yes                                    +-----+---------------+---------+-----------+----------+--------------+   +---------+---------------+---------+-----------+----------+--------------+  LEFT      Compressibility Phasicity Spontaneity Properties Thrombus Aging  +---------+---------------+---------+-----------+----------+--------------+  CFV  Full            No        Yes                                    +---------+---------------+---------+-----------+----------+--------------+  SFJ       Full            No        Yes                                    +---------+---------------+---------+-----------+----------+--------------+  FV Prox   Full                      Yes                                    +---------+---------------+---------+-----------+----------+--------------+  FV Mid    Full                      Yes                                    +---------+---------------+---------+-----------+----------+--------------+  FV Distal Full                      Yes                                    +---------+---------------+---------+-----------+----------+--------------+  POP       Full                      Yes                                    +---------+---------------+---------+-----------+----------+--------------+  GSV       Full                      Yes                                    +---------+---------------+---------+-----------+----------+--------------+  SSV       Full                                                              +---------+---------------+---------+-----------+----------+--------------+ Limited imaging of the left iliac vein suggest extrinsic compression or obstruction of the left common iliac vein.   Findings reported to Loura Halt at 16:17.  Summary: RIGHT: - No evidence of deep vein thrombosis in the lower extremity. No indirect evidence of obstruction proximal to the inguinal ligament.  LEFT: - There is no evidence of deep vein thrombosis in the lower extremity. - There is no evidence of superficial venous thrombosis.  - No cystic structure found in  the popliteal fossa. - Limited imaging suggests extrinsic compression/ obstruction of left common iliac vein. - Possible obstruction proximal to the inguinal ligament.  *See table(s) above for measurements and observations. Electronically signed by Harold Barban MD on 02/12/2022 at 10:42:29 PM.    Final     Labs:  CBC: Recent Labs    01/28/22 0905 02/04/22 1125 02/17/22 2140 02/18/22 0214  WBC 5.1 13.3* 16.9* 15.0*  HGB 7.6* 7.5* 10.1* 9.5*  HCT 23.5* 23.2* 31.4* 29.2*  PLT 462* 485* 336 289    COAGS: Recent Labs    01/10/22 0304 02/17/22 2140  INR 1.1 1.2    BMP: Recent Labs    01/28/22 0905 02/04/22 1125 02/17/22 2140 02/18/22 0214  NA 134* 136 132* 130*  K 4.3 4.4 4.8 4.9  CL 102 102 98 99  CO2 24 28 19* 20*  GLUCOSE 99 96 185* 144*  BUN 14 14 13 14   CALCIUM 8.3* 8.4* 7.8* 7.4*  CREATININE 0.73 0.84 1.06 0.97  GFRNONAA >60 >60 >60 >60    LIVER FUNCTION TESTS: Recent Labs    01/10/22 0304 01/28/22 0905 02/04/22 1125 02/17/22 2140  BILITOT 0.6 0.3 0.3 0.7  AST 128* 15 22 49*  ALT 16 13 15 27   ALKPHOS 77 91 111 79  PROT 5.8* 6.4* 6.1* 6.1*  ALBUMIN 2.9* 3.2* 3.3* 2.6*    TUMOR MARKERS: No results for input(s): AFPTM, CEA, CA199, CHROMGRNA in the last 8760 hours.  Assessment and Plan: 76 y.o. male with bilateral PE with right heart strain, who is in need of image guided pulmonary angiogram with intervention.   The  case was reviewed and approved for image guided pulmonary artery angiogram with PE thrombectomy by Dr. Annamaria Boots.  N.p.o. since midnight BP stable 140/71, heart rate 95, respiratory rate 20, O2 sat 95 % room air low-grade fever 100.2 - will give 1 g ceftriaxone per Dr. Annamaria Boots.   CBC with leukocytosis 15.0, anemia Hgb 9.5, normal platelet 289 BMP with normal RF  Risks and benefits discussed with the patient including, but not limited to bleeding, possible life threatening bleeding and need for blood product transfusion, vascular injury, stroke, contrast induced renal failure, and infection.  All of the patient's questions were answered, patient is agreeable to proceed. Consent signed and in IR.   Thank you for this interesting consult.  I greatly enjoyed meeting Kutter Schnepf and look forward to participating in their care.  A copy of this report was sent to the requesting provider on this date.  Electronically Signed: Tera Mater, PA-C 02/18/2022, 8:55 AM   I spent a total of 40 Minutes    in face to face in clinical consultation, greater than 50% of which was counseling/coordinating care for bilateral PE thrombectomy.   This chart was dictated using voice recognition software.  Despite best efforts to proofread,  errors can occur which can change the documentation meaning.

## 2022-02-18 NOTE — Progress Notes (Signed)
Case reviewed, good candidate for cdTPA.  IR to see.  Erskine Emery MD PCCM

## 2022-02-18 NOTE — TOC Benefit Eligibility Note (Signed)
Patient Teacher, English as a foreign language completed.    The patient is currently admitted and upon discharge could be taking Eliquis 5 mg.  The current 30 day co-pay is, $47.00.   The patient is currently admitted and upon discharge could be taking Xarelto 20 mg.  The current 30 day co-pay is, $47.00.   The patient is insured through [place] Patient Advocate Specialist [place] Patient Advocate Team Direct Number: [phone]  Fax: [phone]

## 2022-02-18 NOTE — Progress Notes (Signed)
ANTICOAGULATION CONSULT NOTE - Initial Consult  Pharmacy Consult for heparin Indication: pulmonary embolus  Allergies  Allergen Reactions   Atorvastatin     Other reaction(s): Myalgias    Patient Measurements: Height: 6' (182.9 cm) Weight: 70.4 kg (155 lb 3.3 oz) IBW/kg (Calculated) : 77.6  Vital Signs: Temp: 100.8 F (38.2 C) (02/21 2136) Temp Source: Rectal (02/21 2136) BP: 97/73 (02/21 2330) Pulse Rate: 96 (02/21 2330)  Labs: Recent Labs    02/17/22 2140  HGB 10.1*  HCT 31.4*  PLT 336  LABPROT 14.8  INR 1.2  CREATININE 1.06  TROPONINIHS 636*    Estimated Creatinine Clearance: 58.1 mL/min (by C-G formula based on SCr of 1.06 mg/dL).   Medical History: Past Medical History:  Diagnosis Date   COPD (chronic obstructive pulmonary disease) ([place])    Prostate cancer ([place])     Assessment: 76yo male c/o generalized weakness, poor appetite, dizziness, hypotension, N/V, and SOB >> CT reveals extensive PE w/ RHS >> to begin heparin.  Goal of Therapy:  Heparin level 0.3-0.7 units/ml Monitor platelets by anticoagulation protocol: Yes   Plan:  Heparin 4000 units IV bolus followed by infusion at 1200 units/hr and monitor heparin levels and CBC.  Wynona Neat, PharmD, BCPS  02/18/2022,12:07 AM

## 2022-02-18 NOTE — Plan of Care (Signed)

## 2022-02-18 NOTE — Progress Notes (Signed)
Bilateral lower extremity venous duplex has been completed. Preliminary results can be found in CV Proc through chart review.  Results were given to the patient's nurse, [name].  02/18/22 2:18 PM Jose Wolfe RVT

## 2022-02-19 ENCOUNTER — Encounter: Payer: Medicare HMO | Admitting: Vascular Surgery

## 2022-02-19 ENCOUNTER — Encounter (HOSPITAL_COMMUNITY): Payer: Medicare HMO

## 2022-02-19 ENCOUNTER — Other Ambulatory Visit: Payer: Self-pay

## 2022-02-19 ENCOUNTER — Inpatient Hospital Stay (HOSPITAL_COMMUNITY): Payer: Medicare HMO

## 2022-02-19 DIAGNOSIS — I2699 Other pulmonary embolism without acute cor pulmonale: Secondary | ICD-10-CM | POA: Diagnosis not present

## 2022-02-19 HISTORY — PX: IR NEPHROSTOMY TUBE CHANGE: IMG1442

## 2022-02-19 HISTORY — PX: IR NEPHROSTOMY EXCHANGE LEFT: IMG6069

## 2022-02-19 LAB — BASIC METABOLIC PANEL
Anion gap: 13 (ref 5–15)
BUN: 15 mg/dL (ref 8–23)
CO2: 18 mmol/L — ABNORMAL LOW (ref 22–32)
Calcium: 6.7 mg/dL — ABNORMAL LOW (ref 8.9–10.3)
Chloride: 100 mmol/L (ref 98–111)
Creatinine, Ser: 1.09 mg/dL (ref 0.61–1.24)
GFR, Estimated: >60 mL/min (ref >60)
Glucose, Bld: 122 mg/dL — ABNORMAL HIGH (ref 70–99)
Potassium: 4.1 mmol/L (ref 3.5–5.1)
Sodium: 131 mmol/L — ABNORMAL LOW (ref 135–145)

## 2022-02-19 LAB — CBC
HCT: 28.2 % — ABNORMAL LOW (ref 39.0–52.0)
Hemoglobin: 9 g/dL — ABNORMAL LOW (ref 13.0–17.0)
MCH: 29.7 pg (ref 26.0–34.0)
MCHC: 31.9 g/dL (ref 30.0–36.0)
MCV: 93.1 fL (ref 80.0–100.0)
Platelets: 210 10*3/uL (ref 150–400)
RBC: 3.03 MIL/uL — ABNORMAL LOW (ref 4.22–5.81)
RDW: 16.5 % — ABNORMAL HIGH (ref 11.5–15.5)
WBC: 13.7 10*3/uL — ABNORMAL HIGH (ref 4.0–10.5)
nRBC: 0 % (ref 0.0–0.2)

## 2022-02-19 LAB — HEPARIN LEVEL (UNFRACTIONATED): Heparin Unfractionated: 0.31 IU/mL (ref 0.30–0.70)

## 2022-02-19 LAB — BLOOD CULTURE ID PANEL (REFLEXED) - BCID2

## 2022-02-19 LAB — GLUCOSE, CAPILLARY
Glucose-Capillary: 144 mg/dL — ABNORMAL HIGH (ref 70–99)
Glucose-Capillary: 150 mg/dL — ABNORMAL HIGH (ref 70–99)
Glucose-Capillary: 126 mg/dL — ABNORMAL HIGH (ref 70–99)

## 2022-02-19 LAB — MAGNESIUM: Magnesium: 2 mg/dL (ref 1.7–2.4)

## 2022-02-19 MED ORDER — AMIODARONE HCL IN DEXTROSE 360-4.14 MG/200ML-% IV SOLN
30.0000 mg/h | INTRAVENOUS | Status: DC
  Administered 2022-02-19 – 2022-02-20 (×2): 30 mg/h via INTRAVENOUS
  Filled 2022-02-19 (×3): qty 200

## 2022-02-19 MED ORDER — CALCIUM GLUCONATE-NACL 2-0.675 GM/100ML-% IV SOLN
2.0000 g | Freq: Once | INTRAVENOUS | Status: AC
  Administered 2022-02-19: 2000 mg via INTRAVENOUS
  Filled 2022-02-19: qty 100

## 2022-02-19 MED ORDER — LIDOCAINE HCL 1 % IJ SOLN
INTRAMUSCULAR | Status: AC
  Administered 2022-02-19: 10 mL
  Filled 2022-02-19: qty 20

## 2022-02-19 MED ORDER — SODIUM CHLORIDE 0.9 % IV SOLN: INTRAVENOUS | Status: DC | PRN

## 2022-02-19 MED ORDER — IOHEXOL 300 MG/ML  SOLN
100.0000 mL | Freq: Once | INTRAMUSCULAR | Status: AC | PRN
Start: 2022-02-19 — End: 2022-02-19
  Administered 2022-02-19: 15 mL

## 2022-02-19 MED ORDER — BOOST / RESOURCE BREEZE PO LIQD CUSTOM
1.0000 | Freq: Three times a day (TID) | ORAL | Status: DC
  Administered 2022-02-20 – 2022-02-21 (×3): 1 via ORAL

## 2022-02-19 MED ORDER — VANCOMYCIN HCL 1250 MG/250ML IV SOLN
1250.0000 mg | INTRAVENOUS | Status: DC
  Filled 2022-02-19: qty 250

## 2022-02-19 MED ORDER — BISACODYL 10 MG RE SUPP
10.0000 mg | Freq: Every day | RECTAL | Status: DC
  Administered 2022-02-19 – 2022-02-20 (×2): 10 mg via RECTAL
  Filled 2022-02-19 (×2): qty 1

## 2022-02-19 MED ORDER — VANCOMYCIN HCL 1500 MG/300ML IV SOLN
1500.0000 mg | Freq: Once | INTRAVENOUS | Status: AC
  Administered 2022-02-19: 1500 mg via INTRAVENOUS
  Filled 2022-02-19: qty 300

## 2022-02-19 MED ORDER — MAGNESIUM SULFATE 4 GM/100ML IV SOLN
4.0000 g | Freq: Once | INTRAVENOUS | Status: AC
  Administered 2022-02-19: 4 g via INTRAVENOUS
  Filled 2022-02-19: qty 100

## 2022-02-19 MED ORDER — POLYETHYLENE GLYCOL 3350 17 G PO PACK
17.0000 g | PACK | Freq: Every day | ORAL | Status: DC
Start: 2022-02-19 — End: 2022-02-23
  Administered 2022-02-19 – 2022-02-22 (×4): 17 g via ORAL
  Filled 2022-02-19 (×4): qty 1

## 2022-02-19 MED ORDER — AMIODARONE LOAD VIA INFUSION
150.0000 mg | Freq: Once | INTRAVENOUS | Status: AC
  Administered 2022-02-19: 150 mg via INTRAVENOUS
  Filled 2022-02-19: qty 83.34

## 2022-02-19 MED ORDER — AMIODARONE HCL IN DEXTROSE 360-4.14 MG/200ML-% IV SOLN
60.0000 mg/h | INTRAVENOUS | Status: AC
  Administered 2022-02-19: 60 mg/h via INTRAVENOUS
  Filled 2022-02-19 (×2): qty 200

## 2022-02-19 MED ORDER — ADULT MULTIVITAMIN W/MINERALS CH
1.0000 | ORAL_TABLET | Freq: Every day | ORAL | Status: DC
  Administered 2022-02-20 – 2022-02-23 (×4): 1 via ORAL
  Filled 2022-02-19 (×4): qty 1

## 2022-02-19 NOTE — Progress Notes (Signed)
° °  Bilat percutaneous nephrostomy placed in IR 01/12/22 Plan for exchange commonly 8 weeks  Pt now IP and underwent Bilat PE thrombectomy in IR yesterday.  PCCM note today regarding PCN infection and bacteremia:  Seen in f/u for high risk submassive PE. Having fatigue, constipation this AM. In afib/RVR. Exam with continued L>R edema Underwent technically successful mechanical thrombectomy yesterday. Growing MRSE in blood 4/4 bottles, given appearance of nephrostomy tubes suspect this is a real infection. Vanc added. Strengthen bowel regimen. Mag/amio given for RVR Probably needs nephrostomy tubes exchanged with bacteremia, infected UA; unfortunately while urine culture states collected it was never sent down. Continue heparin for now, if grows MRSE again probably needs TEE. TTE without vegetation and has good windows.   Keep in ICU today given borderline pressures, Afib/RVR. Erskine Emery MD  Scheduled now for Bilateral PCN exchange in IR Planned for today Pt is aware of procedure benefits and risks and agreeable to proceed Consent signed in chart

## 2022-02-19 NOTE — Progress Notes (Signed)
Initial Nutrition Assessment  DOCUMENTATION CODES:   Severe malnutrition in context of chronic illness  INTERVENTION:   Boost Breeze po TID, each supplement provides 250 kcal and 9 grams of protein.  MVI with minerals daily.  Liberalize diet to regular.  Tailor meals to patient's preferences.   NUTRITION DIAGNOSIS:   Severe Malnutrition related to chronic illness (metastatic prostate cancer) as evidenced by severe muscle depletion, severe fat depletion, percent weight loss (11% weight loss within 6 months).  GOAL:   Patient will meet greater than or equal to 90% of their needs  MONITOR:   PO intake, Supplement acceptance, Labs, Skin  REASON FOR ASSESSMENT:   Rounds    ASSESSMENT:   76 yo male admitted with large bilateral PE. PMH includes metastatic advanced prostate CA with lymphadenopathy currently undergoing chemotherapy, COPD.  Discussed patient in ICU rounds and with RN today. Per RN, patient has a poor appetite and has been eating poorly. Currently on a heart healthy diet. Meal intakes documented at 10%.  Patient reports that he was in the hospital in January d/t constipation. He was eating better after D/C home and had gained a few pounds. Unsure of exact amount of weight loss. He has tried Ensure in the past and it did not feel right in his stomach. He agreed to try [employer] instead. He gets nauseous easily and has to be careful what he eats and how much he eats at one time.  Discussed diet liberalization with MD, okay to liberalize to regular, given minimal PO intake and severe malnutrition. Patient likes ice cream, sherbet, fresh fruit, and cottage cheese with jelly. Will add these items to his meal trays to help maximize oral intake of calories and protein.   Labs reviewed. Na 131 CBG: 150  Medications reviewed and include Miralax.  Weight history reviewed. Patient with 11% weight loss over the past 6 months, which is severe.   NUTRITION - FOCUSED  PHYSICAL EXAM:  Flowsheet Row Most Recent Value  Orbital Region Moderate depletion  Upper Arm Region Severe depletion  Thoracic and Lumbar Region Severe depletion  Buccal Region Severe depletion  Temple Region Moderate depletion  Clavicle Bone Region Severe depletion  Clavicle and Acromion Bone Region Severe depletion  Scapular Bone Region Severe depletion  Dorsal Hand Moderate depletion  Patellar Region Mild depletion  Anterior Thigh Region Mild depletion  Posterior Calf Region Mild depletion  Edema (RD Assessment) Moderate  Hair Reviewed  [thin and sparse]  Eyes Reviewed  Mouth Reviewed  Skin Reviewed  [flaky]  Nails Reviewed       Diet Order:   Diet Order             Diet Heart Room service appropriate? Yes; Fluid consistency: Thin  Diet effective now                   EDUCATION NEEDS:   Education needs have been addressed  Skin:  Skin Assessment: Reviewed RN Assessment  Last BM:  2/21  Height:   Ht Readings from Last 1 Encounters:  02/17/22 6' (1.829 m)    Weight:   Wt Readings from Last 1 Encounters:  02/19/22 70.4 kg    BMI:  Body mass index is 21.05 kg/m.  Estimated Nutritional Needs:   Kcal:  4098-1191  Protein:  110-125 gm  Fluid:  >/= 2.1 L    Lucas Mallow RD, LDN, CNSC Please refer to Amion for contact information.

## 2022-02-19 NOTE — Progress Notes (Addendum)
NAME:  Jose Wolfe, MRN:  [id number], DOB:  1946/09/27, LOS: 1 ADMISSION DATE:  02/17/2022, CONSULTATION DATE:  02/18/22 REFERRING MD:  Jose Wolfe CHIEF COMPLAINT:  Dyspnea, weakness   History of Present Illness:  Jose Wolfe is a 76 y.o. male who has a PMH as outlined below including but not limited to metastatic advanced prostate CA with lymphadenopathy initially diagnosed in 2020 and currently undergoing chemotherapy.  He presented to [facility] ED 2/21 with generalized weakness and dyspnea.  States he went to the bathroom earlier that morning and became lightheaded and ended up falling off commode.  Did not hit his head or lose consciousness.  Has had dyspnea x 4 - 6 months and saw cardiology but was cleared.  Dyspnea worsened day of presentation but he did not think much of it.  On day prior to ED presentation, he had a full day including driving himself to lunch and visit with friends.   In ED, he had CTA that demonstrated extensive bilateral PE with RV/LV of 2.27.  Initial troponin 722 and lactate 4.4.  He was placed on 5L O2 via [place] and O2 remained 99%.  Initially was hypotensive and tachycardic but these both improved with gradual fluids.  Currently has HR in 90s and SBP 108.  He denies chest pain or significant dyspnea at this time.  Denies lightheadedness.  No prior hx of PE or family hx of VTE.  His LLE has been swollen for the past week.  He saw vascular and had LE duplex 2/16 that was negative for DVT but suggested extrinsic compression / obstruction of the left common iliac vein and possible obstruction proximal to the inguinal ligament.   UA in ED also suggestive of UTI.  Of note, he has bilateral nephrostomy tubes placed 2/16 after he had bilateral obstructive uropathy.  Pertinent  Medical History  Metastatic castration-resistant adenocarcinoma of prostate ([place]); Port-A-Cath in place; Large bowel obstruction ([facility]); COPD (chronic obstructive pulmonary disease) ([place]); Obstructive  uropathy; Bilateral hydronephrosis; AKI (acute kidney injury) ([place]) due to bilateral obstructive uropathy; Hyperkalemia; Atypical chest pain; Normocytic anemia; Leukopenia due to antineoplastic chemotherapy [facility]); and Pulmonary embolism ([place]) on their problem list.  Significant Hospital Events: Including procedures, antibiotic start and stop dates in addition to other pertinent events   2/22 > admit with submassive PE with Rt heart strain by CTA. cdTPA with IR  Interim History / Subjective:  Patient states that he was able to get some rest last night. His breathing feels about the same as yesterday and he has used supplemental oxygen periodically throughout the night.   Objective   Blood pressure 90/64, pulse 84, temperature 98.5 F (36.9 C), temperature source Oral, resp. rate (!) 26, height 6' (1.829 m), weight 70.4 kg, SpO2 96 %. PAP: (32)/(8) 32/8      Intake/Output Summary (Last 24 hours) at 02/19/2022 0543 Last data filed at 02/19/2022 0500 Gross per 24 hour  Intake 3845.09 ml  Output 1575 ml  Net 2270.09 ml    Filed Weights   02/17/22 2112 02/19/22 0500  Weight: 70.4 kg 70.4 kg    Examination: General: Resting in bed with blanket around shoulders and head, NAD HENT: [place]/AT, EOMI Lungs: Normal work of breathing, no wheezing, [place] off of face with stable oxygen saturations Cardiovascular: RRR, no murmur Abdomen: soft BS+ , bilateral nephrostomy tubes  Extremities: LLE > right, 3 + non pitting edema, no erythema or tenderness  Na 131 CO2 18 Ca 6.7, corrected for albumin 7.8,  repleted WBC 15-> 13.7 Hgb 9.5-> 9.0 S epi in 1/4 bottles- contaminant Echo showed severely reduced right ventricular systolic function with mildly elevated pulmonary artery pressures, right ventricular pressure at 39.6 mmHg. LE US- findings consistent with acute DVT involving left external iliac vein, left common femoral vein, and left proximal profunda vein.  Resolved Hospital Problem list      Assessment & Plan:  Acute bilateral Submassive PE with right heart strain on CTA, Provoked s/p thrombectomy 2/22 Acute DVT of left external iliac vein, left common femoral vein, and left proximal profunda vein. Echo showed right heart strain. DVT present in left leg. Stable oxygen saturations on room air. - Patient living with metastatic prostate cancer - Continue heparin for now, transition to [place] - stable for transfer to floor - PT/OT eval and treat  Sepsis, secondary to UTI.  Patient has bilateral nephrostomy tubes in place for bilateral obstructive uropathy Leukocytosis Leukocytosis downtrending. - Tmax 100.2 in last 24 hours - Continue ceftriaxone day 2/5  Metastatic advanced prostate cancer - was scheduled for chemo today - Follow up with oncologist at discharge   Hypocalcemia [place]cium corrected for albumin at 7.8. Repleted -check Mg  COPD - Continue budesonide/chronic low of home Symbicort - as need levalbuterol in lieu of home ventolin   Best Practice (right click and "Reselect all SmartList Selections" daily)   Diet/type: carb modified DVT prophylaxis: systemic heparin GI prophylaxis: N/A Lines: N/A Foley:  N/A Code Status:  DNR Last date of multidisciplinary goals of care discussion [patient updated 2/23]  Jose Wolfe M. Jose Wolfe, D.O.  Internal Medicine Resident, PGY-1 [facility] Res[facility]  Pager: [phone] 6:53 AM, 02/19/2022

## 2022-02-19 NOTE — Evaluation (Signed)
Physical Therapy Evaluation Patient Details Name: Jose Wolfe MRN: [id number] DOB: 10/24/1946 Today's Date: 02/19/2022  History of Present Illness  76 yo male admitted 2/21 with weakness and dyspnea with bil submassive PE s/p IR thrombectomy 2/22. PMhx: met prostate CA undergoing chemo, COPD, obstructive uropathy s/p bil nephrostomy tubes 01/12/22  Clinical Impression  Pt initially resistant to mobility but with moving realized need for BM. Pt with incontinent stool during gait as well as further void at toilet. Pt with penile bleeding in standing which pt reports is baseline. Pt with decreased strength, transfers, gait and function who will benefit from acute therapy to maximize mobility, safety and independence to decrease burden of care.   SpO2 97% on 2L at rest with drop to 84% during gait on 2L and recovery at rest to 93% HR 90-105       Recommendations for follow up therapy are one component of a multi-disciplinary discharge planning process, led by the attending physician.  Recommendations may be updated based on patient status, additional functional criteria and insurance authorization.  Follow Up Recommendations Skilled nursing-short term rehab (<3 hours/day)    Assistance Recommended at Discharge Frequent or constant Supervision/Assistance  Patient can return home with the following  A little help with walking and/or transfers;A lot of help with bathing/dressing/bathroom;Assist for transportation;Assistance with cooking/housework;Help with stairs or ramp for entrance    Equipment Recommendations BSC/3in1  Recommendations for Other Services       Functional Status Assessment Patient has had a recent decline in their functional status and demonstrates the ability to make significant improvements in function in a reasonable and predictable amount of time.     Precautions / Restrictions Precautions Precautions: Fall;Other (comment) Precaution Comments: watch sats, bil  nephrostomy tubes      Mobility  Bed Mobility Overal bed mobility: Needs Assistance Bed Mobility: Supine to Sit, Sit to Supine     Supine to sit: Mod assist Sit to supine: Min assist   General bed mobility comments: mod assist to pivot to EOB with HOB 35 degrees. Min assist to return to supine to lift legs to surface. Mod assist to slide toward [facility]    Transfers Overall transfer level: Needs assistance   Transfers: Sit to/from Stand Sit to Stand: Min assist           General transfer comment: cues for hand placement with physical assist to rise from bed and from [facility]    Ambulation/Gait Ambulation/Gait assistance: Min guard Gait Distance (Feet): 14 Feet Assistive device: Rolling walker (2 wheels) Gait Pattern/deviations: Step-through pattern, Decreased stride length, Trunk flexed   Gait velocity interpretation: 1.31 - 2.62 ft/sec, indicative of limited community ambulator   General Gait Details: pt with flexed posture with slow gait with guarding for lines and safety. Pt incontinent of stool during gait and able to walk to and from bathroom with RW  Stairs            Wheelchair Mobility    Modified Rankin (Stroke Patients Only)       Balance Overall balance assessment: Needs assistance Sitting-balance support: No upper extremity supported, Feet supported Sitting balance-Leahy Scale: Fair     Standing balance support: Bilateral upper extremity supported Standing balance-Leahy Scale: Poor Standing balance comment: bil UE support on RW                             Pertinent Vitals/Pain Pain Assessment Pain Assessment: No/denies pain  Home Living Family/patient expects to be discharged to:: Private residence Living Arrangements: Spouse/significant other Available Help at Discharge: Family;Available 24 hours/day Type of Home: House Home Access: Stairs to enter     Alternate Level Stairs-Number of Steps: 12 Home Layout: Two level;Able  to live on main level with bedroom/bathroom Home Equipment: Rolling Walker (2 wheels);Cane - single point;Tub bench;Toilet riser      Prior Function Prior Level of Function : Independent/Modified Independent             Mobility Comments: Retired Ecologist; drives; walks with a cane ADLs Comments: Independent     Hand Dominance        Extremity/Trunk Assessment   Upper Extremity Assessment Upper Extremity Assessment: Generalized weakness    Lower Extremity Assessment Lower Extremity Assessment: Generalized weakness    Cervical / Trunk Assessment Cervical / Trunk Assessment: Kyphotic  Communication   Communication: No difficulties  Cognition Arousal/Alertness: Awake/alert Behavior During Therapy: Flat affect Overall Cognitive Status: Within Functional Limits for tasks assessed                                          General Comments      Exercises     Assessment/Plan    PT Assessment Patient needs continued PT services  PT Problem List Decreased strength;Decreased mobility;Decreased activity tolerance;Decreased balance;Decreased safety awareness;Decreased knowledge of use of DME       PT Treatment Interventions Gait training;Balance training;Stair training;Functional mobility training;Therapeutic activities;Patient/family education;Therapeutic exercise;DME instruction    PT Goals (Current goals can be found in the Care Plan section)  Acute Rehab PT Goals Patient Stated Goal: be able to return home PT Goal Formulation: With patient Time For Goal Achievement: 03/05/22 Potential to Achieve Goals: Fair    Frequency Min 3X/week     Co-evaluation               AM-PAC PT "6 Clicks" Mobility  Outcome Measure Help needed turning from your back to your side while in a flat bed without using bedrails?: A Little Help needed moving from lying on your back to sitting on the side of a flat bed without using bedrails?: A Lot Help  needed moving to and from a bed to a chair (including a wheelchair)?: A Little Help needed standing up from a chair using your arms (e.g., wheelchair or bedside chair)?: A Little Help needed to walk in hospital room?: A Lot Help needed climbing 3-5 steps with a railing? : Total 6 Click Score: 14    End of Session Equipment Utilized During Treatment: Oxygen Activity Tolerance: Patient tolerated treatment well Patient left: in bed;with call bell/phone within reach;with nursing/sitter in room (pt returned to bed for transport for IR) Nurse Communication: Mobility status PT Visit Diagnosis: Other abnormalities of gait and mobility (R26.89);Difficulty in walking, not elsewhere classified (R26.2);Muscle weakness (generalized) (M62.81)    Time: 5681-2751 PT Time Calculation (min) (ACUTE ONLY): 26 min   Charges:   PT Evaluation $PT Eval Moderate Complexity: 1 Mod PT Treatments $Gait Training: 8-22 mins        Karina Nofsinger P, PT Acute Rehabilitation Services Pager: [phone] Office: [place] 02/19/2022, 1:43 PM

## 2022-02-19 NOTE — Progress Notes (Signed)
eLink Physician-Brief Progress Note Patient Name: Jose Wolfe DOB: [birth year] MRN: [id number]   Date of Service  02/19/2022  HPI/Events of Note  Multiple issues: 1. Na+ = 131 and 2. Ca++ = 6.7 which corrects to 7.82 (Low) given Albumin = 2.6. LVEF = 60-65%.  eICU Interventions  Plan: Will replace Ca++. 0.9 NaCl IV infusion at 50 mL/hour.     Intervention Category Major Interventions: Electrolyte abnormality - evaluation and management  [name] 02/19/2022, 6:01 AM

## 2022-02-19 NOTE — Progress Notes (Signed)
Referring Physician(s): Dr Charlsie Quest  Supervising Physician: Corrie Mckusick  Patient Status:  [facility] - In-pt  Chief Complaint:  Bilat PE Thrombectomy in IR yesterday  Subjective:  Procedure: bilateral PE thrombectomy with Penumbra 39fr suction device   Complications: None Findings: Successful removal of acute clot bilaterally  Pt is up in bed 02 in place In NAD Says cant tell that breathing is better; but no worse Pain is less to breathe  Allergies: Atorvastatin  Medications: Prior to Admission medications   Medication Sig Start Date End Date Taking? Authorizing Provider  acetaminophen (TYLENOL) 500 MG tablet Take 1,000 mg by mouth every 6 (six) hours as needed for moderate pain or headache.   Yes [provider]  B Complex Vitamins (VITAMIN B COMPLEX) TABS Take 1 tablet by mouth at bedtime.   Yes [provider]  betamethasone dipropionate (DIPROLENE) 0.05 % cream Apply 1 application topically 2 (two) times daily as needed (for psoriasis).  10/28/18  Yes [provider]  budesonide-formoterol (SYMBICORT) 160-4.5 MCG/ACT inhaler Inhale 2 puffs into the lungs 2 (two) times daily.    Yes [provider]  calcium-vitamin D (OSCAL WITH D) 500-200 MG-UNIT tablet Take 1 tablet by mouth 2 (two) times daily.   Yes [provider]  docusate sodium (COLACE) 100 MG capsule Take 100 mg by mouth at bedtime.   Yes [provider]  guaiFENesin (MUCINEX) 600 MG 12 hr tablet Take 600 mg by mouth 2 (two) times daily as needed for cough.   Yes [provider]  leuprolide, 6 Month, (LEUPROLIDE ACETATE, 6 MONTH,) 45 MG injection Inject 45 mg into the skin every 6 (six) months.   Yes [provider]  lidocaine-prilocaine (EMLA) cream Apply 1 application topically as needed. Patient taking differently: Apply 1 application topically as needed (access port). 09/23/21  Yes Wyatt Portela, MD  prochlorperazine (COMPAZINE) 10 MG  tablet Take 1 tablet (10 mg total) by mouth every 6 (six) hours as needed for nausea or vomiting. 11/05/21  Yes Wyatt Portela, MD  senna-docusate (SENNA S) 8.6-50 MG tablet Take 1 tablet by mouth at bedtime as needed for mild constipation. 01/07/22  Yes Wyatt Portela, MD  triamcinolone cream (KENALOG) 0.1 % Apply 1 application topically 2 (two) times daily as needed (for psoriasis). 10/28/18  Yes [provider]  VENTOLIN HFA 108 (90 Base) MCG/ACT inhaler INHALE 1 PUFF BY MOUTH EVERY 4 HOURS AS NEEDED Patient taking differently: Inhale 1 puff into the lungs every 4 (four) hours as needed for wheezing or shortness of breath. 09/10/21  Yes Croitoru, Mihai, MD  megestrol (MEGACE ES) 625 MG/5ML suspension Take 5 mLs (625 mg total) by mouth daily. Patient not taking: Reported on 02/18/2022 01/02/22   Wyatt Portela, MD     Vital Signs: BP (!) 88/55    Pulse (!) 102    Temp (!) 100.7 F (38.2 C) (Axillary)    Resp 20    Ht 6' (1.829 m)    Wt 155 lb 3.3 oz (70.4 kg)    SpO2 96%    BMI 21.05 kg/m   Physical Exam Vitals reviewed.  Cardiovascular:     Rate and Rhythm: Normal rate and regular rhythm.  Pulmonary:     Effort: Pulmonary effort is normal.     Breath sounds: Normal breath sounds.  Skin:    General: Skin is warm.     Comments: Right groin site is clean and dry NT No hematoma  Neurological:     Mental Status: He is alert.    Imaging: CT Angio Chest PE W and/or Wo Contrast  Addendum Date: 02/18/2022   ADDENDUM REPORT: 02/18/2022 00:07 ADDENDUM: Results were discussed with Dr. Doren Custard at 12:04 a.m. [place] on February 18, 2022. Electronically Signed   By: Virgina Norfolk M.D.   On: 02/18/2022 00:07   Result Date: 02/18/2022 CLINICAL DATA:  New onset hypoxia. EXAM: CT ANGIOGRAPHY CHEST WITH CONTRAST TECHNIQUE: Multidetector CT imaging of the chest was performed using the standard protocol during bolus administration of intravenous contrast. Multiplanar CT image reconstructions  and MIPs were obtained to evaluate the vascular anatomy. RADIATION DOSE REDUCTION: This exam was performed according to the departmental dose-optimization program which includes automated exposure control, adjustment of the mA and/or kV according to patient size and/or use of iterative reconstruction technique. CONTRAST:  50mL OMNIPAQUE IOHEXOL 350 MG/ML SOLN COMPARISON:  January 09, 2022 FINDINGS: Cardiovascular: Satisfactory opacification of the pulmonary arteries to the segmental level. Extensive intraluminal filling defects are seen within the distal aspects of the bilateral pulmonary arteries and numerous bilateral upper lobe, right middle lobe and bilateral lower lobe branches. There is no evidence of saddle embolus. Normal heart size with right heart strain noted (RV/LV ratio of approximately 2.27). No pericardial effusion. Mediastinum/Nodes: No enlarged mediastinal, hilar, or axillary lymph nodes. Thyroid gland, trachea, and esophagus demonstrate no significant findings. Lungs/Pleura: Lungs are clear. No pleural effusion or pneumothorax. Upper Abdomen: There is a small hiatal hernia. Moderate severity diffuse calcification of the right adrenal gland is seen, with mild diffuse left adrenal gland enlargement. Musculoskeletal: Multilevel degenerative changes are seen throughout the thoracic spine. Numerous, ill-defined sclerotic areas are also seen throughout the thoracic spine and bilateral ribs. Review of the MIP images confirms the above findings. IMPRESSION: 1. Extensive bilateral pulmonary embolism with evidence of right heart strain (RV/LV ratio of approximately 2.27). 2. Findings consistent with diffuse osseous metastatic disease. 3. Small hiatal hernia. Electronically Signed: By: Virgina Norfolk M.D. On: 02/18/2022 00:01   IR Angiogram Pulmonary Bilateral Selective  Result Date: 02/18/2022 INDICATION: Acute sub massive bilateral central pulmonary emboli with evidence of right heart strain by echo,  troponin leak, and oxygen requirement. EXAM: ULTRASOUND GUIDANCE FOR VASCULAR ACCESS BILATERAL PULMONARY ARTERY CATHETERIZATIONS AND ANGIOGRAMS BILATERAL PULMONARY EMBOLI SUCTION THROMBECTOMY WITH THE PENUMBRA 16 FRENCH DEVICE COMPARISON:  02/17/2022 MEDICATIONS: 1% lidocaine local, 6000 units heparin, 250 mg normal saline bolus ANESTHESIA/SEDATION: Moderate (conscious) sedation was employed during this procedure. A total of Versed 2.5 mg and Fentanyl 75 mcg was administered intravenously by the radiology nurse. Total intra-service moderate Sedation Time: 101 minutes. The patient's level of consciousness and vital signs were monitored continuously by radiology nursing throughout the procedure under my direct supervision. FLUOROSCOPY: Radiation Exposure Index (as provided by the fluoroscopic device): 562 mGy Kerma COMPLICATIONS: None immediate. TECHNIQUE: Informed written consent was obtained from the patient after a thorough discussion of the procedural risks, benefits and alternatives. All questions were addressed. Maximal Sterile Barrier Technique was utilized including caps, mask, sterile gowns, sterile gloves, sterile drape, hand hygiene and skin antiseptic. A timeout was performed prior to the initiation of the procedure. Previous imaging reviewed. Under sterile conditions and local anesthesia, ultrasound micropuncture needle access performed of the patent right common femoral vein. Images obtained for documentation of the patent right common femoral vein. Transitional dilator advanced followed by guidewire exchanged for a Amplatz guidewire. Transitional dilatation performed to insert the 16 [place] Gore dry seal sheath (33 cm length).  This was position in the suprarenal IVC. Angled pulmonary artery catheter was utilized to navigate through the right heart initially into the left pulmonary artery. Left pulmonary angiogram performed. Bulky central nearly occlusive branching filling defects present in the left  pulmonary artery vasculature. Initial central pulmonary artery pressure measurement quickly obtained: 32/8 (16). Amplatze guidewire advanced into the left lower lobe pulmonary artery. The 4 French penumbra thrombectomy device was advanced easily over the Amplatz guidewire into the hilar pulmonary artery. Position confirmed with fluoroscopy. Images obtained for documentation. Suction thrombectomy performed yielding a moderate volume of fresh clot fragments. At one point, the penumbra 16 French catheter was removed while under suction for removal of additional clot. Pulmonary artery catheter had to be readvanced and navigated through the right heart again to access the right pulmonary artery vasculature. Right pulmonary angiogram performed. Similar central hilar branching filling defects present into the right lower lobe pulmonary arteries. This correlates with the CTA. Amplatz guidewire inserted. Penumbra device tracted easily over the Amplatz guidewire into the right hilar pulmonary vasculature. Suction thrombectomy performed with micro advancements and retractions to remove thrombus. At 1 point, the suction catheter was occluded with thrombus and had to be removed over a guidewire to completely remove additional clot. Following clot removal, repeat right central pulmonary angiogram demonstrates significant reduction in thrombus burden with improved right pulmonary artery flow. Repeat left pulmonary angiogram also performed by manipulating the penumbra catheter back to the left hilar branches. This confirms residual filling defect/thrombus. Additional suction thrombectomy performed to remove more acute clot from the central left pulmonary vasculature. Repeat injection of the left pulmonary arteries confirms improvement in the clot burden and vascular flow. At this point, post thrombectomy pressure measurements obtained: 19/3 (7). Access removed. Hemostasis obtained with a pursestring suture at the right common  femoral access site. ACT monitor during the procedure with a goal of 250 which required 6000 units of IV heparin during the case. Completion ACT sheath removal was 200. FINDINGS: Imaging confirms improvement in the overall bilateral pulmonary artery clot burden following suction thrombectomy. IMPRESSION: Successful bilateral acute pulmonary emboli suction thrombectomy with the 16 French penumbra device. Reduction in pulmonary artery pressure from 32/8 (16) to 19/3 (7) Electronically Signed   By: Jerilynn Mages.  Shick M.D.   On: 02/18/2022 13:21   IR THROMBECT PRIM MECH INIT (INCLU) MOD SED  Result Date: 02/18/2022 INDICATION: Acute sub massive bilateral central pulmonary emboli with evidence of right heart strain by echo, troponin leak, and oxygen requirement. EXAM: ULTRASOUND GUIDANCE FOR VASCULAR ACCESS BILATERAL PULMONARY ARTERY CATHETERIZATIONS AND ANGIOGRAMS BILATERAL PULMONARY EMBOLI SUCTION THROMBECTOMY WITH THE PENUMBRA 16 FRENCH DEVICE COMPARISON:  02/17/2022 MEDICATIONS: 1% lidocaine local, 6000 units heparin, 250 mg normal saline bolus ANESTHESIA/SEDATION: Moderate (conscious) sedation was employed during this procedure. A total of Versed 2.5 mg and Fentanyl 75 mcg was administered intravenously by the radiology nurse. Total intra-service moderate Sedation Time: 101 minutes. The patient's level of consciousness and vital signs were monitored continuously by radiology nursing throughout the procedure under my direct supervision. FLUOROSCOPY: Radiation Exposure Index (as provided by the fluoroscopic device): 924 mGy Kerma COMPLICATIONS: None immediate. TECHNIQUE: Informed written consent was obtained from the patient after a thorough discussion of the procedural risks, benefits and alternatives. All questions were addressed. Maximal Sterile Barrier Technique was utilized including caps, mask, sterile gowns, sterile gloves, sterile drape, hand hygiene and skin antiseptic. A timeout was performed prior to the  initiation of the procedure. Previous imaging reviewed. Under sterile conditions and local  anesthesia, ultrasound micropuncture needle access performed of the patent right common femoral vein. Images obtained for documentation of the patent right common femoral vein. Transitional dilator advanced followed by guidewire exchanged for a Amplatz guidewire. Transitional dilatation performed to insert the 16 [place] Gore dry seal sheath (33 cm length). This was position in the suprarenal IVC. Angled pulmonary artery catheter was utilized to navigate through the right heart initially into the left pulmonary artery. Left pulmonary angiogram performed. Bulky central nearly occlusive branching filling defects present in the left pulmonary artery vasculature. Initial central pulmonary artery pressure measurement quickly obtained: 32/8 (16). Amplatze guidewire advanced into the left lower lobe pulmonary artery. The 62 French penumbra thrombectomy device was advanced easily over the Amplatz guidewire into the hilar pulmonary artery. Position confirmed with fluoroscopy. Images obtained for documentation. Suction thrombectomy performed yielding a moderate volume of fresh clot fragments. At one point, the penumbra 16 French catheter was removed while under suction for removal of additional clot. Pulmonary artery catheter had to be readvanced and navigated through the right heart again to access the right pulmonary artery vasculature. Right pulmonary angiogram performed. Similar central hilar branching filling defects present into the right lower lobe pulmonary arteries. This correlates with the CTA. Amplatz guidewire inserted. Penumbra device tracted easily over the Amplatz guidewire into the right hilar pulmonary vasculature. Suction thrombectomy performed with micro advancements and retractions to remove thrombus. At 1 point, the suction catheter was occluded with thrombus and had to be removed over a guidewire to completely  remove additional clot. Following clot removal, repeat right central pulmonary angiogram demonstrates significant reduction in thrombus burden with improved right pulmonary artery flow. Repeat left pulmonary angiogram also performed by manipulating the penumbra catheter back to the left hilar branches. This confirms residual filling defect/thrombus. Additional suction thrombectomy performed to remove more acute clot from the central left pulmonary vasculature. Repeat injection of the left pulmonary arteries confirms improvement in the clot burden and vascular flow. At this point, post thrombectomy pressure measurements obtained: 19/3 (7). Access removed. Hemostasis obtained with a pursestring suture at the right common femoral access site. ACT monitor during the procedure with a goal of 250 which required 6000 units of IV heparin during the case. Completion ACT sheath removal was 200. FINDINGS: Imaging confirms improvement in the overall bilateral pulmonary artery clot burden following suction thrombectomy. IMPRESSION: Successful bilateral acute pulmonary emboli suction thrombectomy with the 16 French penumbra device. Reduction in pulmonary artery pressure from 32/8 (16) to 19/3 (7) Electronically Signed   By: Jerilynn Mages.  Shick M.D.   On: 02/18/2022 13:21   IR US Guide Vasc Access Right  Result Date: 02/18/2022 INDICATION: Acute sub massive bilateral central pulmonary emboli with evidence of right heart strain by echo, troponin leak, and oxygen requirement. EXAM: ULTRASOUND GUIDANCE FOR VASCULAR ACCESS BILATERAL PULMONARY ARTERY CATHETERIZATIONS AND ANGIOGRAMS BILATERAL PULMONARY EMBOLI SUCTION THROMBECTOMY WITH THE PENUMBRA 16 FRENCH DEVICE COMPARISON:  02/17/2022 MEDICATIONS: 1% lidocaine local, 6000 units heparin, 250 mg normal saline bolus ANESTHESIA/SEDATION: Moderate (conscious) sedation was employed during this procedure. A total of Versed 2.5 mg and Fentanyl 75 mcg was administered intravenously by the radiology  nurse. Total intra-service moderate Sedation Time: 101 minutes. The patient's level of consciousness and vital signs were monitored continuously by radiology nursing throughout the procedure under my direct supervision. FLUOROSCOPY: Radiation Exposure Index (as provided by the fluoroscopic device): 629 mGy Kerma COMPLICATIONS: None immediate. TECHNIQUE: Informed written consent was obtained from the patient after a thorough discussion of the procedural  risks, benefits and alternatives. All questions were addressed. Maximal Sterile Barrier Technique was utilized including caps, mask, sterile gowns, sterile gloves, sterile drape, hand hygiene and skin antiseptic. A timeout was performed prior to the initiation of the procedure. Previous imaging reviewed. Under sterile conditions and local anesthesia, ultrasound micropuncture needle access performed of the patent right common femoral vein. Images obtained for documentation of the patent right common femoral vein. Transitional dilator advanced followed by guidewire exchanged for a Amplatz guidewire. Transitional dilatation performed to insert the 16 [place] Gore dry seal sheath (33 cm length). This was position in the suprarenal IVC. Angled pulmonary artery catheter was utilized to navigate through the right heart initially into the left pulmonary artery. Left pulmonary angiogram performed. Bulky central nearly occlusive branching filling defects present in the left pulmonary artery vasculature. Initial central pulmonary artery pressure measurement quickly obtained: 32/8 (16). Amplatze guidewire advanced into the left lower lobe pulmonary artery. The 33 French penumbra thrombectomy device was advanced easily over the Amplatz guidewire into the hilar pulmonary artery. Position confirmed with fluoroscopy. Images obtained for documentation. Suction thrombectomy performed yielding a moderate volume of fresh clot fragments. At one point, the penumbra 16 French catheter was  removed while under suction for removal of additional clot. Pulmonary artery catheter had to be readvanced and navigated through the right heart again to access the right pulmonary artery vasculature. Right pulmonary angiogram performed. Similar central hilar branching filling defects present into the right lower lobe pulmonary arteries. This correlates with the CTA. Amplatz guidewire inserted. Penumbra device tracted easily over the Amplatz guidewire into the right hilar pulmonary vasculature. Suction thrombectomy performed with micro advancements and retractions to remove thrombus. At 1 point, the suction catheter was occluded with thrombus and had to be removed over a guidewire to completely remove additional clot. Following clot removal, repeat right central pulmonary angiogram demonstrates significant reduction in thrombus burden with improved right pulmonary artery flow. Repeat left pulmonary angiogram also performed by manipulating the penumbra catheter back to the left hilar branches. This confirms residual filling defect/thrombus. Additional suction thrombectomy performed to remove more acute clot from the central left pulmonary vasculature. Repeat injection of the left pulmonary arteries confirms improvement in the clot burden and vascular flow. At this point, post thrombectomy pressure measurements obtained: 19/3 (7). Access removed. Hemostasis obtained with a pursestring suture at the right common femoral access site. ACT monitor during the procedure with a goal of 250 which required 6000 units of IV heparin during the case. Completion ACT sheath removal was 200. FINDINGS: Imaging confirms improvement in the overall bilateral pulmonary artery clot burden following suction thrombectomy. IMPRESSION: Successful bilateral acute pulmonary emboli suction thrombectomy with the 16 French penumbra device. Reduction in pulmonary artery pressure from 32/8 (16) to 19/3 (7) Electronically Signed   By: Jerilynn Mages.  Shick M.D.    On: 02/18/2022 13:21   DG Chest Port 1 View  Result Date: 02/17/2022 CLINICAL DATA:  Concern for sepsis EXAM: PORTABLE CHEST 1 VIEW COMPARISON:  01/09/2022 FINDINGS: Cardiac and mediastinal contours are within normal limits. Redemonstrated right chest port with catheter tip in the low SVC. No focal pulmonary opacity. No pleural effusion or pneumothorax. No acute osseous abnormality. Degenerative changes in the bilateral shoulders. IMPRESSION: No acute cardiopulmonary process. Electronically Signed   By: Merilyn Baba M.D.   On: 02/17/2022 22:03   ECHOCARDIOGRAM COMPLETE  Result Date: 02/18/2022    ECHOCARDIOGRAM REPORT   Patient Name:   Jose Wolfe [facility] Date of Exam: 02/18/2022 Medical  Rec #:  [id number]               Height:       72.0 in Accession #:    [id number]              Weight:       155.2 lb Date of Birth:  08/03/1946               BSA:          1.913 m Patient Age:    76 years                BP:           119/66 mmHg Patient Gender: M                       HR:           90 bpm. Exam Location:  Inpatient Procedure: 2D Echo, Cardiac Doppler, Color Doppler and Strain Analysis                STAT ECHO  Dr Tamala Julian and [facility] both have seen echo. Indications:    Pulmonary embolus  History:        Patient has prior history of Echocardiogram examinations, most                 recent 10/09/2021. COPD; Signs/Symptoms:Chest Pain.  Sonographer:    Luisa Hart RDCS Referring Phys: [id number] RAHUL P DESAI IMPRESSIONS  1. Left ventricular ejection fraction, by estimation, is 60 to 65%. The left ventricle has normal function. The left ventricle has no regional wall motion abnormalities. Left ventricular diastolic parameters are consistent with Grade I diastolic dysfunction (impaired relaxation). There is the interventricular septum is flattened in systole and diastole, consistent with right ventricular pressure and volume overload.  2. Right ventricular systolic function is severely reduced. The right  ventricular size is moderately enlarged. There is mildly elevated pulmonary artery systolic pressure. The estimated right ventricular systolic pressure is 81.8 mmHg.  3. Right atrial size was moderately dilated.  4. The mitral valve is normal in structure. No evidence of mitral valve regurgitation. No evidence of mitral stenosis.  5. Tricuspid valve regurgitation is moderate.  6. The aortic valve is normal in structure. There is mild calcification of the aortic valve. There is mild thickening of the aortic valve. Aortic valve regurgitation is trivial. Aortic valve sclerosis is present, with no evidence of aortic valve stenosis. Aortic valve mean gradient measures 2.0 mmHg. Aortic valve Vmax measures 1.16 m/s.  7. The inferior vena cava is dilated in size with >50% respiratory variability, suggesting right atrial pressure of 8 mmHg. Comparison(s): Prior images reviewed side by side. RV function and size has worsened when compared to prior ECHO. FINDINGS  Left Ventricle: Left ventricular ejection fraction, by estimation, is 60 to 65%. The left ventricle has normal function. The left ventricle has no regional wall motion abnormalities. Global longitudinal strain performed but not reported based on interpreter judgement due to suboptimal tracking. The left ventricular internal cavity size was normal in size. There is no left ventricular hypertrophy. The interventricular septum is flattened in systole and diastole, consistent with right ventricular pressure and volume overload. Left ventricular diastolic parameters are consistent with Grade I diastolic dysfunction (impaired relaxation). Right Ventricle: The right ventricular size is moderately enlarged. No increase in right ventricular wall thickness. Right ventricular systolic function is severely reduced. There is mildly elevated pulmonary artery systolic  pressure. The tricuspid regurgitant velocity is 2.81 m/s, and with an assumed right atrial pressure of 8 mmHg, the  estimated right ventricular systolic pressure is 37.9 mmHg. Left Atrium: Left atrial size was normal in size. Right Atrium: Right atrial size was moderately dilated. Pericardium: There is no evidence of pericardial effusion. Mitral Valve: The mitral valve is normal in structure. No evidence of mitral valve regurgitation. No evidence of mitral valve stenosis. Tricuspid Valve: The tricuspid valve is normal in structure. Tricuspid valve regurgitation is moderate . No evidence of tricuspid stenosis. Aortic Valve: The aortic valve is normal in structure. There is mild calcification of the aortic valve. There is mild thickening of the aortic valve. Aortic valve regurgitation is trivial. Aortic valve sclerosis is present, with no evidence of aortic valve stenosis. Aortic valve mean gradient measures 2.0 mmHg. Aortic valve peak gradient measures 5.4 mmHg. Aortic valve area, by VTI measures 3.23 cm. Pulmonic Valve: The pulmonic valve was normal in structure. Pulmonic valve regurgitation is not visualized. No evidence of pulmonic stenosis. Aorta: The aortic root is normal in size and structure. Venous: The inferior vena cava is dilated in size with greater than 50% respiratory variability, suggesting right atrial pressure of 8 mmHg. IAS/Shunts: No atrial level shunt detected by color flow Doppler.  LEFT VENTRICLE PLAX 2D LVIDd:         4.20 cm     Diastology LVIDs:         2.60 cm     LV e' medial:    5.66 cm/s LV PW:         0.80 cm     LV E/e' medial:  7.9 LV IVS:        1.10 cm     LV e' lateral:   7.83 cm/s LVOT diam:     2.30 cm     LV E/e' lateral: 5.7 LV SV:         49 LV SV Index:   26 LVOT Area:     4.15 cm  LV Volumes (MOD) LV vol d, MOD A2C: 68.8 ml LV vol d, MOD A4C: 52.2 ml LV vol s, MOD A2C: 19.0 ml LV vol s, MOD A4C: 29.0 ml LV SV MOD A2C:     49.8 ml LV SV MOD A4C:     52.2 ml LV SV MOD BP:      35.9 ml RIGHT VENTRICLE RV Basal diam:  4.80 cm RV Mid diam:    4.80 cm RV S prime:     11.00 cm/s TAPSE (M-mode):  1.6 cm LEFT ATRIUM             Index        RIGHT ATRIUM           Index LA diam:        2.60 cm 1.36 cm/m   RA Area:     23.60 cm LA Vol (A2C):   18.3 ml 9.57 ml/m   RA Volume:   79.00 ml  41.30 ml/m LA Vol (A4C):   20.3 ml 10.61 ml/m LA Biplane Vol: 20.5 ml 10.72 ml/m  AORTIC VALVE                    PULMONIC VALVE AV Area (Vmax):    3.55 cm     PV Vmax:          1.06 m/s AV Area (Vmean):   2.98 cm     PV Vmean:  65.300 cm/s AV Area (VTI):     3.23 cm     PV VTI:           0.136 m AV Vmax:           116.00 cm/s  PV Peak grad:     4.5 mmHg AV Vmean:          71.200 cm/s  PV Mean grad:     2.0 mmHg AV VTI:            0.152 m      PR End Diast Vel: 3.21 msec AV Peak Grad:      5.4 mmHg AV Mean Grad:      2.0 mmHg LVOT Vmax:         99.20 cm/s LVOT Vmean:        51.100 cm/s LVOT VTI:          0.118 m LVOT/AV VTI ratio: 0.78  AORTA Ao Asc diam: 3.50 cm MITRAL VALVE               TRICUSPID VALVE MV Area (PHT): 3.79 cm    TR Peak grad:   31.6 mmHg MV Decel Time: 200 msec    TR Vmax:        281.00 cm/s MV E velocity: 44.60 cm/s                            SHUNTS                            Systemic VTI:  0.12 m                            Systemic Diam: 2.30 cm Candee Furbish MD Electronically signed by Candee Furbish MD Signature Date/Time: 02/18/2022/8:36:35 AM    Final    VAS [place] LOWER EXTREMITY VENOUS (DVT)  Result Date: 02/18/2022  Lower Venous DVT Study Patient Name:  DINARI STGERMAINE [facility]  Date of Exam:   02/18/2022 Medical Rec #: [id number]                Accession #:    [id number] Date of Birth: 1946-12-24                Patient Gender: M Patient Age:   26 years Exam Location:  [facility] Procedure:      VAS [place] LOWER EXTREMITY VENOUS (DVT) Referring Phys: Montey Hora --------------------------------------------------------------------------------  Indications: Pulmonary embolism.  Risk Factors: Confirmed PE DVT. Anticoagulation: Heparin. Limitations: Poor ultrasound/tissue interface and  bandages. Comparison Study: 02/12/2022 - RIGHT:                   - No evidence of deep vein thrombosis in the lower extremity.                   No indirect                   evidence of obstruction proximal to the inguinal ligament.                    LEFT:                   - There is no evidence of deep vein thrombosis in the lower  extremity.                   - There is no evidence of superficial venous thrombosis.                    - No cystic structure found in the popliteal fossa.                   - Limited imaging suggests extrinsic compression/ obstruction                   of left                   common iliac vein.                   - Possible obstruction proximal to the inguinal ligament. Performing Technologist: Oliver Hum RVT  Examination Guidelines: A complete evaluation includes B-mode imaging, spectral Doppler, color Doppler, and power Doppler as needed of all accessible portions of each vessel. Bilateral testing is considered an integral part of a complete examination. Limited examinations for reoccurring indications may be performed as noted. The reflux portion of the exam is performed with the patient in reverse Trendelenburg.  +---------+---------------+---------+-----------+----------+-------------------+  RIGHT     Compressibility Phasicity Spontaneity Properties Thrombus Aging       +---------+---------------+---------+-----------+----------+-------------------+  CFV                                                        Not well visualized  +---------+---------------+---------+-----------+----------+-------------------+  SFJ                                                        Not well visualized  +---------+---------------+---------+-----------+----------+-------------------+  FV Prox   Full            Yes       Yes                                         +---------+---------------+---------+-----------+----------+-------------------+  FV Mid    Full                                                                   +---------+---------------+---------+-----------+----------+-------------------+  FV Distal Full                                                                  +---------+---------------+---------+-----------+----------+-------------------+  PFV       Full            Yes       Yes                                         +---------+---------------+---------+-----------+----------+-------------------+  POP       Full            Yes       Yes                                         +---------+---------------+---------+-----------+----------+-------------------+  PTV       Full                                                                  +---------+---------------+---------+-----------+----------+-------------------+  PERO      Full                                                                  +---------+---------------+---------+-----------+----------+-------------------+   +---------+---------------+---------+-----------+----------+-------------------+  LEFT      Compressibility Phasicity Spontaneity Properties Thrombus Aging       +---------+---------------+---------+-----------+----------+-------------------+  CFV       Partial         No        No                     Acute                +---------+---------------+---------+-----------+----------+-------------------+  SFJ       Full                                                                  +---------+---------------+---------+-----------+----------+-------------------+  FV Prox   Partial         No        No                     Acute                +---------+---------------+---------+-----------+----------+-------------------+  FV Mid    Partial         No        No                     Acute                +---------+---------------+---------+-----------+----------+-------------------+  FV Distal Partial         No        No                     Acute                 +---------+---------------+---------+-----------+----------+-------------------+  PFV       Partial         No        No  Acute                +---------+---------------+---------+-----------+----------+-------------------+  POP       Full            No        No                                          +---------+---------------+---------+-----------+----------+-------------------+  PTV       Full                                                                  +---------+---------------+---------+-----------+----------+-------------------+  PERO                                                       Not well visualized  +---------+---------------+---------+-----------+----------+-------------------+  EIV       Partial         No        No                     Acute                +---------+---------------+---------+-----------+----------+-------------------+  CIV                       Yes       Yes                                         +---------+---------------+---------+-----------+----------+-------------------+ The CIV, and distal IVC appears patent.    Summary: RIGHT: - There is no evidence of deep vein thrombosis in the lower extremity. However, portions of this examination were limited- see technologist comments above.  - No cystic structure found in the popliteal fossa.  LEFT: - Findings consistent with acute deep vein thrombosis involving the left external ilic vein, left common femoral vein, left femoral vein, and left proximal profunda vein. - No cystic structure found in the popliteal fossa.  *See table(s) above for measurements and observations. Electronically signed by Harold Barban MD on 02/18/2022 at 10:32:15 PM.    Final     Labs:  CBC: Recent Labs    02/04/22 1125 02/17/22 2140 02/18/22 0214 02/19/22 0109  WBC 13.3* 16.9* 15.0* 13.7*  HGB 7.5* 10.1* 9.5* 9.0*  HCT 23.2* 31.4* 29.2* 28.2*  PLT 485* 336 289 210    COAGS: Recent Labs    01/10/22 0304  02/17/22 2140  INR 1.1 1.2    BMP: Recent Labs    02/04/22 1125 02/17/22 2140 02/18/22 0214 02/19/22 0109  NA 136 132* 130* 131*  K 4.4 4.8 4.9 4.1  CL 102 98 99 100  CO2 28 19* 20* 18*  GLUCOSE 96 185* 144* 122*  BUN 14 13 14 15   CALCIUM 8.4* 7.8* 7.4* 6.7*  CREATININE 0.84 1.06 0.97 1.09  GFRNONAA >60 >60 >60 >60  LIVER FUNCTION TESTS: Recent Labs    01/10/22 0304 01/28/22 0905 02/04/22 1125 02/17/22 2140  BILITOT 0.6 0.3 0.3 0.7  AST 128* 15 22 49*  ALT 16 13 15 27   ALKPHOS 77 91 111 79  PROT 5.8* 6.4* 6.1* 6.1*  ALBUMIN 2.9* 3.2* 3.3* 2.6*    Assessment and Plan:  Bilat PE Thrombectomy in IR yesterday Less pain to breathe per pt- but breathing not really easier per pt Will check again in am  Electronically Signed: Lavonia Drafts, PA-C 02/19/2022, 8:57 AM   I spent a total of 15 Minutes at the the patient's bedside AND on the patient's hospital floor or unit, greater than 50% of which was counseling/coordinating care for PE thrombectomy

## 2022-02-19 NOTE — Progress Notes (Signed)
Patient ID: Jose Wolfe, male   DOB: [birth year], 76 y.o.   MRN: [id number]   Bilat PE thrombectomy yesterday in IR See previous progress note  Discussed with Dr Annamaria Boots He recommends Rt groin suture removal at bedside  Suture removed without complication Dressing placed

## 2022-02-19 NOTE — Progress Notes (Signed)
Pharmacy Antibiotic Note  Jose Wolfe is a 76 y.o. male admitted on 02/17/2022 with bacteremia.  Pharmacy has been consulted for Vancomycin dosing.  Mr. Jose Wolfe is a 76 year old male admitted with a PE. He underwent a thrombectomy on 2/22. Blood cultures from 2/21 with MRSE in 4/4 bottles and low grade temperature this am of 100.7.  Plan: Start Vancomycin 1500 mg IV x1 dose followed by Vancomcyin 1250 mg IV q24h (eAUC 481, Scr 1.09) Monitor renal function, micro data, and clinical status Vanc levels as indicated  Height: 6' (182.9 cm) Weight: 70.4 kg (155 lb 3.3 oz) IBW/kg (Calculated) : 77.6  Temp (24hrs), Avg:99.3 F (37.4 C), Min:98.5 F (36.9 C), Max:100.7 F (38.2 C)  Recent Labs  Lab 02/17/22 2140 02/17/22 2334 02/18/22 0214 02/18/22 0224 02/19/22 0109  WBC 16.9*  --  15.0*  --  13.7*  CREATININE 1.06  --  0.97  --  1.09  LATICACIDVEN 5.1* 4.4*  --  3.4*  --     Estimated Creatinine Clearance: 56.5 mL/min (by C-G formula based on SCr of 1.09 mg/dL).    Allergies  Allergen Reactions   Atorvastatin     Other reaction(s): Myalgias    Antimicrobials this admission: Ceftriaxone 2/22 >>  Vancomycin 2/23 >>   Microbiology results: 2/21 BCx: MRSE   Thank you for allowing pharmacy to be a part of this patients care.  Lestine Box, PharmD PGY2 Infectious Diseases Pharmacy Resident   Please check [email or web address] for unit-specific pharmacy phone numbers

## 2022-02-19 NOTE — Progress Notes (Signed)
ANTICOAGULATION CONSULT NOTE - Follow Up Consult  Pharmacy Consult for Heparin Indication: pulmonary embolus  Allergies  Allergen Reactions   Atorvastatin     Other reaction(s): Myalgias    Patient Measurements: Height: 6' (182.9 cm) Weight: 70.4 kg (155 lb 3.3 oz) IBW/kg (Calculated) : 77.6 Heparin Dosing Weight: 70.4 kg  Vital Signs: Temp: 100.7 F (38.2 C) (02/23 0700) Temp Source: Axillary (02/23 0700) BP: 88/55 (02/23 0700) Pulse Rate: 102 (02/23 0706)  Labs: Recent Labs    02/17/22 2140 02/17/22 2334 02/18/22 0214 02/18/22 1001 02/18/22 1631 02/19/22 0109 02/19/22 0619  HGB 10.1*  --  9.5*  --   --  9.0*  --   HCT 31.4*  --  29.2*  --   --  28.2*  --   PLT 336  --  289  --   --  210  --   LABPROT 14.8  --   --   --   --   --   --   INR 1.2  --   --   --   --   --   --   HEPARINUNFRC  --   --   --  0.26* 0.69  --  0.31  CREATININE 1.06  --  0.97  --   --  1.09  --   TROPONINIHS 636* 722* 721*  --   --   --   --      Estimated Creatinine Clearance: 56.5 mL/min (by C-G formula based on SCr of 1.09 mg/dL).   Assessment: Jose Wolfe is a 76 year old male with bilateral PE with RHS. He is s/p thrombectomy on 2/22. He received a total of 6,000 units bolus in IR on 2/22.  Heparin level returned at lower end of goal at 0.31 this am. No issues during infusion and no signs/symptoms of bleeding. Will increase infusion slightly today to aim for middle of goal.  Goal of Therapy:  Heparin level 0.3-0.7 units/ml Monitor platelets by anticoagulation protocol: Yes   Plan:  Increase heparin to 1250 units/h Monitor CBC and signs/symptoms of bleeding Check heparin level daily  Lestine Box, PharmD PGY2 Infectious Diseases Pharmacy Resident   Please check [email or web address] for unit-specific pharmacy phone numbers

## 2022-02-19 NOTE — Procedures (Signed)
Interventional Radiology Procedure Note  Procedure: Image guided routine exchange of bilateral PCN. New 10 F placed.  Findings: The right had been mostly withdrawn from the kidney, nonfunctional.  Complications: None    Recommendations: - Routine drain care, record output   - routine wound care  Signed,  Dulcy Fanny. Earleen Newport, DO

## 2022-02-20 ENCOUNTER — Inpatient Hospital Stay (HOSPITAL_COMMUNITY): Payer: Medicare HMO

## 2022-02-20 ENCOUNTER — Inpatient Hospital Stay: Payer: Medicare HMO

## 2022-02-20 ENCOUNTER — Encounter (HOSPITAL_COMMUNITY): Payer: Self-pay

## 2022-02-20 DIAGNOSIS — I2602 Saddle embolus of pulmonary artery with acute cor pulmonale: Secondary | ICD-10-CM

## 2022-02-20 DIAGNOSIS — I2609 Other pulmonary embolism with acute cor pulmonale: Secondary | ICD-10-CM

## 2022-02-20 DIAGNOSIS — R7989 Other specified abnormal findings of blood chemistry: Secondary | ICD-10-CM

## 2022-02-20 DIAGNOSIS — C61 Malignant neoplasm of prostate: Secondary | ICD-10-CM

## 2022-02-20 DIAGNOSIS — I2699 Other pulmonary embolism without acute cor pulmonale: Secondary | ICD-10-CM | POA: Diagnosis not present

## 2022-02-20 DIAGNOSIS — R7881 Bacteremia: Secondary | ICD-10-CM

## 2022-02-20 DIAGNOSIS — E43 Unspecified severe protein-calorie malnutrition: Secondary | ICD-10-CM | POA: Insufficient documentation

## 2022-02-20 DIAGNOSIS — N179 Acute kidney failure, unspecified: Secondary | ICD-10-CM

## 2022-02-20 DIAGNOSIS — J9601 Acute respiratory failure with hypoxia: Secondary | ICD-10-CM

## 2022-02-20 HISTORY — PX: IR NEPHROSTOMY EXCHANGE RIGHT: IMG6070

## 2022-02-20 LAB — CULTURE, BLOOD (ROUTINE X 2): Special Requests: ADEQUATE

## 2022-02-20 LAB — POCT I-STAT 7, (LYTES, BLD GAS, ICA,H+H)
Acid-base deficit: 4 mmol/L — ABNORMAL HIGH (ref 0.0–2.0)
Bicarbonate: 18.3 mmol/L — ABNORMAL LOW (ref 20.0–28.0)
Calcium, Ion: 0.93 mmol/L — ABNORMAL LOW (ref 1.15–1.40)
HCT: 31 % — ABNORMAL LOW (ref 39.0–52.0)
Hemoglobin: 10.5 g/dL — ABNORMAL LOW (ref 13.0–17.0)
O2 Saturation: 86 %
Patient temperature: 97.7
Potassium: 4.2 mmol/L (ref 3.5–5.1)
Sodium: 127 mmol/L — ABNORMAL LOW (ref 135–145)
TCO2: 19 mmol/L — ABNORMAL LOW (ref 22–32)
pCO2 arterial: 24.2 mmHg — ABNORMAL LOW (ref 32–48)
pH, Arterial: 7.485 — ABNORMAL HIGH (ref 7.35–7.45)
pO2, Arterial: 44 mmHg — ABNORMAL LOW (ref 83–108)

## 2022-02-20 LAB — COMPREHENSIVE METABOLIC PANEL
ALT: 429 U/L — ABNORMAL HIGH (ref 0–44)
ALT: 824 U/L — ABNORMAL HIGH (ref 0–44)
AST: 613 U/L — ABNORMAL HIGH (ref 15–41)
AST: 1536 U/L — ABNORMAL HIGH (ref 15–41)
Albumin: 2.1 g/dL — ABNORMAL LOW (ref 3.5–5.0)
Albumin: 2 g/dL — ABNORMAL LOW (ref 3.5–5.0)
Alkaline Phosphatase: 75 U/L (ref 38–126)
Alkaline Phosphatase: 80 U/L (ref 38–126)
Anion gap: 12 (ref 5–15)
Anion gap: 16 — ABNORMAL HIGH (ref 5–15)
BUN: 25 mg/dL — ABNORMAL HIGH (ref 8–23)
BUN: 32 mg/dL — ABNORMAL HIGH (ref 8–23)
CO2: 18 mmol/L — ABNORMAL LOW (ref 22–32)
CO2: 16 mmol/L — ABNORMAL LOW (ref 22–32)
Calcium: 6.8 mg/dL — ABNORMAL LOW (ref 8.9–10.3)
Calcium: 6.8 mg/dL — ABNORMAL LOW (ref 8.9–10.3)
Chloride: 98 mmol/L (ref 98–111)
Chloride: 98 mmol/L (ref 98–111)
Creatinine, Ser: 1.26 mg/dL — ABNORMAL HIGH (ref 0.61–1.24)
Creatinine, Ser: 1.56 mg/dL — ABNORMAL HIGH (ref 0.61–1.24)
GFR, Estimated: 59 mL/min — ABNORMAL LOW (ref >60)
GFR, Estimated: 45 mL/min — ABNORMAL LOW (ref >60)
Glucose, Bld: 132 mg/dL — ABNORMAL HIGH (ref 70–99)
Glucose, Bld: 146 mg/dL — ABNORMAL HIGH (ref 70–99)
Potassium: 4.1 mmol/L (ref 3.5–5.1)
Potassium: 4.6 mmol/L (ref 3.5–5.1)
Sodium: 128 mmol/L — ABNORMAL LOW (ref 135–145)
Sodium: 130 mmol/L — ABNORMAL LOW (ref 135–145)
Total Bilirubin: 0.3 mg/dL (ref 0.3–1.2)
Total Bilirubin: 0.4 mg/dL (ref 0.3–1.2)
Total Protein: 5.2 g/dL — ABNORMAL LOW (ref 6.5–8.1)
Total Protein: 5.2 g/dL — ABNORMAL LOW (ref 6.5–8.1)

## 2022-02-20 LAB — ECHOCARDIOGRAM LIMITED
Calc EF: 63.6 %
Height: 72 in
S' Lateral: 3.1 cm
Single Plane A2C EF: 61.7 %
Single Plane A4C EF: 64.2 %
Weight: 2504.43 oz

## 2022-02-20 LAB — CBC
HCT: 24.5 % — ABNORMAL LOW (ref 39.0–52.0)
Hemoglobin: 8.2 g/dL — ABNORMAL LOW (ref 13.0–17.0)
MCH: 30.3 pg (ref 26.0–34.0)
MCHC: 33.5 g/dL (ref 30.0–36.0)
MCV: 90.4 fL (ref 80.0–100.0)
Platelets: 217 10*3/uL (ref 150–400)
RBC: 2.71 MIL/uL — ABNORMAL LOW (ref 4.22–5.81)
RDW: 16.4 % — ABNORMAL HIGH (ref 11.5–15.5)
WBC: 12.8 10*3/uL — ABNORMAL HIGH (ref 4.0–10.5)
nRBC: 0 % (ref 0.0–0.2)

## 2022-02-20 LAB — NA AND K (SODIUM & POTASSIUM), RAND UR
Potassium Urine: 86 mmol/L
Sodium, Ur: 15 mmol/L

## 2022-02-20 LAB — PHOSPHORUS: Phosphorus: 3.1 mg/dL (ref 2.5–4.6)

## 2022-02-20 LAB — OSMOLALITY, URINE: Osmolality, Ur: 376 mOsm/kg (ref 300–900)

## 2022-02-20 LAB — MAGNESIUM: Magnesium: 2.7 mg/dL — ABNORMAL HIGH (ref 1.7–2.4)

## 2022-02-20 LAB — HEPARIN LEVEL (UNFRACTIONATED)
Heparin Unfractionated: 0.18 IU/mL — ABNORMAL LOW (ref 0.30–0.70)
Heparin Unfractionated: 0.12 IU/mL — ABNORMAL LOW (ref 0.30–0.70)
Heparin Unfractionated: 0.27 IU/mL — ABNORMAL LOW (ref 0.30–0.70)

## 2022-02-20 LAB — CHLORIDE, URINE, RANDOM: Chloride Urine: 18 mmol/L

## 2022-02-20 MED ORDER — REVEFENACIN 175 MCG/3ML IN SOLN
175.0000 ug | Freq: Every day | RESPIRATORY_TRACT | Status: DC
  Administered 2022-02-20 – 2022-02-23 (×4): 175 ug via RESPIRATORY_TRACT
  Filled 2022-02-20 (×4): qty 3

## 2022-02-20 MED ORDER — HEPARIN BOLUS VIA INFUSION
2000.0000 [IU] | Freq: Once | INTRAVENOUS | Status: AC
  Administered 2022-02-20: 2000 [IU] via INTRAVENOUS
  Filled 2022-02-20: qty 2000

## 2022-02-20 MED ORDER — VANCOMYCIN HCL IN DEXTROSE 1-5 GM/200ML-% IV SOLN
1000.0000 mg | INTRAVENOUS | Status: DC
  Administered 2022-02-20 – 2022-02-21 (×2): 1000 mg via INTRAVENOUS
  Filled 2022-02-20 (×4): qty 200

## 2022-02-20 MED ORDER — PHENYLEPHRINE HCL-NACL 20-0.9 MG/250ML-% IV SOLN
0.0000 ug/min | INTRAVENOUS | Status: DC
  Administered 2022-02-20: 20 ug/min via INTRAVENOUS
  Administered 2022-02-20 – 2022-02-21 (×2): 40 ug/min via INTRAVENOUS
  Administered 2022-02-21 (×2): 30 ug/min via INTRAVENOUS
  Filled 2022-02-20 (×5): qty 250

## 2022-02-20 MED ORDER — PROCHLORPERAZINE MALEATE 10 MG PO TABS
10.0000 mg | ORAL_TABLET | Freq: Four times a day (QID) | ORAL | Status: DC | PRN
  Administered 2022-02-20 – 2022-02-22 (×3): 10 mg via ORAL
  Filled 2022-02-20 (×4): qty 1

## 2022-02-20 NOTE — Progress Notes (Signed)
Pharmacy Antibiotic Note  Jose Wolfe is a 76 y.o. male admitted on 02/17/2022 with bacteremia.  Pharmacy has been consulted for Vancomycin dosing.  Jose Wolfe is a 76 year old male admitted with a PE. He underwent a thrombectomy on 2/22. Blood cultures from 2/21 with MRSE in 4/4 bottles and low grade temperatures.  Scr is increasing- at 1.26 this morning, will adjust dose to reflect these changes. Of note, patient has only received the vancomycin loading dose.  Plan Adjust to Vancomycin 1000 mg IV q24h (eAUC 432, Scr 1.26) Monitor renal function, micro data, and clinical status Vanc levels as indicated  Height: 6' (182.9 cm) Weight: 71 kg (156 lb 8.4 oz) IBW/kg (Calculated) : 77.6  Temp (24hrs), Avg:98.9 F (37.2 C), Min:97.7 F (36.5 C), Max:100.5 F (38.1 C)  Recent Labs  Lab 02/17/22 2140 02/17/22 2334 02/18/22 0214 02/18/22 0224 02/19/22 0109 02/20/22 0235  WBC 16.9*  --  15.0*  --  13.7* 12.8*  CREATININE 1.06  --  0.97  --  1.09 1.26*  LATICACIDVEN 5.1* 4.4*  --  3.4*  --   --      Estimated Creatinine Clearance: 49.3 mL/min (A) (by C-G formula based on SCr of 1.26 mg/dL (H)).    Allergies  Allergen Reactions   Atorvastatin     Other reaction(s): Myalgias    Antimicrobials this admission: Ceftriaxone 2/22 >>  Vancomycin 2/23 >>   Microbiology results: 2/21 BCx: MRSE 2/23 Bcx: Pending   Thank you for allowing pharmacy to be a part of this patients care.  Lestine Box, PharmD PGY2 Infectious Diseases Pharmacy Resident   Please check [email or web address] for unit-specific pharmacy phone numbers

## 2022-02-20 NOTE — Progress Notes (Signed)
Supervising Physician: Aletta Edouard  Patient Status:  [facility] - In-pt  Chief Complaint:  Bacteremia, PE  Subjective:  Feeling more SOB.  Remains feeling weak.  Increased O2 need overnight  Allergies: Atorvastatin  Medications: Prior to Admission medications   Medication Sig Start Date End Date Taking? Authorizing Provider  acetaminophen (TYLENOL) 500 MG tablet Take 1,000 mg by mouth every 6 (six) hours as needed for moderate pain or headache.   Yes [provider]  B Complex Vitamins (VITAMIN B COMPLEX) TABS Take 1 tablet by mouth at bedtime.   Yes [provider]  betamethasone dipropionate (DIPROLENE) 0.05 % cream Apply 1 application topically 2 (two) times daily as needed (for psoriasis).  10/28/18  Yes [provider]  budesonide-formoterol (SYMBICORT) 160-4.5 MCG/ACT inhaler Inhale 2 puffs into the lungs 2 (two) times daily.    Yes [provider]  calcium-vitamin D (OSCAL WITH D) 500-200 MG-UNIT tablet Take 1 tablet by mouth 2 (two) times daily.   Yes [provider]  docusate sodium (COLACE) 100 MG capsule Take 100 mg by mouth at bedtime.   Yes [provider]  guaiFENesin (MUCINEX) 600 MG 12 hr tablet Take 600 mg by mouth 2 (two) times daily as needed for cough.   Yes [provider]  leuprolide, 6 Month, (LEUPROLIDE ACETATE, 6 MONTH,) 45 MG injection Inject 45 mg into the skin every 6 (six) months.   Yes [provider]  lidocaine-prilocaine (EMLA) cream Apply 1 application topically as needed. Patient taking differently: Apply 1 application topically as needed (access port). 09/23/21  Yes Wyatt Portela, MD  prochlorperazine (COMPAZINE) 10 MG tablet Take 1 tablet (10 mg total) by mouth every 6 (six) hours as needed for nausea or vomiting. 11/05/21  Yes Wyatt Portela, MD  senna-docusate (SENNA S) 8.6-50 MG tablet Take 1 tablet by mouth at bedtime as needed for mild constipation. 01/07/22  Yes Wyatt Portela, MD  triamcinolone cream (KENALOG) 0.1 % Apply 1 application topically 2 (two) times daily as needed (for psoriasis). 10/28/18  Yes [provider]  VENTOLIN HFA 108 (90 Base) MCG/ACT inhaler INHALE 1 PUFF BY MOUTH EVERY 4 HOURS AS NEEDED Patient taking differently: Inhale 1 puff into the lungs every 4 (four) hours as needed for wheezing or shortness of breath. 09/10/21  Yes Croitoru, Mihai, MD  megestrol (MEGACE ES) 625 MG/5ML suspension Take 5 mLs (625 mg total) by mouth daily. Patient not taking: Reported on 02/18/2022 01/02/22   Wyatt Portela, MD     Vital Signs: BP (!) 88/59    Pulse 99    Temp (!) 97.5 F (36.4 C) (Axillary)    Resp (!) 27    Ht 6' (1.829 m)    Wt 156 lb 8.4 oz (71 kg)    SpO2 92%    BMI 21.23 kg/m   Physical Exam Constitutional:      Appearance: He is ill-appearing.  HENT:     Head: Normocephalic and atraumatic.     Mouth/Throat:     Pharynx: Oropharynx is clear.  Eyes:     Extraocular Movements: Extraocular movements intact.  Cardiovascular:     Rate and Rhythm: Tachycardia present.  Pulmonary:     Effort: Respiratory distress present.     Comments: Coarse breath sounds Abdominal:     General: Abdomen is flat.     Palpations: Abdomen is soft.  Genitourinary:    Comments: B/L PCN sites unremarkable.  Right with more  OP in bag than left.  Urine is clear.  Dressed appropriately. Skin:    General: Skin is dry.     Comments: cool  Neurological:     General: No focal deficit present.     Mental Status: He is alert.    Imaging: CT Angio Chest PE W and/or Wo Contrast  Addendum Date: 02/18/2022   ADDENDUM REPORT: 02/18/2022 00:07 ADDENDUM: Results were discussed with Dr. Doren Custard at 12:04 a.m. [place] on February 18, 2022. Electronically Signed   By: Virgina Norfolk M.D.   On: 02/18/2022 00:07   Result Date: 02/18/2022 CLINICAL DATA:  New onset hypoxia. EXAM: CT ANGIOGRAPHY CHEST WITH CONTRAST TECHNIQUE: Multidetector CT imaging of the chest  was performed using the standard protocol during bolus administration of intravenous contrast. Multiplanar CT image reconstructions and MIPs were obtained to evaluate the vascular anatomy. RADIATION DOSE REDUCTION: This exam was performed according to the departmental dose-optimization program which includes automated exposure control, adjustment of the mA and/or kV according to patient size and/or use of iterative reconstruction technique. CONTRAST:  34mL OMNIPAQUE IOHEXOL 350 MG/ML SOLN COMPARISON:  January 09, 2022 FINDINGS: Cardiovascular: Satisfactory opacification of the pulmonary arteries to the segmental level. Extensive intraluminal filling defects are seen within the distal aspects of the bilateral pulmonary arteries and numerous bilateral upper lobe, right middle lobe and bilateral lower lobe branches. There is no evidence of saddle embolus. Normal heart size with right heart strain noted (RV/LV ratio of approximately 2.27). No pericardial effusion. Mediastinum/Nodes: No enlarged mediastinal, hilar, or axillary lymph nodes. Thyroid gland, trachea, and esophagus demonstrate no significant findings. Lungs/Pleura: Lungs are clear. No pleural effusion or pneumothorax. Upper Abdomen: There is a small hiatal hernia. Moderate severity diffuse calcification of the right adrenal gland is seen, with mild diffuse left adrenal gland enlargement. Musculoskeletal: Multilevel degenerative changes are seen throughout the thoracic spine. Numerous, ill-defined sclerotic areas are also seen throughout the thoracic spine and bilateral ribs. Review of the MIP images confirms the above findings. IMPRESSION: 1. Extensive bilateral pulmonary embolism with evidence of right heart strain (RV/LV ratio of approximately 2.27). 2. Findings consistent with diffuse osseous metastatic disease. 3. Small hiatal hernia. Electronically Signed: By: Virgina Norfolk M.D. On: 02/18/2022 00:01   IR Angiogram Pulmonary Bilateral  Selective  Result Date: 02/18/2022 INDICATION: Acute sub massive bilateral central pulmonary emboli with evidence of right heart strain by echo, troponin leak, and oxygen requirement. EXAM: ULTRASOUND GUIDANCE FOR VASCULAR ACCESS BILATERAL PULMONARY ARTERY CATHETERIZATIONS AND ANGIOGRAMS BILATERAL PULMONARY EMBOLI SUCTION THROMBECTOMY WITH THE PENUMBRA 16 FRENCH DEVICE COMPARISON:  02/17/2022 MEDICATIONS: 1% lidocaine local, 6000 units heparin, 250 mg normal saline bolus ANESTHESIA/SEDATION: Moderate (conscious) sedation was employed during this procedure. A total of Versed 2.5 mg and Fentanyl 75 mcg was administered intravenously by the radiology nurse. Total intra-service moderate Sedation Time: 101 minutes. The patient's level of consciousness and vital signs were monitored continuously by radiology nursing throughout the procedure under my direct supervision. FLUOROSCOPY: Radiation Exposure Index (as provided by the fluoroscopic device): 510 mGy Kerma COMPLICATIONS: None immediate. TECHNIQUE: Informed written consent was obtained from the patient after a thorough discussion of the procedural risks, benefits and alternatives. All questions were addressed. Maximal Sterile Barrier Technique was utilized including caps, mask, sterile gowns, sterile gloves, sterile drape, hand hygiene and skin antiseptic. A timeout was performed prior to the initiation of the procedure. Previous imaging reviewed. Under sterile conditions and local anesthesia, ultrasound micropuncture needle access performed of the patent right common femoral vein. Images  obtained for documentation of the patent right common femoral vein. Transitional dilator advanced followed by guidewire exchanged for a Amplatz guidewire. Transitional dilatation performed to insert the 16 [place] Gore dry seal sheath (33 cm length). This was position in the suprarenal IVC. Angled pulmonary artery catheter was utilized to navigate through the right heart initially  into the left pulmonary artery. Left pulmonary angiogram performed. Bulky central nearly occlusive branching filling defects present in the left pulmonary artery vasculature. Initial central pulmonary artery pressure measurement quickly obtained: 32/8 (16). Amplatze guidewire advanced into the left lower lobe pulmonary artery. The 47 French penumbra thrombectomy device was advanced easily over the Amplatz guidewire into the hilar pulmonary artery. Position confirmed with fluoroscopy. Images obtained for documentation. Suction thrombectomy performed yielding a moderate volume of fresh clot fragments. At one point, the penumbra 16 French catheter was removed while under suction for removal of additional clot. Pulmonary artery catheter had to be readvanced and navigated through the right heart again to access the right pulmonary artery vasculature. Right pulmonary angiogram performed. Similar central hilar branching filling defects present into the right lower lobe pulmonary arteries. This correlates with the CTA. Amplatz guidewire inserted. Penumbra device tracted easily over the Amplatz guidewire into the right hilar pulmonary vasculature. Suction thrombectomy performed with micro advancements and retractions to remove thrombus. At 1 point, the suction catheter was occluded with thrombus and had to be removed over a guidewire to completely remove additional clot. Following clot removal, repeat right central pulmonary angiogram demonstrates significant reduction in thrombus burden with improved right pulmonary artery flow. Repeat left pulmonary angiogram also performed by manipulating the penumbra catheter back to the left hilar branches. This confirms residual filling defect/thrombus. Additional suction thrombectomy performed to remove more acute clot from the central left pulmonary vasculature. Repeat injection of the left pulmonary arteries confirms improvement in the clot burden and vascular flow. At this point,  post thrombectomy pressure measurements obtained: 19/3 (7). Access removed. Hemostasis obtained with a pursestring suture at the right common femoral access site. ACT monitor during the procedure with a goal of 250 which required 6000 units of IV heparin during the case. Completion ACT sheath removal was 200. FINDINGS: Imaging confirms improvement in the overall bilateral pulmonary artery clot burden following suction thrombectomy. IMPRESSION: Successful bilateral acute pulmonary emboli suction thrombectomy with the 16 French penumbra device. Reduction in pulmonary artery pressure from 32/8 (16) to 19/3 (7) Electronically Signed   By: Jerilynn Mages.  Shick M.D.   On: 02/18/2022 13:21   IR Nephrostomy Tube Change  Result Date: 02/19/2022 INDICATION: 76 year old male referred for bilateral percutaneous nephrostomy exchange EXAM: BILATERAL PERCUTANEOUS NEPHROSTOMY EXCHANGE COMPARISON:  None. MEDICATIONS: None ANESTHESIA/SEDATION: None CONTRAST:  20 cc-administered into the collecting system(s) FLUOROSCOPY: Radiation Exposure Index (as provided by the fluoroscopic device): 1 minute, 3 mGy Kerma COMPLICATIONS: None PROCEDURE: Informed written consent was obtained from the patient after a thorough discussion of the procedural risks, benefits and alternatives. All questions were addressed. Maximal Sterile Barrier Technique was utilized including caps, mask, sterile gowns, sterile gloves, sterile drape, hand hygiene and skin antiseptic. A timeout was performed prior to the initiation of the procedure. The patient was positioned in the operation suite in the prone position on fluoroscopy table. The bilateral flank and the indwelling tubes were prepped and draped in the usual sterile fashion, and a sterile drape was placed. Scout image was obtained. The skin and subcutaneous tissues surrounding the indwelling tubes were infiltrated with 1% lidocaine for local anesthesia. Left: Contrast  was infused through the left indwelling drain  catheter, opacifying the collecting system, as well as the proximal ureter. The catheter was then ligated, and an 035 guidewire was advanced through the existing drain into the collecting system. The catheter was then removed. A new, 10 French percutaneous nephrostomy drain was advanced over the guidewire, forming the loop in the collecting system. The wire and inner dilator/stiffener were removed. Catheter was attached to gravity drainage. Retention suture was placed. Contrast was infused through the catheter confirming position. A final image was stored. Right: Imaging the right-sided percutaneous nephrostomy demonstrates that the tube has been nearly completely withdrawn from the body. The catheter was then ligated, and an 035 guidewire was advanced through the existing drain into the tissue tract. A Kumpe the catheter was navigated through the tissue tract and then the wire was removed. Contrast was injected opacifying a tract to the kidney. The Kumpe the catheter was then navigated with the small contrast into the collecting system. Bentson wire was passed through the Kumpe the catheter into the collecting system. A new, 10 French percutaneous nephrostomy drain was advanced over the guidewire, forming the loop in the collecting system. The wire and inner dilator/stiffener were removed. Catheter was attached to gravity drainage. Retention suture was placed. Contrast was infused through the catheter confirming position. A final image was stored. The patient tolerated the procedure well and remained hemodynamically stable throughout. No complications were encountered and no significant blood loss was encountered. IMPRESSION: Status post exchange of bilateral percutaneous nephrostomy. Signed, Dulcy Fanny. Dellia Nims, RPVI [facility] Electronically Signed   By: Corrie Mckusick D.O.   On: 02/19/2022 16:19   IR Nephrostomy Tube Change  Result Date:  02/19/2022 INDICATION: 76 year old male referred for bilateral percutaneous nephrostomy exchange EXAM: BILATERAL PERCUTANEOUS NEPHROSTOMY EXCHANGE COMPARISON:  None. MEDICATIONS: None ANESTHESIA/SEDATION: None CONTRAST:  20 cc-administered into the collecting system(s) FLUOROSCOPY: Radiation Exposure Index (as provided by the fluoroscopic device): 1 minute, 3 mGy Kerma COMPLICATIONS: None PROCEDURE: Informed written consent was obtained from the patient after a thorough discussion of the procedural risks, benefits and alternatives. All questions were addressed. Maximal Sterile Barrier Technique was utilized including caps, mask, sterile gowns, sterile gloves, sterile drape, hand hygiene and skin antiseptic. A timeout was performed prior to the initiation of the procedure. The patient was positioned in the operation suite in the prone position on fluoroscopy table. The bilateral flank and the indwelling tubes were prepped and draped in the usual sterile fashion, and a sterile drape was placed. Scout image was obtained. The skin and subcutaneous tissues surrounding the indwelling tubes were infiltrated with 1% lidocaine for local anesthesia. Left: Contrast was infused through the left indwelling drain catheter, opacifying the collecting system, as well as the proximal ureter. The catheter was then ligated, and an 035 guidewire was advanced through the existing drain into the collecting system. The catheter was then removed. A new, 10 French percutaneous nephrostomy drain was advanced over the guidewire, forming the loop in the collecting system. The wire and inner dilator/stiffener were removed. Catheter was attached to gravity drainage. Retention suture was placed. Contrast was infused through the catheter confirming position. A final image was stored. Right: Imaging the right-sided percutaneous nephrostomy demonstrates that the tube has been nearly completely withdrawn from the body. The catheter was then ligated,  and an 035 guidewire was advanced through the existing drain into the tissue tract. A Kumpe the catheter was navigated through the tissue tract and  then the wire was removed. Contrast was injected opacifying a tract to the kidney. The Kumpe the catheter was then navigated with the small contrast into the collecting system. Bentson wire was passed through the Kumpe the catheter into the collecting system. A new, 10 French percutaneous nephrostomy drain was advanced over the guidewire, forming the loop in the collecting system. The wire and inner dilator/stiffener were removed. Catheter was attached to gravity drainage. Retention suture was placed. Contrast was infused through the catheter confirming position. A final image was stored. The patient tolerated the procedure well and remained hemodynamically stable throughout. No complications were encountered and no significant blood loss was encountered. IMPRESSION: Status post exchange of bilateral percutaneous nephrostomy. Signed, Dulcy Fanny. Dellia Nims, RPVI [facility] Electronically Signed   By: Corrie Mckusick D.O.   On: 02/19/2022 16:19   IR THROMBECT PRIM MECH INIT (INCLU) MOD SED  Result Date: 02/18/2022 INDICATION: Acute sub massive bilateral central pulmonary emboli with evidence of right heart strain by echo, troponin leak, and oxygen requirement. EXAM: ULTRASOUND GUIDANCE FOR VASCULAR ACCESS BILATERAL PULMONARY ARTERY CATHETERIZATIONS AND ANGIOGRAMS BILATERAL PULMONARY EMBOLI SUCTION THROMBECTOMY WITH THE PENUMBRA 16 FRENCH DEVICE COMPARISON:  02/17/2022 MEDICATIONS: 1% lidocaine local, 6000 units heparin, 250 mg normal saline bolus ANESTHESIA/SEDATION: Moderate (conscious) sedation was employed during this procedure. A total of Versed 2.5 mg and Fentanyl 75 mcg was administered intravenously by the radiology nurse. Total intra-service moderate Sedation Time: 101 minutes. The patient's level of  consciousness and vital signs were monitored continuously by radiology nursing throughout the procedure under my direct supervision. FLUOROSCOPY: Radiation Exposure Index (as provided by the fluoroscopic device): 578 mGy Kerma COMPLICATIONS: None immediate. TECHNIQUE: Informed written consent was obtained from the patient after a thorough discussion of the procedural risks, benefits and alternatives. All questions were addressed. Maximal Sterile Barrier Technique was utilized including caps, mask, sterile gowns, sterile gloves, sterile drape, hand hygiene and skin antiseptic. A timeout was performed prior to the initiation of the procedure. Previous imaging reviewed. Under sterile conditions and local anesthesia, ultrasound micropuncture needle access performed of the patent right common femoral vein. Images obtained for documentation of the patent right common femoral vein. Transitional dilator advanced followed by guidewire exchanged for a Amplatz guidewire. Transitional dilatation performed to insert the 16 [place] Gore dry seal sheath (33 cm length). This was position in the suprarenal IVC. Angled pulmonary artery catheter was utilized to navigate through the right heart initially into the left pulmonary artery. Left pulmonary angiogram performed. Bulky central nearly occlusive branching filling defects present in the left pulmonary artery vasculature. Initial central pulmonary artery pressure measurement quickly obtained: 32/8 (16). Amplatze guidewire advanced into the left lower lobe pulmonary artery. The 65 French penumbra thrombectomy device was advanced easily over the Amplatz guidewire into the hilar pulmonary artery. Position confirmed with fluoroscopy. Images obtained for documentation. Suction thrombectomy performed yielding a moderate volume of fresh clot fragments. At one point, the penumbra 16 French catheter was removed while under suction for removal of additional clot. Pulmonary artery catheter had  to be readvanced and navigated through the right heart again to access the right pulmonary artery vasculature. Right pulmonary angiogram performed. Similar central hilar branching filling defects present into the right lower lobe pulmonary arteries. This correlates with the CTA. Amplatz guidewire inserted. Penumbra device tracted easily over the Amplatz guidewire into the right hilar pulmonary vasculature. Suction thrombectomy performed with micro advancements and retractions to remove thrombus. At 1 point, the  suction catheter was occluded with thrombus and had to be removed over a guidewire to completely remove additional clot. Following clot removal, repeat right central pulmonary angiogram demonstrates significant reduction in thrombus burden with improved right pulmonary artery flow. Repeat left pulmonary angiogram also performed by manipulating the penumbra catheter back to the left hilar branches. This confirms residual filling defect/thrombus. Additional suction thrombectomy performed to remove more acute clot from the central left pulmonary vasculature. Repeat injection of the left pulmonary arteries confirms improvement in the clot burden and vascular flow. At this point, post thrombectomy pressure measurements obtained: 19/3 (7). Access removed. Hemostasis obtained with a pursestring suture at the right common femoral access site. ACT monitor during the procedure with a goal of 250 which required 6000 units of IV heparin during the case. Completion ACT sheath removal was 200. FINDINGS: Imaging confirms improvement in the overall bilateral pulmonary artery clot burden following suction thrombectomy. IMPRESSION: Successful bilateral acute pulmonary emboli suction thrombectomy with the 16 French penumbra device. Reduction in pulmonary artery pressure from 32/8 (16) to 19/3 (7) Electronically Signed   By: Jerilynn Mages.  Shick M.D.   On: 02/18/2022 13:21   IR US Guide Vasc Access Right  Result Date:  02/18/2022 INDICATION: Acute sub massive bilateral central pulmonary emboli with evidence of right heart strain by echo, troponin leak, and oxygen requirement. EXAM: ULTRASOUND GUIDANCE FOR VASCULAR ACCESS BILATERAL PULMONARY ARTERY CATHETERIZATIONS AND ANGIOGRAMS BILATERAL PULMONARY EMBOLI SUCTION THROMBECTOMY WITH THE PENUMBRA 16 FRENCH DEVICE COMPARISON:  02/17/2022 MEDICATIONS: 1% lidocaine local, 6000 units heparin, 250 mg normal saline bolus ANESTHESIA/SEDATION: Moderate (conscious) sedation was employed during this procedure. A total of Versed 2.5 mg and Fentanyl 75 mcg was administered intravenously by the radiology nurse. Total intra-service moderate Sedation Time: 101 minutes. The patient's level of consciousness and vital signs were monitored continuously by radiology nursing throughout the procedure under my direct supervision. FLUOROSCOPY: Radiation Exposure Index (as provided by the fluoroscopic device): 147 mGy Kerma COMPLICATIONS: None immediate. TECHNIQUE: Informed written consent was obtained from the patient after a thorough discussion of the procedural risks, benefits and alternatives. All questions were addressed. Maximal Sterile Barrier Technique was utilized including caps, mask, sterile gowns, sterile gloves, sterile drape, hand hygiene and skin antiseptic. A timeout was performed prior to the initiation of the procedure. Previous imaging reviewed. Under sterile conditions and local anesthesia, ultrasound micropuncture needle access performed of the patent right common femoral vein. Images obtained for documentation of the patent right common femoral vein. Transitional dilator advanced followed by guidewire exchanged for a Amplatz guidewire. Transitional dilatation performed to insert the 16 [place] Gore dry seal sheath (33 cm length). This was position in the suprarenal IVC. Angled pulmonary artery catheter was utilized to navigate through the right heart initially into the left pulmonary  artery. Left pulmonary angiogram performed. Bulky central nearly occlusive branching filling defects present in the left pulmonary artery vasculature. Initial central pulmonary artery pressure measurement quickly obtained: 32/8 (16). Amplatze guidewire advanced into the left lower lobe pulmonary artery. The 19 French penumbra thrombectomy device was advanced easily over the Amplatz guidewire into the hilar pulmonary artery. Position confirmed with fluoroscopy. Images obtained for documentation. Suction thrombectomy performed yielding a moderate volume of fresh clot fragments. At one point, the penumbra 16 French catheter was removed while under suction for removal of additional clot. Pulmonary artery catheter had to be readvanced and navigated through the right heart again to access the right pulmonary artery vasculature. Right pulmonary angiogram performed. Similar central hilar branching  filling defects present into the right lower lobe pulmonary arteries. This correlates with the CTA. Amplatz guidewire inserted. Penumbra device tracted easily over the Amplatz guidewire into the right hilar pulmonary vasculature. Suction thrombectomy performed with micro advancements and retractions to remove thrombus. At 1 point, the suction catheter was occluded with thrombus and had to be removed over a guidewire to completely remove additional clot. Following clot removal, repeat right central pulmonary angiogram demonstrates significant reduction in thrombus burden with improved right pulmonary artery flow. Repeat left pulmonary angiogram also performed by manipulating the penumbra catheter back to the left hilar branches. This confirms residual filling defect/thrombus. Additional suction thrombectomy performed to remove more acute clot from the central left pulmonary vasculature. Repeat injection of the left pulmonary arteries confirms improvement in the clot burden and vascular flow. At this point, post thrombectomy  pressure measurements obtained: 19/3 (7). Access removed. Hemostasis obtained with a pursestring suture at the right common femoral access site. ACT monitor during the procedure with a goal of 250 which required 6000 units of IV heparin during the case. Completion ACT sheath removal was 200. FINDINGS: Imaging confirms improvement in the overall bilateral pulmonary artery clot burden following suction thrombectomy. IMPRESSION: Successful bilateral acute pulmonary emboli suction thrombectomy with the 16 French penumbra device. Reduction in pulmonary artery pressure from 32/8 (16) to 19/3 (7) Electronically Signed   By: Jerilynn Mages.  Shick M.D.   On: 02/18/2022 13:21   DG CHEST PORT 1 VIEW  Result Date: 02/20/2022 CLINICAL DATA:  Hypoxemia EXAM: PORTABLE CHEST 1 VIEW COMPARISON:  02/17/2022 FINDINGS: Cardiomegaly. Accentuating low lung volumes. Streaky density at the bases favoring atelectasis. No edema, effusion, or pneumothorax. Unremarkable porta catheter positioning IMPRESSION: Cardiomegaly. Mild atelectasis. Electronically Signed   By: Jorje Guild M.D.   On: 02/20/2022 06:35   DG Chest Port 1 View  Result Date: 02/17/2022 CLINICAL DATA:  Concern for sepsis EXAM: PORTABLE CHEST 1 VIEW COMPARISON:  01/09/2022 FINDINGS: Cardiac and mediastinal contours are within normal limits. Redemonstrated right chest port with catheter tip in the low SVC. No focal pulmonary opacity. No pleural effusion or pneumothorax. No acute osseous abnormality. Degenerative changes in the bilateral shoulders. IMPRESSION: No acute cardiopulmonary process. Electronically Signed   By: Merilyn Baba M.D.   On: 02/17/2022 22:03   ECHOCARDIOGRAM COMPLETE  Result Date: 02/18/2022    ECHOCARDIOGRAM REPORT   Patient Name:   Jose Wolfe [facility] Date of Exam: 02/18/2022 Medical Rec #:  [id number]               Height:       72.0 in Accession #:    [id number]              Weight:       155.2 lb Date of Birth:  10/28/1946               BSA:           1.913 m Patient Age:    76 years                BP:           119/66 mmHg Patient Gender: M                       HR:           90 bpm. Exam Location:  Inpatient Procedure: 2D Echo, Cardiac Doppler, Color Doppler and Strain Analysis  STAT ECHO  Dr Tamala Julian and [facility] both have seen echo. Indications:    Pulmonary embolus  History:        Patient has prior history of Echocardiogram examinations, most                 recent 10/09/2021. COPD; Signs/Symptoms:Chest Pain.  Sonographer:    Luisa Hart RDCS Referring Phys: [id number] RAHUL P DESAI IMPRESSIONS  1. Left ventricular ejection fraction, by estimation, is 60 to 65%. The left ventricle has normal function. The left ventricle has no regional wall motion abnormalities. Left ventricular diastolic parameters are consistent with Grade I diastolic dysfunction (impaired relaxation). There is the interventricular septum is flattened in systole and diastole, consistent with right ventricular pressure and volume overload.  2. Right ventricular systolic function is severely reduced. The right ventricular size is moderately enlarged. There is mildly elevated pulmonary artery systolic pressure. The estimated right ventricular systolic pressure is 92.4 mmHg.  3. Right atrial size was moderately dilated.  4. The mitral valve is normal in structure. No evidence of mitral valve regurgitation. No evidence of mitral stenosis.  5. Tricuspid valve regurgitation is moderate.  6. The aortic valve is normal in structure. There is mild calcification of the aortic valve. There is mild thickening of the aortic valve. Aortic valve regurgitation is trivial. Aortic valve sclerosis is present, with no evidence of aortic valve stenosis. Aortic valve mean gradient measures 2.0 mmHg. Aortic valve Vmax measures 1.16 m/s.  7. The inferior vena cava is dilated in size with >50% respiratory variability, suggesting right atrial pressure of 8 mmHg. Comparison(s): Prior images reviewed side by  side. RV function and size has worsened when compared to prior ECHO. FINDINGS  Left Ventricle: Left ventricular ejection fraction, by estimation, is 60 to 65%. The left ventricle has normal function. The left ventricle has no regional wall motion abnormalities. Global longitudinal strain performed but not reported based on interpreter judgement due to suboptimal tracking. The left ventricular internal cavity size was normal in size. There is no left ventricular hypertrophy. The interventricular septum is flattened in systole and diastole, consistent with right ventricular pressure and volume overload. Left ventricular diastolic parameters are consistent with Grade I diastolic dysfunction (impaired relaxation). Right Ventricle: The right ventricular size is moderately enlarged. No increase in right ventricular wall thickness. Right ventricular systolic function is severely reduced. There is mildly elevated pulmonary artery systolic pressure. The tricuspid regurgitant velocity is 2.81 m/s, and with an assumed right atrial pressure of 8 mmHg, the estimated right ventricular systolic pressure is 26.8 mmHg. Left Atrium: Left atrial size was normal in size. Right Atrium: Right atrial size was moderately dilated. Pericardium: There is no evidence of pericardial effusion. Mitral Valve: The mitral valve is normal in structure. No evidence of mitral valve regurgitation. No evidence of mitral valve stenosis. Tricuspid Valve: The tricuspid valve is normal in structure. Tricuspid valve regurgitation is moderate . No evidence of tricuspid stenosis. Aortic Valve: The aortic valve is normal in structure. There is mild calcification of the aortic valve. There is mild thickening of the aortic valve. Aortic valve regurgitation is trivial. Aortic valve sclerosis is present, with no evidence of aortic valve stenosis. Aortic valve mean gradient measures 2.0 mmHg. Aortic valve peak gradient measures 5.4 mmHg. Aortic valve area, by VTI  measures 3.23 cm. Pulmonic Valve: The pulmonic valve was normal in structure. Pulmonic valve regurgitation is not visualized. No evidence of pulmonic stenosis. Aorta: The aortic root is normal in size and structure.  Venous: The inferior vena cava is dilated in size with greater than 50% respiratory variability, suggesting right atrial pressure of 8 mmHg. IAS/Shunts: No atrial level shunt detected by color flow Doppler.  LEFT VENTRICLE PLAX 2D LVIDd:         4.20 cm     Diastology LVIDs:         2.60 cm     LV e' medial:    5.66 cm/s LV PW:         0.80 cm     LV E/e' medial:  7.9 LV IVS:        1.10 cm     LV e' lateral:   7.83 cm/s LVOT diam:     2.30 cm     LV E/e' lateral: 5.7 LV SV:         49 LV SV Index:   26 LVOT Area:     4.15 cm  LV Volumes (MOD) LV vol d, MOD A2C: 68.8 ml LV vol d, MOD A4C: 52.2 ml LV vol s, MOD A2C: 19.0 ml LV vol s, MOD A4C: 29.0 ml LV SV MOD A2C:     49.8 ml LV SV MOD A4C:     52.2 ml LV SV MOD BP:      35.9 ml RIGHT VENTRICLE RV Basal diam:  4.80 cm RV Mid diam:    4.80 cm RV S prime:     11.00 cm/s TAPSE (M-mode): 1.6 cm LEFT ATRIUM             Index        RIGHT ATRIUM           Index LA diam:        2.60 cm 1.36 cm/m   RA Area:     23.60 cm LA Vol (A2C):   18.3 ml 9.57 ml/m   RA Volume:   79.00 ml  41.30 ml/m LA Vol (A4C):   20.3 ml 10.61 ml/m LA Biplane Vol: 20.5 ml 10.72 ml/m  AORTIC VALVE                    PULMONIC VALVE AV Area (Vmax):    3.55 cm     PV Vmax:          1.06 m/s AV Area (Vmean):   2.98 cm     PV Vmean:         65.300 cm/s AV Area (VTI):     3.23 cm     PV VTI:           0.136 m AV Vmax:           116.00 cm/s  PV Peak grad:     4.5 mmHg AV Vmean:          71.200 cm/s  PV Mean grad:     2.0 mmHg AV VTI:            0.152 m      PR End Diast Vel: 3.21 msec AV Peak Grad:      5.4 mmHg AV Mean Grad:      2.0 mmHg LVOT Vmax:         99.20 cm/s LVOT Vmean:        51.100 cm/s LVOT VTI:          0.118 m LVOT/AV VTI ratio: 0.78  AORTA Ao Asc diam: 3.50 cm MITRAL  VALVE  TRICUSPID VALVE MV Area (PHT): 3.79 cm    TR Peak grad:   31.6 mmHg MV Decel Time: 200 msec    TR Vmax:        281.00 cm/s MV E velocity: 44.60 cm/s                            SHUNTS                            Systemic VTI:  0.12 m                            Systemic Diam: 2.30 cm Candee Furbish MD Electronically signed by Candee Furbish MD Signature Date/Time: 02/18/2022/8:36:35 AM    Final    VAS [place] LOWER EXTREMITY VENOUS (DVT)  Result Date: 02/18/2022  Lower Venous DVT Study Patient Name:  TEMITAYO COVALT [facility]  Date of Exam:   02/18/2022 Medical Rec #: [id number]                Accession #:    [id number] Date of Birth: May 14, 1946                Patient Gender: M Patient Age:   19 years Exam Location:  [facility] Procedure:      VAS [place] LOWER EXTREMITY VENOUS (DVT) Referring Phys: Montey Hora --------------------------------------------------------------------------------  Indications: Pulmonary embolism.  Risk Factors: Confirmed PE DVT. Anticoagulation: Heparin. Limitations: Poor ultrasound/tissue interface and bandages. Comparison Study: 02/12/2022 - RIGHT:                   - No evidence of deep vein thrombosis in the lower extremity.                   No indirect                   evidence of obstruction proximal to the inguinal ligament.                    LEFT:                   - There is no evidence of deep vein thrombosis in the lower                   extremity.                   - There is no evidence of superficial venous thrombosis.                    - No cystic structure found in the popliteal fossa.                   - Limited imaging suggests extrinsic compression/ obstruction                   of left                   common iliac vein.                   - Possible obstruction proximal to the inguinal ligament. Performing Technologist: Oliver Hum RVT  Examination Guidelines: A complete evaluation includes B-mode imaging, spectral Doppler, color Doppler, and power  Doppler as needed of all accessible portions of each  vessel. Bilateral testing is considered an integral part of a complete examination. Limited examinations for reoccurring indications may be performed as noted. The reflux portion of the exam is performed with the patient in reverse Trendelenburg.  +---------+---------------+---------+-----------+----------+-------------------+  RIGHT     Compressibility Phasicity Spontaneity Properties Thrombus Aging       +---------+---------------+---------+-----------+----------+-------------------+  CFV                                                        Not well visualized  +---------+---------------+---------+-----------+----------+-------------------+  SFJ                                                        Not well visualized  +---------+---------------+---------+-----------+----------+-------------------+  FV Prox   Full            Yes       Yes                                         +---------+---------------+---------+-----------+----------+-------------------+  FV Mid    Full                                                                  +---------+---------------+---------+-----------+----------+-------------------+  FV Distal Full                                                                  +---------+---------------+---------+-----------+----------+-------------------+  PFV       Full            Yes       Yes                                         +---------+---------------+---------+-----------+----------+-------------------+  POP       Full            Yes       Yes                                         +---------+---------------+---------+-----------+----------+-------------------+  PTV       Full                                                                  +---------+---------------+---------+-----------+----------+-------------------+  PERO      Full                                                                   +---------+---------------+---------+-----------+----------+-------------------+   +---------+---------------+---------+-----------+----------+-------------------+  LEFT      Compressibility Phasicity Spontaneity Properties Thrombus Aging       +---------+---------------+---------+-----------+----------+-------------------+  CFV       Partial         No        No                     Acute                +---------+---------------+---------+-----------+----------+-------------------+  SFJ       Full                                                                  +---------+---------------+---------+-----------+----------+-------------------+  FV Prox   Partial         No        No                     Acute                +---------+---------------+---------+-----------+----------+-------------------+  FV Mid    Partial         No        No                     Acute                +---------+---------------+---------+-----------+----------+-------------------+  FV Distal Partial         No        No                     Acute                +---------+---------------+---------+-----------+----------+-------------------+  PFV       Partial         No        No                     Acute                +---------+---------------+---------+-----------+----------+-------------------+  POP       Full            No        No                                          +---------+---------------+---------+-----------+----------+-------------------+  PTV       Full                                                                  +---------+---------------+---------+-----------+----------+-------------------+  PERO                                                       Not well visualized  +---------+---------------+---------+-----------+----------+-------------------+  EIV       Partial         No        No                     Acute                +---------+---------------+---------+-----------+----------+-------------------+   CIV                       Yes       Yes                                         +---------+---------------+---------+-----------+----------+-------------------+ The CIV, and distal IVC appears patent.    Summary: RIGHT: - There is no evidence of deep vein thrombosis in the lower extremity. However, portions of this examination were limited- see technologist comments above.  - No cystic structure found in the popliteal fossa.  LEFT: - Findings consistent with acute deep vein thrombosis involving the left external ilic vein, left common femoral vein, left femoral vein, and left proximal profunda vein. - No cystic structure found in the popliteal fossa.  *See table(s) above for measurements and observations. Electronically signed by Harold Barban MD on 02/18/2022 at 10:32:15 PM.    Final     Labs:  CBC: Recent Labs    02/17/22 2140 02/18/22 0214 02/19/22 0109 02/20/22 0235 02/20/22 0432  WBC 16.9* 15.0* 13.7* 12.8*  --   HGB 10.1* 9.5* 9.0* 8.2* 10.5*  HCT 31.4* 29.2* 28.2* 24.5* 31.0*  PLT 336 289 210 217  --     COAGS: Recent Labs    01/10/22 0304 02/17/22 2140  INR 1.1 1.2    BMP: Recent Labs    02/17/22 2140 02/18/22 0214 02/19/22 0109 02/20/22 0235 02/20/22 0432  NA 132* 130* 131* 128* 127*  K 4.8 4.9 4.1 4.1 4.2  CL 98 99 100 98  --   CO2 19* 20* 18* 18*  --   GLUCOSE 185* 144* 122* 132*  --   BUN 13 14 15  25*  --   CALCIUM 7.8* 7.4* 6.7* 6.8*  --   CREATININE 1.06 0.97 1.09 1.26*  --   GFRNONAA >60 >60 >60 59*  --     LIVER FUNCTION TESTS: Recent Labs    01/28/22 0905 02/04/22 1125 02/17/22 2140 02/20/22 0235  BILITOT 0.3 0.3 0.7 0.3  AST 15 22 49* 613*  ALT 13 15 27  429*  ALKPHOS 91 111 79 75  PROT 6.4* 6.1* 6.1* 5.2*  ALBUMIN 3.2* 3.3* 2.6* 2.1*    Assessment and Plan:  Bilateral PE and DVT ---2 days s/p B/L PE thrombectomy, now with increased SOB and RR, mild hypotension, slightly tachycardic, increased O2 need ---Discussed case with CCM  Attending Dr. Silas Flood and IR Attending Dr. Kathlene Cote, considering imaging such as CTA and VQ perfusion scan.  In the setting of being s/p PE event, a VQ scan will not be able to differentiate old vs new perfusion defects  and is not recommended.  A CTA could be performed if can be renally tolerated.  Systemic TPA may be a consideration.  Additionally, an IVC filter may be considered as this can be placed using CO2 as contrast rather than an iodinated based agent.   ---IR available for additional consultation as needed and will continue to follow along with this patient.      Electronically Signed: Pasty Spillers, PA 02/20/2022, 10:29 AM   I spent a total of  45 minutes  at the the patient's bedside AND on the patient's hospital floor or unit, greater than 50% of which was counseling/coordinating care for bacteremia, PCNs, PEs, and DVT

## 2022-02-20 NOTE — Progress Notes (Signed)
°  Echocardiogram 2D Echocardiogram has been performed.  Jose Wolfe 02/20/2022, 10:36 AM

## 2022-02-20 NOTE — Progress Notes (Addendum)
NAME:  Jose Wolfe, MRN:  [id number], DOB:  Apr 17, 1946, LOS: 2 ADMISSION DATE:  02/17/2022, CONSULTATION DATE:  02/18/22 REFERRING MD:  Doren Custard, CHIEF COMPLAINT:  Dyspnea, weakness   History of Present Illness:  Jose Wolfe is a 76 y.o. male who has a PMH of metastatic advanced prostate CA with lymphadenopathy initially diagnosed in 2020 and currently undergoing chemotherapy.  He presented to [facility] ED 2/21 with generalized weakness and dyspnea.     In ED, he had CTA that demonstrated extensive bilateral PE with RV/LV of 2.27.  Initial troponin 722 and lactate 4.4.  He was placed on 5L O2 via [place] and O2 remained 99%.     UA in ED also suggestive of UTI.  Of note, he has bilateral nephrostomy tubes placed 2/16 after he had bilateral obstructive uropathy  Pertinent  Medical History  Metastatic prostate cancer Port-A-cath inplace COPD AKI due to bilateral obstructive uropathy s/p bilateral nephrostomy tubes Submassive pulmonary embolism DVT, left lower extremity  Significant Hospital Events: Including procedures, antibiotic start and stop dates in addition to other pertinent events   2/22 admit due to submassice PE w Rt hear strain on CTA. cdTPA with IR 2/23 4/4 bottles MRSE+ blood culture, bilateral nephrostomy tube exchanged, in afib with RVR  Interim History / Subjective:  O/N: felt sob, supplemental O2 increased to 6L, elevated RR, HR in 120s with afib.   Patient assessed at bedside this AM. He states that he continues to feel short of breath and anxious. He does not have pain at nephrostomy sites.   Objective   Blood pressure (!) 73/61, pulse (!) 107, temperature 97.7 F (36.5 C), temperature source Oral, resp. rate (!) 29, height 6' (1.829 m), weight 70.4 kg, SpO2 91 %.    FiO2 (%):  [28 %] 28 %   Intake/Output Summary (Last 24 hours) at 02/20/2022 0548 Last data filed at 02/20/2022 0400 Gross per 24 hour  Intake 981.54 ml  Output 626 ml  Net 355.54 ml    Filed Weights   02/17/22 2112 02/19/22 0500  Weight: 70.4 kg 70.4 kg   Tmax 100.4 Examination: General: sitting up in bed with blanket wrapped around shoulders, appears anxious, [place] in place HENT: NCAT Lungs: CTAB, O2 saturations in low 90s on 6L [place], normal work of breathing Cardiovascular: Irregularly irregular, afib Abdomen: non-distended, non-tender, soft Extremities: 3+ non pitting edema in left lower extremity Neuro: alert and oriented x4 GU: bilateral nephrostomy tubes in place, yellow urine in bags  Na 131-> 128 Bicarb 18 BUN 15-> 25 Mg 2.7 Creatinine 1.09-> 1.26, GFR 59 AST 613, ALT 429 WBC 13.7-> 12.8 Hgb 9.0-> 8.2 Net +2L Glucose 120-130s Heparin subtherapeutic MRSE in 4/4 bottles blood culture CXR mild atelectasis  Urine culture pending Repeat blood culture pending  Resolved Hospital Problem list     Assessment & Plan:  Sepsis 2/2 MRSE Bacteriemia Urosepsis A: Tmax 100.4. WBC downtrending. Bilateral nephrostomy tubes exchanged 2/23. Blood culture with 4/4 MRSE 2/23. MAP in low 60s. Urine culture sent 2/23. P: -continue Vancomycin and Ceftriaxone -f/u blood culture and urine culture -Neo with MAP goal greater than 65 -ID consult, patient currently receiving chemo, port in place  Acute bilateral Submassive Pulmonary Embolism s/p thrombectomy 2/22 Acute DVT of left external iliac vein, left common femoral vein, and left proximal profunda vein. A: repeat CXR this AM showed mild atelectasis.  P: - Patient living with metastatic prostate cancer - Continue heparin for now, consider transition to [place] when able -  PT/OT following - repeat echo - if respiratory status worsens, consider IV lasix  A fib w RVR Afib overnight, rates in 120s -amio gtt  Elevated LFTs, amiodarone vs sepsis vs congestive hepatopathy AST 613, ALT 429. He was started on amiodarone 2/23.  -trend CMP this PM -repeat echo  Constipation Patient had 2 small bowel movements  yesterday. -dulcolax suppository daily -colace BID PRN -Miralax daily  NAGMA Bicarb 18, no diarrhea. Will check Urine anion gap. -urine Na, K, Cl  Hyponatremia Na at 128 this AM. -Urine Osm -AM cortisol 2/25 -recheck CMP this PM  COPD, no exacerbation - [place], pulmicort - start yupelri - Levalbuterol prn  Protein calorie malnutrition -boost  Best Practice (right click and "Reselect all SmartList Selections" daily)   Diet/type: Regular consistency (see orders) DVT prophylaxis: systemic heparin GI prophylaxis: N/A Lines: N/A Foley:  N/A Code Status:  DNR Last date of multidisciplinary goals of care discussion [pending]

## 2022-02-20 NOTE — Progress Notes (Signed)
ANTICOAGULATION CONSULT NOTE - Follow Up Consult  Pharmacy Consult for Heparin Indication: pulmonary embolus  Allergies  Allergen Reactions   Atorvastatin     Other reaction(s): Myalgias    Patient Measurements: Height: 6' (182.9 cm) Weight: 71 kg (156 lb 8.4 oz) IBW/kg (Calculated) : 77.6 Heparin Dosing Weight: 70.4 kg  Vital Signs: Temp: 97.5 F (36.4 C) (02/24 0735) Temp Source: Axillary (02/24 0735) BP: 88/59 (02/24 0900) Pulse Rate: 99 (02/24 0900)  Labs: Recent Labs    02/17/22 2140 02/17/22 2334 02/18/22 0214 02/18/22 1001 02/19/22 0109 02/19/22 0619 02/20/22 0235 02/20/22 0432 02/20/22 1207  HGB 10.1*  --  9.5*  --  9.0*  --  8.2* 10.5*  --   HCT 31.4*  --  29.2*  --  28.2*  --  24.5* 31.0*  --   PLT 336  --  289  --  210  --  217  --   --   LABPROT 14.8  --   --   --   --   --   --   --   --   INR 1.2  --   --   --   --   --   --   --   --   HEPARINUNFRC  --   --   --    < >  --  0.31 0.18*  --  0.12*  CREATININE 1.06  --  0.97  --  1.09  --  1.26*  --   --   TROPONINIHS 636* 722* 721*  --   --   --   --   --   --    < > = values in this interval not displayed.     Estimated Creatinine Clearance: 49.3 mL/min (A) (by C-G formula based on SCr of 1.26 mg/dL (H)).   Assessment: Jose Wolfe is a 76 year old male with bilateral PE with RHS. He is s/p thrombectomy on 2/22. He received a total of 6,000 units bolus in IR on 2/22.  Heparin level at 0.12 this am. No issues during infusion and no signs/symptoms of bleeding. Will increase infusion.  Goal of Therapy:  Heparin level 0.3-0.7 units/ml Monitor platelets by anticoagulation protocol: Yes   Plan:  Give a bolus of 2000 units and Increase heparin to 1550 units/h Recheck heparin level in 8 hours Monitor CBC and signs/symptoms of bleeding Check heparin level daily  Alanda Slim, PharmD, [facility] Clinical Pharmacist Please see AMION for all Pharmacists' Contact Phone Numbers 02/20/2022, 1:07 PM

## 2022-02-20 NOTE — Progress Notes (Addendum)
OT Cancellation Note  Patient Details Name: Jose Wolfe MRN: [id number] DOB: Mar 30, 1946   Cancelled Treatment:    Reason Eval/Treat Not Completed: Patient declined, no reason specified Pt politely declined OT eval at this time. Pt requested OT come back in 15-20 min when visitors leave bedside. After approximately 30 min, visitors still at bedside. OT unable to check back. Will follow-up, likely tomorrow 2/25  Layla Maw 02/20/2022, 1:20 PM

## 2022-02-20 NOTE — NC FL2 (Signed)
[place] MEDICAID FL2 LEVEL OF CARE SCREENING TOOL     IDENTIFICATION  Patient Name: Jose Wolfe Birthdate: 05-24-1946 Sex: male Admission Date (Current Location): 02/17/2022  [facility] and [place] Number:  Herbalist and Address:  [facility], [place] [address], [place], [place] [address]      Provider Number: [id number]  Attending Physician Name and Address:  Lanier Clam, MD  Relative Name and Phone Number:  Zaviyar, Rahal [phone]  [phone]    Current Level of Care: Hospital Recommended Level of Care: [place] Prior Approval Number:    Date Approved/Denied:   PASRR Number: [id number]  Discharge Plan: SNF    Current Diagnoses: Patient Active Problem List   Diagnosis Date Noted   Protein-calorie malnutrition, severe 02/20/2022   Pulmonary embolism ([place]) 02/18/2022   Sepsis ([place])    DNR (do not resuscitate)    Leukopenia due to antineoplastic chemotherapy ([place]) 01/14/2022   Large bowel obstruction ([place]) 01/09/2022   COPD (chronic obstructive pulmonary disease) ([place])    Obstructive uropathy    Bilateral hydronephrosis    AKI (acute kidney injury) ([place]) due to bilateral obstructive uropathy    Hyperkalemia    Atypical chest pain    Normocytic anemia    Port-A-Cath in place 10/17/2021   Metastatic castration-resistant adenocarcinoma of prostate ([place]) 10/18/2019    Orientation RESPIRATION BLADDER Height & Weight     Self, Time, Situation, Place  O2 Continent Weight: 156 lb 8.4 oz (71 kg) Height:  6' (182.9 cm)  BEHAVIORAL SYMPTOMS/MOOD NEUROLOGICAL BOWEL NUTRITION STATUS      Continent Diet (see discharge summary)  AMBULATORY STATUS COMMUNICATION OF NEEDS Skin   Supervision Verbally Normal                       Personal Care Assistance Level of Assistance  Bathing, Feeding, Dressing Bathing Assistance: Maximum assistance Feeding assistance: Limited assistance Dressing  Assistance: Maximum assistance     Functional Limitations Info  Sight, Hearing, Speech Sight Info: Adequate Hearing Info: Adequate Speech Info: Adequate    SPECIAL CARE FACTORS FREQUENCY  PT (By licensed PT), OT (By licensed OT)     PT Frequency: 5x week OT Frequency: 5x week            Contractures Contractures Info: Not present    Additional Factors Info  Code Status, Allergies Code Status Info: DNR Allergies Info: atorvastatin           Current Medications (02/20/2022):  This is the current hospital active medication list Current Facility-Administered Medications  Medication Dose Route Frequency Provider Last Rate Last Admin   0.9 %  sodium chloride infusion   Intravenous PRN Anders Simmonds, MD 50 mL/hr at 02/20/22 0109 Restarted at 02/20/22 0109   amiodarone (NEXTERONE PREMIX) 360-4.14 MG/200ML-% (1.8 mg/mL) IV infusion  30 mg/hr Intravenous Continuous Cristal Generous, NP 16.67 mL/hr at 02/20/22 0600 30 mg/hr at 02/20/22 0600   arformoterol (BROVANA) nebulizer solution 15 mcg  15 mcg Nebulization BID Shearon Stalls, Rahul P, PA-C   15 mcg at 02/20/22 0715   bisacodyl (DULCOLAX) suppository 10 mg  10 mg Rectal Daily Candee Furbish, MD   10 mg at 02/20/22 1032   budesonide (PULMICORT) nebulizer solution 0.5 mg  0.5 mg Nebulization BID Shearon Stalls, Rahul P, PA-C   0.5 mg at 02/20/22 3299   Chlorhexidine Gluconate Cloth 2 % PADS 6 each  6 each Topical Daily Etheleen Nicks,  MD   6 each at 02/20/22 1053   docusate sodium (COLACE) capsule 100 mg  100 mg Oral BID PRN Germain Osgood, PA-C   100 mg at 02/18/22 2023   feeding supplement (BOOST / RESOURCE BREEZE) liquid 1 Container  1 Container Oral TID BM Candee Furbish, MD   1 Container at 02/20/22 1052   heparin ADULT infusion 100 units/mL (25000 units/252mL)  1,400 Units/hr Intravenous Continuous Erenest Blank, RPH 14 mL/hr at 02/20/22 0844 1,400 Units/hr at 02/20/22 0844   iohexol (OMNIPAQUE) 300 MG/ML solution 100 mL  100 mL  Intravenous Once PRN Greggory Keen, MD       levalbuterol Penne Lash) nebulizer solution 0.63 mg  0.63 mg Nebulization Q3H PRN Shearon Stalls, Rahul P, PA-C       multivitamin with minerals tablet 1 tablet  1 tablet Oral Daily Candee Furbish, MD   1 tablet at 02/20/22 1032   phenylephrine (NEO-SYNEPHRINE) 20mg /NS 231mL premix infusion  0-400 mcg/min Intravenous Titrated Masters, Katie, DO 15 mL/hr at 02/20/22 1029 20 mcg/min at 02/20/22 1029   polyethylene glycol (MIRALAX / GLYCOLAX) packet 17 g  17 g Oral Daily Candee Furbish, MD   17 g at 02/20/22 0034   prochlorperazine (COMPAZINE) tablet 10 mg  10 mg Oral Q6H PRN Hunsucker, Bonna Gains, MD   10 mg at 02/20/22 1032   revefenacin (YUPELRI) nebulizer solution 175 mcg  175 mcg Nebulization Daily Masters, [place], DO   175 mcg at 02/20/22 9179   sodium chloride flush (NS) 0.9 % injection 10-40 mL  10-40 mL Intracatheter PRN Candee Furbish, MD       vancomycin (VANCOCIN) IVPB 1000 mg/200 mL premix  1,000 mg Intravenous Q24H Lestine Box D, [facility]         Discharge Medications: Please see discharge summary for a list of discharge medications.  Relevant Imaging Results:  Relevant Lab Results:   Additional Information SSN: [id number].  Pt is vaccinated for covid with 2-3 boosters.  Joanne Chars, LCSW

## 2022-02-20 NOTE — TOC Initial Note (Addendum)
Transition of Care [facility]) - Initial/Assessment Note    Patient Details  Name: Jose Wolfe MRN: [id number] Date of Birth: 09/27/1946  Transition of Care [facility]) CM/SW Contact:    Joanne Chars, LCSW Phone Number: 02/20/2022, 11:57 AM  Clinical Narrative:   CSW met with pt and wife Margaretha Sheffield to discuss DC recommendation for SNF.  Permission given to speak with wife and with sister Bevely Palmer.  They are agreeable to SNF, choice document given, permission given to send out referral in hub.  Pt is vaccinated for covid with 2-3 boosters.               1420: Wife later asked for information on home hospice services, CSW printed this off and provided to her.  Expected Discharge Plan: Skilled Nursing Facility Barriers to Discharge: SNF Pending bed offer, Continued Medical Work up   Patient Goals and CMS Choice Patient states their goals for this hospitalization and ongoing recovery are:: quality of life, "do the things I want to do" CMS Medicare.gov Compare Post Acute Care list provided to:: Patient Represenative (must comment) Choice offered to / list presented to : Spouse  Expected Discharge Plan and Services Expected Discharge Plan: [place] In-house Referral: Clinical Social Work   Post Acute Care Choice: [place] Living arrangements for the past 2 months: [place]                                      Prior Living Arrangements/Services Living arrangements for the past 2 months: Single Family Home Lives with:: Spouse Patient language and need for interpreter reviewed:: Yes Do you feel safe going back to the place where you live?: Yes      Need for Family Participation in Patient Care: Yes (Comment) Care giver support system in place?: Yes (comment) Current home services: Other (comment) (none) Criminal Activity/Legal Involvement Pertinent to Current Situation/Hospitalization: No - Comment as needed  Activities of Daily  Living      Permission Sought/Granted Permission sought to share information with : Family Supports Permission granted to share information with : Yes, Verbal Permission Granted  Share Information with NAME: wife Margaretha Sheffield, sister Bevely Palmer  Permission granted to share info w AGENCY: SNF        Emotional Assessment Appearance:: Appears stated age Attitude/Demeanor/Rapport: Engaged Affect (typically observed): Appropriate Orientation: : Oriented to Self, Oriented to Place, Oriented to  Time, Oriented to Situation Alcohol / Substance Use: Not Applicable Psych Involvement: No (comment)  Admission diagnosis:  Pulmonary embolism ([place]) [I26.99] Patient Active Problem List   Diagnosis Date Noted   Protein-calorie malnutrition, severe 02/20/2022   Pulmonary embolism ([place]) 02/18/2022   Sepsis ([place])    DNR (do not resuscitate)    Leukopenia due to antineoplastic chemotherapy ([place]) 01/14/2022   Large bowel obstruction ([place]) 01/09/2022   COPD (chronic obstructive pulmonary disease) ([place])    Obstructive uropathy    Bilateral hydronephrosis    AKI (acute kidney injury) ([place]) due to bilateral obstructive uropathy    Hyperkalemia    Atypical chest pain    Normocytic anemia    Port-A-Cath in place 10/17/2021   Metastatic castration-resistant adenocarcinoma of prostate ([place]) 10/18/2019   PCP:  [name], MD Pharmacy:   [id number] - [place], [place] - [place] [address] [place] [address] Phone: [phone] Fax: [phone]     Social Determinants of Health (SDOH)  Interventions    Readmission Risk Interventions No flowsheet data found.

## 2022-02-20 NOTE — Progress Notes (Signed)
ANTICOAGULATION CONSULT NOTE  Pharmacy Consult for Heparin Indication: pulmonary embolus  Allergies  Allergen Reactions   Atorvastatin     Other reaction(s): Myalgias    Patient Measurements: Height: 6' (182.9 cm) Weight: 71 kg (156 lb 8.4 oz) IBW/kg (Calculated) : 77.6 Heparin Dosing Weight: 70.4 kg  Vital Signs: Temp: 98.2 F (36.8 C) (02/24 1935) Temp Source: Oral (02/24 1935) BP: 93/59 (02/24 2200) Pulse Rate: 83 (02/24 2200)  Labs: Recent Labs     0000 02/17/22 2334 02/18/22 0214 02/18/22 1001 02/19/22 0109 02/19/22 0619 02/20/22 0235 02/20/22 0432 02/20/22 1207 02/20/22 2152  HGB   < >  --  9.5*  --  9.0*  --  8.2* 10.5*  --   --   HCT   < >  --  29.2*  --  28.2*  --  24.5* 31.0*  --   --   PLT  --   --  289  --  210  --  217  --   --   --   HEPARINUNFRC  --   --   --    < >  --    < > 0.18*  --  0.12* 0.27*  CREATININE   < >  --  0.97  --  1.09  --  1.26*  --  1.56*  --   TROPONINIHS  --  722* 721*  --   --   --   --   --   --   --    < > = values in this interval not displayed.     Estimated Creatinine Clearance: 39.8 mL/min (A) (by C-G formula based on SCr of 1.56 mg/dL (H)).   Assessment: Jose Wolfe is a 76 year old male with bilateral PE with RHS. He is s/p thrombectomy on 2/22. Pharmacy consulted to dose IV heparin.  Heparin level slightly below goal at 0.27 units/mL.  No issues with infusion and no signs/symptoms of bleeding.   Goal of Therapy:  Heparin level 0.3-0.7 units/ml Monitor platelets by anticoagulation protocol: Yes   Plan:  Increase heparin infusion to 1650 units/hr Check 8 hr heparin level  Jose Wolfe, PharmD, BCPS, [place] 02/20/2022, 10:26 PM

## 2022-02-20 NOTE — Progress Notes (Signed)
Met with patient and wife at bedside.  Worsening clinical status with more hypoxemia, hypotension.  Discussed likely multifactorial etiologies including septic shock from staph epi bacteremia as well as concern for new PE given he had a lower extremity DVT.  This is after suction thrombectomy 2 days prior for submassive PE.  We discussed continuing care, discussed if things worsen you transitioning to comfort care.  Comfort measures were explained in detail.  He expressed after hearing about this he was ready for comfort care now.  He and his wife discussed with me in the room and came to agreement to continue current care for 24 hours.  He is not interested in additional aggressive procedures or interventions but okay to continue things for now.  Discussed that we will continue current care and readdress in 24 hours.  If he were to clinically worsen would readdress sooner and plan to transition to comfort measures should he worsen. °

## 2022-02-20 NOTE — Progress Notes (Addendum)
eLink Physician-Brief Progress Note Patient Name: Borna Wessinger DOB: Oct 18, 1946 MRN: [id number]   Date of Service  02/20/2022  HPI/Events of Note  pt complaining of feeling short of breath, sats 90-91, resp 37, HR 128, converted back into AFIB,  on amiodarone gtt. Meatsatic bone mets from prostate.   Ca 6.8, sodium 128, AST 613, ALT 429   Bilateral nephrostomy tubes.Urosepsis.  PE. On heparin gtt. Hg at 8.2. Cr 1.2  On nasal canula on 4 lit, went up from 1 lit. No fever. Not in pain. Marland Kitchen    eICU Interventions  CxR and ABG stat Asp precautions, might need BiPAP if co2 going up.       Intervention Category Intermediate Interventions: Respiratory distress - evaluation and management  Elmer Sow 02/20/2022, 3:51 AM  5 AM ABG reviewed. P/F ratio low. pH normal. CxR film seen: no chf or pneumonia or pneumothorax from yesterday 4 pm.  - discussed with RN. He looks stable on 6 lit nasal o2. Would not tolerate BiPAP. Anxious. Sats on the monitor 91%.

## 2022-02-20 NOTE — Progress Notes (Signed)
ANTICOAGULATION CONSULT NOTE - Follow Up Consult  Pharmacy Consult for Heparin Indication: pulmonary embolus  Allergies  Allergen Reactions   Atorvastatin     Other reaction(s): Myalgias    Patient Measurements: Height: 6' (182.9 cm) Weight: 70.4 kg (155 lb 3.3 oz) IBW/kg (Calculated) : 77.6 Heparin Dosing Weight: 70.4 kg  Vital Signs: Temp: 97.7 F (36.5 C) (02/24 0336) Temp Source: Oral (02/24 0336) BP: 81/59 (02/24 0300) Pulse Rate: 111 (02/24 0300)  Labs: Recent Labs    02/17/22 2140 02/17/22 2334 02/18/22 0214 02/18/22 1001 02/18/22 1631 02/19/22 0109 02/19/22 0619 02/20/22 0235  HGB 10.1*  --  9.5*  --   --  9.0*  --  8.2*  HCT 31.4*  --  29.2*  --   --  28.2*  --  24.5*  PLT 336  --  289  --   --  210  --  217  LABPROT 14.8  --   --   --   --   --   --   --   INR 1.2  --   --   --   --   --   --   --   HEPARINUNFRC  --   --   --    < > 0.69  --  0.31 0.18*  CREATININE 1.06  --  0.97  --   --  1.09  --  1.26*  TROPONINIHS 636* 722* 721*  --   --   --   --   --    < > = values in this interval not displayed.     Estimated Creatinine Clearance: 48.9 mL/min (A) (by C-G formula based on SCr of 1.26 mg/dL (H)).   Assessment: Jose Wolfe is a 76 year old male with bilateral PE with RHS. He is s/p thrombectomy on 2/22. He received a total of 6,000 units bolus in IR on 2/22.  Heparin level returned at lower end of goal at 0.31 this am. No issues during infusion and no signs/symptoms of bleeding. Will increase infusion slightly today to aim for middle of goal.  2/24 AM update: Heparin level low  Goal of Therapy:  Heparin level 0.3-0.7 units/ml Monitor platelets by anticoagulation protocol: Yes   Plan:  Increase heparin to 1400 units/hr 1200 heparin level Monitor CBC and signs/symptoms of bleeding  Narda Bonds, PharmD, BCPS Clinical Pharmacist Phone: [phone]

## 2022-02-20 NOTE — Consult Note (Addendum)
[facility] for Infectious Disease    Date of Admission:  02/17/2022     Total days of antibiotics 2                 Reason for Consult: bacteremia     Referring Provider: H[place]  Primary Care Provider: [name], MD   Assessment: Jose Wolfe is a 76 y.o. male admitted with acutely worsened shortness of breath/dyspnea in the setting of large b/l pulmonary emboli now s/p thrombectomy and IV heparin.  Has had some low grade fevers 100.8 with leukocytosis. Has been receiving 4 days of gram negative coverage for possible UTI (though I worry sample was taken from existing PNT collection system and not aseptically, which would make that hard to interpret - site labeled was "peripheral urine"). There was some description of purulent drainage from PNT site - no cellulitis appreciated today.   MRSE bacteremia noted in 4/4 bottles collected - will call micro to work up other sets to see if all susceptibilities match. Continue vancomycin for now.  Not clear that his ongoing SIRS physiology is due to sepsis vs significant cardiopulmonary burden of recent PEs. Has had TTEs, TEE is reasonable for him given what may be high grade bacteremia here. Would also be reasonable to wait to see further micro work up to see if all susceptibility reports from blood match up before considering putting him through it while PCCM supports worsening respiratory failure.   Stop ceftriaxone (urin growing Staph aureus and E faecalis all covered by vancomycin - CT scan does not show any inflammatory reaction surrounding kidneys. Tubes now replaced as one was withdrawn from kidney and not functionale.     Plan: Continue vancomycin  Follow further w/u from micro on MRSE to see if all match.  Stop ceftriaxone    Principal Problem:   Pulmonary embolism (HCC) Active Problems:   Protein-calorie malnutrition, severe   Bacteremia    arformoterol  15 mcg Nebulization BID   bisacodyl   10 mg Rectal Daily   budesonide (PULMICORT) nebulizer solution  0.5 mg Nebulization BID   Chlorhexidine Gluconate Cloth  6 each Topical Daily   feeding supplement  1 Container Oral TID BM   heparin  2,000 Units Intravenous Once   multivitamin with minerals  1 tablet Oral Daily   polyethylene glycol  17 g Oral Daily   revefenacin  175 mcg Nebulization Daily    HPI: Jose Wolfe is a 76 y.o. male admitted from home for dyspnea and weakness.   Notable pmhx metastatic advanced prostate cancer (dx 2020) on chemotherapy.   He has been having reported shortness of breath for about 4-6 months but acutely worsened which prompted ER visit on 02/17/2022. Found to have CTA demonstrating extensive b/l PEs with heart strain. Hypoxic with recover on 5 LPM [place]. Initially tachycardic/hypotensive but responded to fluids. He does note that his left lower leg has been swollen for the last week. Vascular duplex on 2/16 negative for DVT but suggested extrinsic compression of the L common iliac vein and possible obstruction to inguinal ligament.  There was concern for sepsis r/t UTI from ER team with abnormal urinalysis from existing PNTs (placed 1/16). There was some description of purulent drainage surrounding the exit site. Started on ceftriaxone for treatment with abnormal urinalysis.   Blood cultures from 2/21 growing MRSE in 4/4 bottles collected. Started on vancomycin for possible true bacteremia with concern for persistent SIRS /  respiratory failure despite thrombectomy of PEs.   He still feels "like shit" today. Jose Wolfe does not report any back pain or trouble with decreased / changed urine output from PNTs that he is aware of. He does not recall any specific symptoms that were new over the last few weeks leading to admission. Port accessed regularly w/o any pain or difficulty. No rash/cellulitis or open wound overlying site. Has been in place since October 2022.    Review of Systems: Review of  Systems  Constitutional:  Negative for chills and fever.  Respiratory:  Positive for shortness of breath. Negative for cough and sputum production.   Cardiovascular:  Positive for chest pain and leg swelling.  Gastrointestinal:  Negative for abdominal pain and vomiting.  Genitourinary:  Negative for dysuria.       PNTs in place   Skin:  Negative for rash.   Past Medical History:  Diagnosis Date   COPD (chronic obstructive pulmonary disease) ([place])    Prostate cancer ([place])     Social History   Tobacco Use   Smoking status: Never   Smokeless tobacco: Never  Vaping Use   Vaping Use: Never used  Substance Use Topics   Alcohol use: Yes    Alcohol/week: 1.0 standard drink    Types: 1 Glasses of wine per week    Comment: daily with dinner   Drug use: No    Family History  Problem Relation Age of Onset   Prostate cancer Brother    Cancer Maternal Uncle        unknown type   Allergies  Allergen Reactions   Atorvastatin     Other reaction(s): Myalgias    OBJECTIVE: Blood pressure (!) 88/59, pulse 99, temperature (!) 97.5 F (36.4 C), temperature source Axillary, resp. rate (!) 27, height 6' (1.829 m), weight 71 kg, SpO2 92 %.  Physical Exam Constitutional:      Appearance: He is ill-appearing.  HENT:     Mouth/Throat:     Mouth: Mucous membranes are dry.     Pharynx: Oropharynx is clear.  Eyes:     Conjunctiva/sclera: Conjunctivae normal.  Cardiovascular:     Rate and Rhythm: Tachycardia present. Rhythm irregular.     Heart sounds: No murmur heard. Pulmonary:     Effort: No respiratory distress.     Breath sounds: Normal breath sounds. No rhonchi.  Abdominal:     General: Bowel sounds are normal.     Tenderness: There is no right CVA tenderness or left CVA tenderness.  Skin:    General: Skin is warm and dry.     Comments: No overt cellulitis involving PNT insertion site.  Skin dry and cracked overlying arms. No open lesions noted.  Rt chest with port accessed  - no TTP and normal appearing.   Neurological:     Mental Status: He is alert and oriented to person, place, and time.    Lab Results Lab Results  Component Value Date   WBC 12.8 (H) 02/20/2022   HGB 10.5 (L) 02/20/2022   HCT 31.0 (L) 02/20/2022   MCV 90.4 02/20/2022   PLT 217 02/20/2022    Lab Results  Component Value Date   CREATININE 1.56 (H) 02/20/2022   BUN 32 (H) 02/20/2022   NA 130 (L) 02/20/2022   K 4.6 02/20/2022   CL 98 02/20/2022   CO2 16 (L) 02/20/2022    Lab Results  Component Value Date   ALT 824 (H) 02/20/2022   AST  1,536 (H) 02/20/2022   ALKPHOS 80 02/20/2022   BILITOT 0.4 02/20/2022     Microbiology: Recent Results (from the past 240 hour(s))  Blood Culture (routine x 2)     Status: Abnormal   Collection Time: 02/17/22  9:30 PM   Specimen: BLOOD  Result Value Ref Range Status   Specimen Description BLOOD SITE NOT SPECIFIED  Final   Special Requests   Final    BOTTLES DRAWN AEROBIC AND ANAEROBIC Blood Culture adequate volume   Culture  Setup Time   Final    GRAM POSITIVE COCCI CRITICAL RESULT CALLED TO, READ BACK BY AND VERIFIED WITH: PHARMD J. CBSWHQP [id number] @2320  FH  IN BOTH AEROBIC AND ANAEROBIC BOTTLES Performed at [facility], [place] [address], [place], [place] [address]    Culture STAPHYLOCOCCUS EPIDERMIDIS (A)  Final   Report Status 02/20/2022 FINAL  Final   Organism ID, Bacteria STAPHYLOCOCCUS EPIDERMIDIS  Final      Susceptibility   Staphylococcus epidermidis - MIC*    CIPROFLOXACIN <=0.5 SENSITIVE Sensitive     ERYTHROMYCIN >=8 RESISTANT Resistant     GENTAMICIN <=0.5 SENSITIVE Sensitive     OXACILLIN >=4 RESISTANT Resistant     TETRACYCLINE <=1 SENSITIVE Sensitive     VANCOMYCIN 1 SENSITIVE Sensitive     TRIMETH/SULFA <=10 SENSITIVE Sensitive     CLINDAMYCIN <=0.25 SENSITIVE Sensitive     RIFAMPIN <=0.5 SENSITIVE Sensitive     Inducible Clindamycin NEGATIVE Sensitive     * STAPHYLOCOCCUS EPIDERMIDIS  Blood Culture ID Panel  (Reflexed)     Status: Abnormal   Collection Time: 02/17/22  9:30 PM  Result Value Ref Range Status   Enterococcus faecalis NOT DETECTED NOT DETECTED Final   Enterococcus Faecium NOT DETECTED NOT DETECTED Final   Listeria monocytogenes NOT DETECTED NOT DETECTED Final   Staphylococcus species DETECTED (A) NOT DETECTED Final    Comment: CRITICAL RESULT CALLED TO, READ BACK BY AND VERIFIED WITH: PHARMD J LEDFORD [id number] 2320 FH    Staphylococcus aureus (BCID) NOT DETECTED NOT DETECTED Final   Staphylococcus epidermidis DETECTED (A) NOT DETECTED Final    Comment: Methicillin (oxacillin) resistant coagulase negative staphylococcus. Possible blood culture contaminant (unless isolated from more than one blood culture draw or clinical case suggests pathogenicity). No antibiotic treatment is indicated for blood  culture contaminants. CRITICAL RESULT CALLED TO, READ BACK BY AND VERIFIED WITH: PHARMD J LEDFORD [id number] 2320 FH    Staphylococcus lugdunensis NOT DETECTED NOT DETECTED Final   Streptococcus species NOT DETECTED NOT DETECTED Final   Streptococcus agalactiae NOT DETECTED NOT DETECTED Final   Streptococcus pneumoniae NOT DETECTED NOT DETECTED Final   Streptococcus pyogenes NOT DETECTED NOT DETECTED Final   A.calcoaceticus-baumannii NOT DETECTED NOT DETECTED Final   Bacteroides fragilis NOT DETECTED NOT DETECTED Final   Enterobacterales NOT DETECTED NOT DETECTED Final   Enterobacter cloacae complex NOT DETECTED NOT DETECTED Final   Escherichia coli NOT DETECTED NOT DETECTED Final   Klebsiella aerogenes NOT DETECTED NOT DETECTED Final   Klebsiella oxytoca NOT DETECTED NOT DETECTED Final   Klebsiella pneumoniae NOT DETECTED NOT DETECTED Final   Proteus species NOT DETECTED NOT DETECTED Final   Salmonella species NOT DETECTED NOT DETECTED Final   Serratia marcescens NOT DETECTED NOT DETECTED Final   Haemophilus influenzae NOT DETECTED NOT DETECTED Final   Neisseria meningitidis NOT DETECTED  NOT DETECTED Final   Pseudomonas aeruginosa NOT DETECTED NOT DETECTED Final   Stenotrophomonas maltophilia NOT DETECTED NOT DETECTED Final   Candida  albicans NOT DETECTED NOT DETECTED Final   Candida auris NOT DETECTED NOT DETECTED Final   Candida glabrata NOT DETECTED NOT DETECTED Final   Candida krusei NOT DETECTED NOT DETECTED Final   Candida parapsilosis NOT DETECTED NOT DETECTED Final   Candida tropicalis NOT DETECTED NOT DETECTED Final   Cryptococcus neoformans/gattii NOT DETECTED NOT DETECTED Final   Methicillin resistance mecA/C DETECTED (A) NOT DETECTED Final    Comment: CRITICAL RESULT CALLED TO, READ BACK BY AND VERIFIED WITH: Serita Grammes [id number] 2320 FH Performed at [facility], [address], [place], [place] [address]   Blood Culture (routine x 2)     Status: Abnormal (Preliminary result)   Collection Time: 02/17/22  9:40 PM   Specimen: BLOOD  Result Value Ref Range Status   Specimen Description BLOOD SITE NOT SPECIFIED  Final   Special Requests   Final    BOTTLES DRAWN AEROBIC AND ANAEROBIC Blood Culture adequate volume   Culture  Setup Time   Final    GRAM POSITIVE COCCI CRITICAL VALUE NOTED.  VALUE IS CONSISTENT WITH PREVIOUSLY REPORTED AND CALLED VALUE. IN BOTH AEROBIC AND ANAEROBIC BOTTLES    Culture (A)  Final    STAPHYLOCOCCUS EPIDERMIDIS SUSCEPTIBILITIES PERFORMED ON PREVIOUS CULTURE WITHIN THE LAST 5 DAYS. Performed at [facility], [place] [address], [place], [place] [address]    Report Status PENDING  Incomplete  MRSA Next Gen by PCR, Nasal     Status: None   Collection Time: 02/18/22  2:30 AM   Specimen: Nasal Mucosa; Nasal Swab  Result Value Ref Range Status   MRSA by PCR Next Gen NOT DETECTED NOT DETECTED Final    Comment: (NOTE) The GeneXpert MRSA Assay (FDA approved for NASAL specimens only), is one component of a comprehensive MRSA colonization surveillance program. It is not intended to diagnose MRSA infection nor to guide or  monitor treatment for MRSA infections. Test performance is not FDA approved in patients less than 33 years old. Performed at [facility], [place] [address] [place], [place] [address]   Culture, blood (routine x 2)     Status: None (Preliminary result)   Collection Time: 02/19/22 11:38 AM   Specimen: BLOOD LEFT FOREARM  Result Value Ref Range Status   Specimen Description BLOOD LEFT FOREARM  Final   Special Requests   Final    BOTTLES DRAWN AEROBIC AND ANAEROBIC Blood Culture adequate volume   Culture   Final    NO GROWTH < 24 HOURS Performed at [facility], [place] [address] [place], [place] [address]    Report Status PENDING  Incomplete  Culture, blood (routine x 2)     Status: None (Preliminary result)   Collection Time: 02/19/22 11:39 AM   Specimen: BLOOD RIGHT HAND  Result Value Ref Range Status   Specimen Description BLOOD RIGHT HAND  Final   Special Requests   Final    BOTTLES DRAWN AEROBIC AND ANAEROBIC Blood Culture adequate volume   Culture   Final    NO GROWTH < 24 HOURS Performed at [facility], [place] [address] [place], [place] [address]    Report Status PENDING  Incomplete    Janene Madeira, MSN, NP-C [facility] for Infectious [place] Pager: [phone]  02/20/2022 1:10 PM

## 2022-02-21 DIAGNOSIS — I2699 Other pulmonary embolism without acute cor pulmonale: Secondary | ICD-10-CM | POA: Diagnosis not present

## 2022-02-21 LAB — CBC
HCT: 23.6 % — ABNORMAL LOW (ref 39.0–52.0)
Hemoglobin: 8.1 g/dL — ABNORMAL LOW (ref 13.0–17.0)
MCH: 30.3 pg (ref 26.0–34.0)
MCHC: 34.3 g/dL (ref 30.0–36.0)
MCV: 88.4 fL (ref 80.0–100.0)
Platelets: 336 10*3/uL (ref 150–400)
RBC: 2.67 MIL/uL — ABNORMAL LOW (ref 4.22–5.81)
RDW: 16.5 % — ABNORMAL HIGH (ref 11.5–15.5)
WBC: 11.5 10*3/uL — ABNORMAL HIGH (ref 4.0–10.5)
nRBC: 1.2 % — ABNORMAL HIGH (ref 0.0–0.2)

## 2022-02-21 LAB — URINE CULTURE
Culture: >=100000 — AB
Culture: NO GROWTH

## 2022-02-21 LAB — COMPREHENSIVE METABOLIC PANEL
ALT: 674 U/L — ABNORMAL HIGH (ref 0–44)
AST: 618 U/L — ABNORMAL HIGH (ref 15–41)
Albumin: 2 g/dL — ABNORMAL LOW (ref 3.5–5.0)
Alkaline Phosphatase: 81 U/L (ref 38–126)
Anion gap: 10 (ref 5–15)
BUN: 32 mg/dL — ABNORMAL HIGH (ref 8–23)
CO2: 22 mmol/L (ref 22–32)
Calcium: 6.7 mg/dL — ABNORMAL LOW (ref 8.9–10.3)
Chloride: 101 mmol/L (ref 98–111)
Creatinine, Ser: 1.21 mg/dL (ref 0.61–1.24)
GFR, Estimated: >60 mL/min (ref >60)
Glucose, Bld: 102 mg/dL — ABNORMAL HIGH (ref 70–99)
Potassium: 4.2 mmol/L (ref 3.5–5.1)
Sodium: 133 mmol/L — ABNORMAL LOW (ref 135–145)
Total Bilirubin: 0.5 mg/dL (ref 0.3–1.2)
Total Protein: 5.1 g/dL — ABNORMAL LOW (ref 6.5–8.1)

## 2022-02-21 LAB — HEPARIN LEVEL (UNFRACTIONATED): Heparin Unfractionated: 0.3 IU/mL (ref 0.30–0.70)

## 2022-02-21 LAB — CORTISOL: Cortisol, Plasma: 24.3 ug/dL

## 2022-02-21 MED ORDER — APIXABAN 5 MG PO TABS: 5.0000 mg | ORAL_TABLET | Freq: Two times a day (BID) | ORAL | Status: DC

## 2022-02-21 MED ORDER — MIDODRINE HCL 5 MG PO TABS
10.0000 mg | ORAL_TABLET | Freq: Three times a day (TID) | ORAL | Status: DC
  Administered 2022-02-21 – 2022-02-23 (×6): 10 mg via ORAL
  Filled 2022-02-21 (×6): qty 2

## 2022-02-21 MED ORDER — APIXABAN 5 MG PO TABS
10.0000 mg | ORAL_TABLET | Freq: Two times a day (BID) | ORAL | Status: DC
  Administered 2022-02-21 – 2022-02-23 (×5): 10 mg via ORAL
  Filled 2022-02-21 (×5): qty 2

## 2022-02-21 NOTE — Progress Notes (Addendum)
ID PROGRESS NOTE  49yoM with h/o metastatic prostate ca, hx ofbilateral obstructive uropathy s/p bilateral PCN on 2/16, admitted for acute SOB/hypoxia and found to have large bilateral PE s/p bilateral PE thrombectomy by Dr. Annamaria Boots on 02/18/22, bilateral PCN exchange by Dr. Earleen Newport on 2/23. He had ongoing low grade fever, leukocytosis and AKI/and transaminitis-thus had ua-urine cx and blood cx drawn.  His blood cx grew MRSE in setting of long standing port. He is on vancomycin and ceftriaxone. Repeat blood cx on 2/23 are NGTD. Repeat urine cx are pending from 2/24. Wbc improving as well as transaminitis (now at 600s). Patient is interested in going home with hospice per RN  MRSE bacteremia:   - for now continue on vancomycin. Currently day 3. Can finish out course with linezolid 600mg  bid -total of 7 d. Given that he wants to go home with hospice, will not pursue removal of port. Defer to team if patient wants to continue with oral abtx.  Positive urine culture: unclear if true pathogen given that sample was taken from indwelling system- repeat urine cx are pending. Above abtx would also cover pathogens.  Elzie Rings [facility] for Infectious Diseases [phone]

## 2022-02-21 NOTE — TOC Progression Note (Signed)
Transition of Care [facility]) - Progression Note    Patient Details  Name: Jose Wolfe MRN: [id number] Date of Birth: [birth year]  Transition of Care [facility]) CM/SW Contact  Bartholomew Crews, RN Phone Number: [phone] 02/21/2022, 3:23 PM  Clinical Narrative:     Spoke with patient and spouse at the bedside to follow up with post acute transition plans. Notified by [facility] that caregivers were being selected to meet patients needs. [name] with [facility] has confirmed hospice eligibility and ordered needed DME (hospital bed, OBT, and oxygen). Signed DNR on chart. Patient and wife working on getting area at home cleared for hospital bed. TOC following for transition needs.   Expected Discharge Plan: Home w Hospice Care Barriers to Discharge: Continued Medical Work up  Expected Discharge Plan and Services Expected Discharge Plan: [place] In-house Referral: Clinical Social Work Discharge Planning Services: CM Consult Post Acute Care Choice: Hospice Living arrangements for the past 2 months: Single Family Home                                       Social Determinants of Health (SDOH) Interventions    Readmission Risk Interventions No flowsheet data found.

## 2022-02-21 NOTE — Progress Notes (Signed)
Patient is s/p bilateral PE thrombectomy by Dr. Annamaria Boots on 02/18/22, bilateral PCN exchange by Dr. Earleen Newport on 2/23.   Patient continues to have trouble breathing and repeat TTE showed reduced R heart function.  Per critical care's note, patient is not interested in additional aggressive procedures or interventions at this moment.   IR will sign off, available for further assistant 24/7.  Please call IR for questions and concerns.   Armando Gang Teodoro Jeffreys PA-C 02/21/2022 10:23 AM

## 2022-02-21 NOTE — Progress Notes (Addendum)
NAME:  Jose Wolfe, MRN:  [id number], DOB:  23-Feb-1946, LOS: 3 ADMISSION DATE:  02/17/2022, CONSULTATION DATE:  02/18/22 REFERRING MD:  Doren Custard, CHIEF COMPLAINT:  Dyspnea, weakness   History of Present Illness:  Jose Wolfe is a 76 y.o. male who has a PMH of metastatic advanced prostate CA with lymphadenopathy initially diagnosed in 2020 and currently undergoing chemotherapy.  He presented to [facility] ED 2/21 with generalized weakness and dyspnea.     In ED, he had CTA that demonstrated extensive bilateral PE with RV/LV of 2.27.  Initial troponin 722 and lactate 4.4.  He was placed on 5L O2 via  and O2 remained 99%.     UA in ED also suggestive of UTI.  Of note, he has bilateral nephrostomy tubes placed 2/16 after he had bilateral obstructive uropathy  Pertinent  Medical History  Metastatic prostate cancer Port-A-cath inplace COPD AKI due to bilateral obstructive uropathy s/p bilateral nephrostomy tubes Submassive pulmonary embolism DVT, left lower extremity  Significant Hospital Events: Including procedures, antibiotic start and stop dates in addition to other pertinent events   2/22 admit due to submassice PE w Rt hear strain on CTA. cdTPA with IR 2/23 4/4 bottles MRSE+ blood culture, bilateral nephrostomy tube exchanged, in afib with RVR 2/24 worsening O2 requirements, repeat TTE showed rt heart strain  Interim History / Subjective:  Patient reports that his breathing feels better than yesterday. He did cough up small amount of bloody sputum overnight. He also reports that he had some bloody discharge from his penis that has been happening for the last few weeks. No other concerns at this time.    Objective   Blood pressure 93/66, pulse 84, temperature 98.4 F (36.9 C), temperature source Oral, resp. rate (!) 21, height 6' (1.829 m), weight 77 kg, SpO2 91 %.        Intake/Output Summary (Last 24 hours) at 02/21/2022 0714 Last data filed at 02/21/2022 0600 Gross  per 24 hour  Intake 2174.82 ml  Output 1500 ml  Net 674.82 ml    Filed Weights   02/19/22 0500 02/20/22 0500 02/21/22 0500  Weight: 70.4 kg 71 kg 77 kg   Examination: General: sitting up in bed with blanket wrapped around shoulders, alert HENT: NCAT Lungs: CTAB, O2 saturations in low 90s on 7L HFNC, normal work of breathing Cardiovascular: Irregularly irregular, afib Abdomen: non-distended, non-tender, soft Extremities: 3+ non pitting edema in left lower extremity Neuro: alert and oriented x4 GU: bilateral nephrostomy tubes in place, yellow urine in bags  Net +2L Na 130-> 133 Creatinine 1.56-> 1.21, BUN 32 AST 1536-> 618 ALT 824-> 674 WBC 12.8-> 11.5 Hgb 8.2-> 8.1 Urine culture > 100,000 s aureus, enterococcus Repeat blood culture pending  Resolved Hospital Problem list   NAGMA  Assessment & Plan:  Sepsis 2/2 MRSE Bacteriemia Urosepsis, s aureus, enterococcus A:  Urine culture > 100,000 s aureus, enterococcus, Blood culture grew s epi 2/21, repeat blood culture ngtd WBC downtrending. Bilateral nephrostomy tubes exchanged 2/23.  P: -continue Vancomycin -f/u repeat blood culture -F/u urine culture sensitivities -Patient has right heart strain, not likely to do well with TEE -Neo with MAP goal greater than 65 -ID following  Acute bilateral Submassive Pulmonary Embolism s/p thrombectomy 2/22 Acute DVT of left external iliac vein, left common femoral vein, and left proximal profunda vein. A: repeat TTE showed severely reduced right ventricular function with mildly elevated pulmonary artery systolic pressure. Unsure if patient has additional clots in lungs since thrombectomy,  he was not interested in additional aggressive procedures or interventions. Will continue to address goals of care. P: - Patient living with metastatic prostate cancer - will transition Heparin to DOAC today - PT/OT following  A fib w RVR Afib 2/23  Elevated LFTs, amiodarone vs sepsis vs  congestive hepatopathy Amiodarone discontinued 2/24. AST 1536-> 618, ALT 824-> 674 -trend CMP  Constipation -dulcolax suppository daily -colace BID PRN -Miralax daily  Hyponatremia Na at 133 this AM. -trend CMP  COPD, no exacerbation - Brovana, pulmicort, yupelri - Levalbuterol prn  Protein calorie malnutrition -boost  Best Practice (right click and "Reselect all SmartList Selections" daily)   Diet/type: Regular consistency (see orders) DVT prophylaxis: systemic heparin GI prophylaxis: N/A Lines: N/A Foley:  N/A Code Status:  DNR Last date of multidisciplinary goals of care discussion [pending]

## 2022-02-21 NOTE — Evaluation (Signed)
Occupational Therapy Evaluation and Discharge Patient Details Name: Jose Wolfe MRN: [id number] DOB: 1946/04/19 Today's Date: 02/21/2022   History of Present Illness 76 yo male admitted 2/21 with weakness and dyspnea with bil submassive PE s/p IR thrombectomy 2/22. PMhx: met prostate CA undergoing chemo, COPD, obstructive uropathy s/p bil nephrostomy tubes 01/12/22   Clinical Impression   Pt agreeable to OT evaluation, although reports he is tired with sitting EOB. Declined OOB to chair. Pt tearful at times, states his wife wants him to go home with hospice care and he is concerned he will be too much of a burden to her. OT had long conversation with pt about pros and cons, getting more DME in the home. Pt's goal is to see his brother who is coming in from [place]. Tearful when speaking of not seeing the [employer] of [place] before his death. Pt is a well-traveled, interesting man.      Recommendations for follow up therapy are one component of a multi-disciplinary discharge planning process, led by the attending physician.  Recommendations may be updated based on patient status, additional functional criteria and insurance authorization.   Follow Up Recommendations  No OT follow up (Pt declining further OT, wants home with hospice vs inpatient hospice.)    Assistance Recommended at Discharge Frequent or constant Supervision/Assistance  Patient can return home with the following A lot of help with bathing/dressing/bathroom;Assistance with cooking/housework;Assist for transportation;Help with stairs or ramp for entrance;A little help with walking and/or transfers    Functional Status Assessment     Equipment Recommendations  Wheelchair (measurements OT);Wheelchair cushion (measurements OT);Hospital bed;BSC/3in1    Recommendations for Other Services       Precautions / Restrictions Precautions Precautions: Fall Precaution Comments: watch sats, bil nephrostomy tubes       Mobility Bed Mobility Overal bed mobility: Needs Assistance Bed Mobility: Supine to Sit, Sit to Supine     Supine to sit: Mod assist Sit to supine: Min assist   General bed mobility comments: Mod assist to raise trunk, min assist for LEs back into bed    Transfers                   General transfer comment: pt declined      Balance Overall balance assessment: Needs assistance   Sitting balance-Leahy Scale: Fair                                     ADL either performed or assessed with clinical judgement   ADL Overall ADL's : Needs assistance/impaired Eating/Feeding: Independent;Bed level   Grooming: Wash/dry hands;Wash/dry face;Sitting;Supervision/safety   Upper Body Bathing: Minimal assistance;Sitting   Lower Body Bathing: Moderate assistance;Sit to/from stand   Upper Body Dressing : Minimal assistance;Sitting   Lower Body Dressing: Moderate assistance;Sit to/from stand                 General ADL Comments: pt declined OOB/to chair     Vision Ability to See in Adequate Light: 0 Adequate Patient Visual Report: No change from baseline       Perception     Praxis      Pertinent Vitals/Pain Pain Assessment Pain Assessment: Faces Faces Pain Scale: No hurt     Hand Dominance Right   Extremity/Trunk Assessment Upper Extremity Assessment Upper Extremity Assessment: Generalized weakness;Overall [facility] for tasks assessed       Cervical / Trunk Assessment  Cervical / Trunk Assessment: Kyphotic   Communication Communication Communication: No difficulties   Cognition Arousal/Alertness: Awake/alert Behavior During Therapy: WFL for tasks assessed/performed Overall Cognitive Status: Within Functional Limits for tasks assessed                                 General Comments: pt tearful at times, aware of his prognosis     General Comments       Exercises     Shoulder Instructions      Home Living  Family/patient expects to be discharged to:: Private residence Living Arrangements: Spouse/significant other Available Help at Discharge: Family;Available 24 hours/day Type of Home: House Home Access: Stairs to enter     Home Layout: Two level;Able to live on main level with bedroom/bathroom Alternate Level Stairs-Number of Steps: 12 Alternate Level Stairs-Rails: Right;Left Bathroom Shower/Tub: Teacher, early years/pre: Standard     Home Equipment: Conservation officer, nature (2 wheels);Cane - single point;Tub bench;Toilet riser          Prior Functioning/Environment Prior Level of Function : Independent/Modified Independent             Mobility Comments: Retired Ecologist; drives; walks with a cane ADLs Comments: Independent        OT Problem List:        OT Treatment/Interventions:      OT Goals(Current goals can be found in the care plan section)    OT Frequency:      Co-evaluation              AM-PAC OT "6 Clicks" Daily Activity     Outcome Measure Help from another person eating meals?: None Help from another person taking care of personal grooming?: A Little Help from another person toileting, which includes using toliet, bedpan, or urinal?: A Lot Help from another person bathing (including washing, rinsing, drying)?: A Little Help from another person to put on and taking off regular upper body clothing?: A Lot   6 Click Score: 14   End of Session Equipment Utilized During Treatment: Oxygen Nurse Communication: Other (comment) (pt wants comfort measures and to go home)  Activity Tolerance: Patient limited by fatigue Patient left: in bed;with call bell/phone within reach;with bed alarm set  OT Visit Diagnosis: Muscle weakness (generalized) (M62.81)                Time: 5027-7412 OT Time Calculation (min): 35 min Charges:  OT General Charges $OT Visit: 1 Visit OT Evaluation $OT Eval Moderate Complexity: 1 Mod OT Treatments $Self  Care/Home Management : 8-22 mins  Jose Wolfe, OTR/L Acute Rehabilitation Services Pager: [phone] Office: [phone]  Jose Wolfe 02/21/2022, 9:46 AM

## 2022-02-21 NOTE — TOC Progression Note (Signed)
Transition of Care [facility]) - Progression Note    Patient Details  Name: Jose Wolfe MRN: [id number] Date of Birth: 1946-10-08  Transition of Care [facility]) CM/SW Contact  Bartholomew Crews, RN Phone Number: [phone] 02/21/2022, 1:00 PM  Clinical Narrative:     Notified by nursing that patient and wife wanted to discuss hospice care and 24/7 custodial care. Met with patient and wife at bedside. Referral to [facility] to discuss custodial care needs - patient has a Lobbyist. Referral to [facility] for hospice at home. Patient will need a hospital bed and oxygen. Patient already has a cane, RW, and 3/1. Patient will need ambulance transport home. Demographics verified. NCM provided patient with contact information for additional questions. TOC following for transition needs.   Expected Discharge Plan: Home w Hospice Care Barriers to Discharge: Continued Medical Work up  Expected Discharge Plan and Services Expected Discharge Plan: [place] In-house Referral: Clinical Social Work Discharge Planning Services: CM Consult Post Acute Care Choice: Hospice Living arrangements for the past 2 months: Single Family Home                                       Social Determinants of Health (SDOH) Interventions    Readmission Risk Interventions No flowsheet data found.

## 2022-02-21 NOTE — Progress Notes (Addendum)
[facility]) Hospital Liaison Note  Received request from Transitions of Care Manager Manya Silvas, RN, for hospice services at home after discharge. Chart and patient information reviewed by [facility] physician. Hospice eligibility confirmed.  Spoke with patient and wife Margaretha Sheffield to initiate education related to hospice philosophy, services and team approach to care. Patient/family verbalized understanding of information provided.   DME needs discussed. Patient has the following equipment in the home: cane, rolling walker and 3/1 BSC. Patient/family requests the following equipment for delivery: hospital bed, OBT and oxygen. Address has been verified and is correct in the chart. Margaretha Sheffield is the family contact to arrange time of equipment delivery.   Please send signed and completed DNR home with patient/family. Please provide prescriptions at discharge as needed to ensure ongoing symptom management.   ACC information and contact numbers given to family. Above information shared with [place].   Please do not hesitate to call with any hospice related questions or concerns.   Thank you for the opportunity to participate in this patient's care.   Nadene Rubins, RN, BSN [facility] Liaison [phone]

## 2022-02-22 DIAGNOSIS — I2699 Other pulmonary embolism without acute cor pulmonale: Secondary | ICD-10-CM | POA: Diagnosis not present

## 2022-02-22 LAB — CBC
HCT: 24.1 % — ABNORMAL LOW (ref 39.0–52.0)
Hemoglobin: 8.1 g/dL — ABNORMAL LOW (ref 13.0–17.0)
MCH: 30.3 pg (ref 26.0–34.0)
MCHC: 33.6 g/dL (ref 30.0–36.0)
MCV: 90.3 fL (ref 80.0–100.0)
Platelets: 292 10*3/uL (ref 150–400)
RBC: 2.67 MIL/uL — ABNORMAL LOW (ref 4.22–5.81)
RDW: 16.6 % — ABNORMAL HIGH (ref 11.5–15.5)
WBC: 10.8 10*3/uL — ABNORMAL HIGH (ref 4.0–10.5)
nRBC: 1.6 % — ABNORMAL HIGH (ref 0.0–0.2)

## 2022-02-22 LAB — BASIC METABOLIC PANEL
Anion gap: 10 (ref 5–15)
BUN: 25 mg/dL — ABNORMAL HIGH (ref 8–23)
CO2: 20 mmol/L — ABNORMAL LOW (ref 22–32)
Calcium: 6.7 mg/dL — ABNORMAL LOW (ref 8.9–10.3)
Chloride: 103 mmol/L (ref 98–111)
Creatinine, Ser: 1.01 mg/dL (ref 0.61–1.24)
GFR, Estimated: >60 mL/min (ref >60)
Glucose, Bld: 117 mg/dL — ABNORMAL HIGH (ref 70–99)
Potassium: 3.6 mmol/L (ref 3.5–5.1)
Sodium: 133 mmol/L — ABNORMAL LOW (ref 135–145)

## 2022-02-22 LAB — CULTURE, BLOOD (ROUTINE X 2): Special Requests: ADEQUATE

## 2022-02-22 MED ORDER — BISACODYL 10 MG RE SUPP: 10.0000 mg | Freq: Every day | RECTAL | Status: DC | PRN

## 2022-02-22 MED ORDER — MORPHINE SULFATE (PF) 2 MG/ML IV SOLN: 2.0000 mg | INTRAVENOUS | Status: DC | PRN

## 2022-02-22 MED ORDER — LINEZOLID 600 MG PO TABS
600.0000 mg | ORAL_TABLET | Freq: Two times a day (BID) | ORAL | Status: DC
  Administered 2022-02-22 – 2022-02-23 (×3): 600 mg via ORAL
  Filled 2022-02-22 (×3): qty 1

## 2022-02-22 NOTE — Progress Notes (Signed)
NAME:  Jose Wolfe, MRN:  [id number], DOB:  12-20-1946, LOS: 4 ADMISSION DATE:  02/17/2022, CONSULTATION DATE:  02/18/22 REFERRING MD:  Doren Custard, CHIEF COMPLAINT:  Dyspnea, weakness   History of Present Illness:  Jose Wolfe is a 76 y.o. male who has a PMH of metastatic advanced prostate CA with lymphadenopathy initially diagnosed in 2020 and currently undergoing chemotherapy.  He presented to [facility] ED 2/21 with generalized weakness and dyspnea.     In ED, he had CTA that demonstrated extensive bilateral PE with RV/LV of 2.27.  Initial troponin 722 and lactate 4.4.  He was placed on 5L O2 via [place] and O2 remained 99%.     UA in ED also suggestive of UTI.  Of note, he has bilateral nephrostomy tubes placed 2/16 after he had bilateral obstructive uropathy  Pertinent  Medical History  Metastatic prostate cancer [place] in[place] COPD AKI due to bilateral obstructive uropathy s/p bilateral nephrostomy tubes Submassive pulmonary embolism DVT, left lower extremity  Significant Hospital Events: Including procedures, antibiotic start and stop dates in addition to other pertinent events   2/22 admit due to submassice PE w Rt hear strain on CTA. cdTPA with IR 2/23 4/4 bottles MRSE+ blood culture, bilateral nephrostomy tube exchanged, in afib with RVR 2/24 worsening O2 requirements, repeat TTE showed rt heart strain 2/25 a bit better, he and wife express desire to go home with hospice  Interim History / Subjective:  Breathing unchanged, in RVR again, O2 needs up a bit  Objective   Blood pressure 97/66, pulse 89, temperature 98.2 F (36.8 C), resp. rate (!) 28, height 6' (1.829 m), weight 76.3 kg, SpO2 93 %.        Intake/Output Summary (Last 24 hours) at 02/22/2022 1138 Last data filed at 02/22/2022 1100 Gross per 24 hour  Intake 524.72 ml  Output 1550 ml  Net -1025.28 ml    Filed Weights   02/20/22 0500 02/21/22 0500 02/22/22 0348  Weight: 71 kg 77 kg 76.3 kg    Examination: General: sitting up in bed with blanket wrapped around shoulders, alert HENT: NCAT Lungs: CTAB, O2 saturations in low 90s on 10L HFNC, normal work of breathing Cardiovascular: Irregularly irregular, afib Abdomen: non-distended, non-tender, soft Extremities: 3+ non pitting edema in left lower extremity Neuro: alert and oriented x4 GU: bilateral nephrostomy tubes in place, yellow urine in bags   Resolved Hospital Problem list   NAGMA  Assessment & Plan:  Sepsis 2/2 MRSE Bacteriemia Urosepsis, s aureus, enterococcus A:  Urine culture > 100,000 s aureus, enterococcus, Blood culture grew s epi 2/21, repeat blood culture ngtd WBC downtrending. Bilateral nephrostomy tubes exchanged 2/23.  P: -s/p vancomycin, start linezolid per ID recs -f/u repeat blood culture -F/u urine culture sensitivities; do not think these are true pathogens as collected via existing neph tubes -Neo with MAP goal greater than 65, midodrine added 2/25 -appreciate ID assistance  Acute bilateral Submassive Pulmonary Embolism s/p thrombectomy 2/22 Acute DVT of left external iliac vein, left common femoral vein, and left proximal profunda vein. A: repeat TTE showed severely reduced right ventricular function with mildly elevated pulmonary artery systolic pressure. Unsure if patient has additional clots in lungs since thrombectomy, he was not interested in additional aggressive procedures or interventions. Will continue to address goals of care. P: - Eliquis  A fib w RVR Afib 2/23, amio caused LFTs to rise, avoiding AV nodal blockade due to hypotension  Elevated LFTs, amiodarone vs sepsis vs congestive hepatopathy Amiodarone discontinued  2/24 with decrease in LFTs  Constipation -dulcolax suppository PRN -colace BID PRN -Miralax daily  Hyponatremia Na at 133 this AM. -trend CMP  COPD, no exacerbation - Brovana, pulmicort, yupelri - Levalbuterol prn  Protein calorie  malnutrition -boost  Best Practice (right click and "Reselect all SmartList Selections" daily)   Diet/type: Regular consistency (see orders) DVT prophylaxis: systemic heparin GI prophylaxis: N/A Lines: N/A Foley:  N/A Code Status:  DNR Last date of multidisciplinary goals of care discussion [plan home hospice]  CRITICAL CARE Performed by: Bonna Gains Broly Hatfield   Total critical care time: 32 minutes  Critical care time was exclusive of separately billable procedures and treating other patients.  Critical care was necessary to treat or prevent imminent or life-threatening deterioration.  Critical care was time spent personally by me on the following activities: development of treatment plan with patient and/or surrogate as well as nursing, discussions with consultants, evaluation of patient's response to treatment, examination of patient, obtaining history from patient or surrogate, ordering and performing treatments and interventions, ordering and review of laboratory studies, ordering and review of radiographic studies, pulse oximetry and re-evaluation of patient's condition.    Lanier Clam, MD See Shea Evans for contact info

## 2022-02-22 NOTE — Progress Notes (Signed)
Jose Wolfe [facility]) Hospital Liaison Note   Received request from Transitions of Care Manager Manya Silvas, RN, for hospice services at home after discharge. Chart and patient information reviewed by [facility] physician. Hospice eligibility confirmed.   Spoke with patient and wife Jose Wolfe about discharge plans. As of today the DME could not be delivered until Monday. We will plan for a early morning D/C once DME in the home.    DME needs discussed. Patient has the following equipment in the home: cane, rolling walker and 3/1 BSC. Patient/family requests the following equipment for delivery: hospital bed, OBT and oxygen. Address has been verified and is correct in the chart. Jose Wolfe is the family contact to arrange time of equipment delivery.    Please send signed and completed DNR home with patient/family. Please provide prescriptions at discharge as needed to ensure ongoing symptom management.       Please do not hesitate to call with any hospice related questions or concerns.    Thank you for the opportunity to participate in this patient's care  Clementeen Hoof, BSN, [facility] [phone]

## 2022-02-22 NOTE — TOC Progression Note (Signed)
Transition of Care [facility]) - Progression Note    Patient Details  Name: Jose Wolfe MRN: [id number] Date of Birth: 1946/10/16  Transition of Care [facility]) CM/SW Contact  Bartholomew Crews, RN Phone Number: [phone] 02/22/2022, 3:59 PM  Clinical Narrative:     Spoke with patient at the bedside. DME to be delivered early tomorrow morning. Plan for transition home in the morning via ambulance. Patient has 4pm admission visit at home with [facility].   Expected Discharge Plan: Home w Hospice Care Barriers to Discharge: Continued Medical Work up  Expected Discharge Plan and Services Expected Discharge Plan: [place] In-house Referral: Clinical Social Work Discharge Planning Services: CM Consult Post Acute Care Choice: Hospice Living arrangements for the past 2 months: Single Family Home                                       Social Determinants of Health (SDOH) Interventions    Readmission Risk Interventions No flowsheet data found.

## 2022-02-23 ENCOUNTER — Other Ambulatory Visit (HOSPITAL_COMMUNITY): Payer: Self-pay

## 2022-02-23 ENCOUNTER — Telehealth: Payer: Self-pay

## 2022-02-23 DIAGNOSIS — I959 Hypotension, unspecified: Secondary | ICD-10-CM | POA: Diagnosis not present

## 2022-02-23 DIAGNOSIS — J9601 Acute respiratory failure with hypoxia: Secondary | ICD-10-CM | POA: Diagnosis not present

## 2022-02-23 DIAGNOSIS — I2699 Other pulmonary embolism without acute cor pulmonale: Secondary | ICD-10-CM | POA: Diagnosis not present

## 2022-02-23 DIAGNOSIS — R7881 Bacteremia: Secondary | ICD-10-CM | POA: Diagnosis not present

## 2022-02-23 DIAGNOSIS — R531 Weakness: Secondary | ICD-10-CM | POA: Diagnosis not present

## 2022-02-23 DIAGNOSIS — I2609 Other pulmonary embolism with acute cor pulmonale: Secondary | ICD-10-CM | POA: Diagnosis not present

## 2022-02-23 DIAGNOSIS — Z743 Need for continuous supervision: Secondary | ICD-10-CM | POA: Diagnosis not present

## 2022-02-23 DIAGNOSIS — Z7401 Bed confinement status: Secondary | ICD-10-CM | POA: Diagnosis not present

## 2022-02-23 LAB — CBC
HCT: 24.8 % — ABNORMAL LOW (ref 39.0–52.0)
Hemoglobin: 8.2 g/dL — ABNORMAL LOW (ref 13.0–17.0)
MCH: 30.1 pg (ref 26.0–34.0)
MCHC: 33.1 g/dL (ref 30.0–36.0)
MCV: 91.2 fL (ref 80.0–100.0)
Platelets: 290 10*3/uL (ref 150–400)
RBC: 2.72 MIL/uL — ABNORMAL LOW (ref 4.22–5.81)
RDW: 16.7 % — ABNORMAL HIGH (ref 11.5–15.5)
WBC: 11.1 10*3/uL — ABNORMAL HIGH (ref 4.0–10.5)
nRBC: 2 % — ABNORMAL HIGH (ref 0.0–0.2)

## 2022-02-23 MED ORDER — MIDODRINE HCL 10 MG PO TABS: 10.0000 mg | ORAL_TABLET | Freq: Three times a day (TID) | ORAL | 0 refills | Status: AC

## 2022-02-23 MED ORDER — APIXABAN 5 MG PO TABS
5.0000 mg | ORAL_TABLET | Freq: Two times a day (BID) | ORAL | 0 refills | Status: AC
Start: 2022-02-28 — End: ?

## 2022-02-23 MED ORDER — HEPARIN SOD (PORK) LOCK FLUSH 100 UNIT/ML IV SOLN
500.0000 [IU] | INTRAVENOUS | Status: AC | PRN
  Administered 2022-02-23: 500 [IU]
  Filled 2022-02-23: qty 5

## 2022-02-23 MED ORDER — APIXABAN 5 MG PO TABS: 10.0000 mg | ORAL_TABLET | Freq: Two times a day (BID) | ORAL | 2 refills | Status: DC

## 2022-02-23 MED ORDER — LINEZOLID 600 MG PO TABS
600.0000 mg | ORAL_TABLET | Freq: Two times a day (BID) | ORAL | 0 refills | Status: AC
Start: 2022-02-23 — End: ?
  Filled 2022-02-23: qty 5, 3d supply, fill #0

## 2022-02-23 MED ORDER — MORPHINE SULFATE 20 MG/5ML PO SOLN: 2.5000 mg | ORAL | 0 refills | Status: AC | PRN

## 2022-02-23 MED ORDER — APIXABAN 5 MG PO TABS: ORAL_TABLET | ORAL | 0 refills | Status: AC

## 2022-02-23 NOTE — TOC Transition Note (Signed)
Transition of Care [facility]) - CM/SW Discharge Note   Patient Details  Name: Jose Wolfe MRN: [id number] Date of Birth: [birth year]  Transition of Care [facility]) CM/SW Contact:  Joanne Chars, LCSW Phone Number: 02/23/2022, 10:55 AM   Clinical Narrative:   Pt discharging home with [facility] hospice.  PTAR was asked to pick up pt after 12 noon but in time for 4pm [facility] intake appt.      Final next level of care: Home w Hospice Care Barriers to Discharge: Barriers Resolved   Patient Goals and CMS Choice Patient states their goals for this hospitalization and ongoing recovery are:: home with hospice care CMS Medicare.gov Compare Post Acute Care list provided to:: Patient Choice offered to / list presented to : Patient, Spouse  Discharge Placement                Patient to be transferred to facility by: PTAR Name of family member notified: wife Margaretha Sheffield Patient and family notified of of transfer: 02/23/22  Discharge Plan and Services In-house Referral: Clinical Social Work Discharge Planning Services: [employer] Consult Post Acute Care Choice: Hospice                               Social Determinants of Health (SDOH) Interventions     Readmission Risk Interventions No flowsheet data found.

## 2022-02-23 NOTE — TOC Benefit Eligibility Note (Signed)
Patient Advocate Encounter  Insurance verification completed.    The patient is currently admitted and upon discharge could be taking linezolid (Zyvox) 600 mg tablets.  Requires Prior Authorization  The patient is insured through [employer] Medicare Part D    Janney Priego, CPhT Pharmacy Patient Advocate Specialist [facility] Patient Advocate Team Direct Number: [phone]  Fax: [phone]        

## 2022-02-23 NOTE — Telephone Encounter (Signed)
Patient has accepted hospice services with [facility]. They are asking if Dr. Alen Blew will be the attending.

## 2022-02-23 NOTE — Discharge Summary (Signed)
Physician Discharge Summary         Patient ID: Jose Wolfe MRN: [id number] DOB/AGE: 1946/07/18 76 y.o.  Admit date: 02/17/2022 Discharge date: 02/23/2022  Discharge Diagnoses:    Active Hospital Problems   Diagnosis Date Noted   Pulmonary embolism ([place]) 02/18/2022   Protein-calorie malnutrition, severe 02/20/2022   Bacteremia 02/20/2022    Resolved Hospital Problems  No resolved problems to display.      Discharge summary    2/22 admit due to submassive PE w Rt hear strain on CTA. cdTPA with IR 2/23 4/4 bottles MRSE+ blood culture, bilateral nephrostomy tube exchanged, in afib with RVR 2/24 worsening O2 requirements, repeat TTE showed rt heart strain 2/25 a bit better, he and wife express desire to go home with hospice 2/27 discharged home with hospice on eliquis and finishing course of linezolid.  Discharge Plan by Active Problems    Sepsis 2/2 MRSE Bacteriemia Urosepsis, s aureus, enterococcus Patient will complete 7 day course of antibiotics on March 1st with Linezolid 600mg  BID.   Acute bilateral Submassive Pulmonary Embolism s/p thrombectomy 2/22 Acute DVT of left external iliac vein, left common femoral vein, and left proximal profunda vein. TTE following thrombectomy showed severely reduced right ventricular function with mildly elevated pulmonary artery systolic pressure. Unsure if patient has additional clots in lungs since thrombectomy, he was not interested in additional aggressive procedures or interventions. Patient will continue taking eliquis 5 mg BID while in hospice.   A fib w RVR Afib 2/23, amio caused LFTs to rise, avoiding AV nodal blockade due to hypotension. He will be taking eliquis while in hospice.   Elevated LFTs, amiodarone vs sepsis vs congestive hepatopathy Amiodarone discontinued 2/24 with decrease in LFTs    COPD, no exacerbation Continue Brovana, pulmicort, and yupelri.  Protein calorie  malnutrition boost   Significant Hospital tests/ studies   CMP Latest Ref Rng & Units 02/22/2022 02/21/2022 02/20/2022  Glucose 70 - 99 mg/dL 117(H) 102(H) 146(H)  BUN 8 - 23 mg/dL 25(H) 32(H) 32(H)  Creatinine 0.61 - 1.24 mg/dL 1.01 1.21 1.56(H)  Sodium 135 - 145 mmol/L 133(L) 133(L) 130(L)  Potassium 3.5 - 5.1 mmol/L 3.6 4.2 4.6  Chloride 98 - 111 mmol/L 103 101 98  CO2 22 - 32 mmol/L 20(L) 22 16(L)  Calcium 8.9 - 10.3 mg/dL 6.7(L) 6.7(L) 6.8(L)  Total Protein 6.5 - 8.1 g/dL - 5.1(L) 5.2(L)  Total Bilirubin 0.3 - 1.2 mg/dL - 0.5 0.4  Alkaline Phos 38 - 126 U/L - 81 80  AST 15 - 41 U/L - 618(H) 1,536(H)  ALT 0 - 44 U/L - 674(H) 824(H)    CBC Latest Ref Rng & Units 02/23/2022 02/22/2022 02/21/2022  WBC 4.0 - 10.5 K/uL 11.1(H) 10.8(H) 11.5(H)  Hemoglobin 13.0 - 17.0 g/dL 8.2(L) 8.1(L) 8.1(L)  Hematocrit 39.0 - 52.0 % 24.8(L) 24.1(L) 23.6(L)  Platelets 150 - 400 K/uL 290 292 336    2/22 CTA chest: IMPRESSION: 1. Extensive bilateral pulmonary embolism with evidence of right heart strain (RV/LV ratio of approximately 2.27). 2. Findings consistent with diffuse osseous metastatic disease. 3. Small hiatal hernia.  2/24 CXR:  IMPRESSION: Cardiomegaly.Mild atelectasis.  2/24 TTE: 1. Left ventricular ejection fraction, by estimation, is 60 to 65%. The  left ventricle has normal function. There is the interventricular septum  is flattened in systole, consistent with right ventricular pressure  overload.   2. Right ventricular systolic function is severely reduced. The right  ventricular size is severely enlarged. There is mildly elevated  pulmonary  artery systolic pressure.   3. Left atrial size was moderately dilated.   4. Trivial mitral valve regurgitation.   5. Tricuspid valve regurgitation is severe.   6. The aortic valve is tricuspid. There is mild calcification of the  aortic valve. Aortic valve regurgitation is trivial.   7. The inferior vena cava is dilated in size with <50%  respiratory  variability, suggesting right atrial pressure of 15 mmHg.  Procedures    2/22 Thrombectomy  2/23 Bilateral nephrostomy tube exchange   Culture data/antimicrobials   Blood culture 2/21 staph epi Urine culture 2/25 enterococcus faecalis, staph aureus   Consults  ID, IR    Discharge Exam: BP 102/70    Pulse 77    Temp (!) 97.4 F (36.3 C) (Oral)    Resp (!) 21    Ht 6' (1.829 m)    Wt 76.3 kg    SpO2 98%    BMI 22.81 kg/m   General: sitting up in bed with blanket wrapped around shoulders, alert HENT: NCAT Lungs: CTAB, O2 saturations in low 90s on 7L HFNC, normal work of breathing Cardiovascular: Irregularly irregular, afib Abdomen: non-distended, non-tender, soft Extremities: 3+ non pitting edema in left lower extremity Neuro: alert and oriented x4 GU: bilateral nephrostomy tubes in place, yellow urine in bags   Labs at discharge   Lab Results  Component Value Date   CREATININE 1.01 02/22/2022   BUN 25 (H) 02/22/2022   NA 133 (L) 02/22/2022   K 3.6 02/22/2022   CL 103 02/22/2022   CO2 20 (L) 02/22/2022   Lab Results  Component Value Date   WBC 11.1 (H) 02/23/2022   HGB 8.2 (L) 02/23/2022   HCT 24.8 (L) 02/23/2022   MCV 91.2 02/23/2022   PLT 290 02/23/2022   Lab Results  Component Value Date   ALT 674 (H) 02/21/2022   AST 618 (H) 02/21/2022   ALKPHOS 81 02/21/2022   BILITOT 0.5 02/21/2022   Lab Results  Component Value Date   INR 1.2 02/17/2022   INR 1.1 01/10/2022   INR 1.02 05/28/2010    Current radiological studies    No results found.  Disposition:    Discharge disposition: 50-Hospice/Home     Hospice  Discharge Instructions     Diet - low sodium heart healthy   Complete by: As directed    Discharge instructions   Complete by: As directed    Jose Wolfe, Jose Wolfe were recently Wolfe to [facility] for bilateral pulmonary embolisms and found to have bacteria in your blood.   Please continue taking Eliquis twice  daily and you will have 5 more doses (evening dose 2/27 and two times daily for 2/28, 2/29).  Sincerely, Christiana Fuchs, DO   Increase activity slowly   Complete by: As directed    No wound care   Complete by: As directed        Allergies as of 02/23/2022       Reactions   Atorvastatin    Other reaction(s): Myalgias        Medication List     STOP taking these medications    acetaminophen 500 MG tablet Commonly known as: TYLENOL   docusate sodium 100 MG capsule Commonly known as: COLACE   leuprolide acetate (6 Month) 45 MG injection Generic drug: leuprolide (6 Month)   megestrol 625 MG/5ML suspension Commonly known as: MEGACE ES       TAKE these medications    apixaban 5  MG Tabs tablet Commonly known as: ELIQUIS Take 2 tablets (10 mg total) by mouth 2 (two) times daily for 4 days, THEN 1 tablet (5 mg total) 2 (two) times daily for 26 days. Start taking on: February 23, 2022   apixaban 5 MG Tabs tablet Commonly known as: ELIQUIS Take 1 tablet (5 mg total) by mouth 2 (two) times daily. Start taking on: February 28, 2022   betamethasone dipropionate 0.05 % cream Apply 1 application topically 2 (two) times daily as needed (for psoriasis).   budesonide-formoterol 160-4.5 MCG/ACT inhaler Commonly known as: SYMBICORT Inhale 2 puffs into the lungs 2 (two) times daily.   calcium-vitamin D 500-200 MG-UNIT tablet Commonly known as: OSCAL WITH D Take 1 tablet by mouth 2 (two) times daily.   guaiFENesin 600 MG 12 hr tablet Commonly known as: MUCINEX Take 600 mg by mouth 2 (two) times daily as needed for cough.   lidocaine-prilocaine cream Commonly known as: EMLA Apply 1 application topically as needed. What changed: reasons to take this   linezolid 600 MG tablet Commonly known as: ZYVOX Take 1 tablet (600 mg total) by mouth every 12 (twelve) hours.   midodrine 10 MG tablet Commonly known as: PROAMATINE Take 1 tablet (10 mg total) by mouth 3 (three) times  daily with meals.   morphine 20 MG/5ML solution Take 0.6 mLs (2.4 mg total) by mouth every 2 (two) hours as needed for pain.   prochlorperazine 10 MG tablet Commonly known as: COMPAZINE Take 1 tablet (10 mg total) by mouth every 6 (six) hours as needed for nausea or vomiting.   senna-docusate 8.6-50 MG tablet Commonly known as: Senna S Take 1 tablet by mouth at bedtime as needed for mild constipation.   triamcinolone cream 0.1 % Commonly known as: KENALOG Apply 1 application topically 2 (two) times daily as needed (for psoriasis).   Ventolin HFA 108 (90 Base) MCG/ACT inhaler Generic drug: albuterol INHALE 1 PUFF BY MOUTH EVERY 4 HOURS AS NEEDED What changed:  how much to take how to take this when to take this reasons to take this additional instructions   Vitamin B Complex Tabs Take 1 tablet by mouth at bedtime.         Follow-up appointment   N/a Discharge Condition:    serious  Jose Bo. Jose Wolfe, D.O.  Internal Medicine Resident, PGY-1 [name]  Pager: [phone] 12:25 PM, 02/23/2022

## 2022-02-23 NOTE — Progress Notes (Signed)
Manufacturing engineer [facility])  Jose Wolfe is set to discharge home today once DME is in place. This is set for 10am-12pm. Patient will be admitted to hospice at 4pm.   Please send signed and completed DNR home with patient/family. Please provide prescriptions at discharge as needed to ensure ongoing symptom management.       Please do not hesitate to call with any hospice related questions or concerns.    Thank you for the opportunity to participate in this patient's care   Clementeen Hoof, BSN, [facility] [phone]

## 2022-02-23 NOTE — TOC Benefit Eligibility Note (Signed)
Patient Advocate Encounter  Prior Authorization for linezolid (Zyvox) 600 mg has been approved.    PA# [id number] Effective dates: 02/23/2022 through 03/23/2022  Patients co-pay is $25.15.     Lyndel Safe, [place] Patient Advocate Specialist [place] Patient Advocate Team Direct Number: [phone]  Fax: [phone]

## 2022-02-23 NOTE — TOC Benefit Eligibility Note (Signed)
Patient Advocate Encounter   Received notification that prior authorization for linezolid (Zyvox) 600 mg tablets is required.   PA submitted on 02/23/2022 [place] Status is pending       Lyndel Safe, [place] Patient Advocate Specialist [place] Patient Advocate Team Direct Number: [phone]  Fax: [phone]

## 2022-02-24 LAB — CULTURE, BLOOD (ROUTINE X 2)
Culture: NO GROWTH
Culture: NO GROWTH
Special Requests: ADEQUATE
Special Requests: ADEQUATE

## 2022-02-26 DIAGNOSIS — R7881 Bacteremia: Secondary | ICD-10-CM | POA: Diagnosis not present

## 2022-02-26 DIAGNOSIS — E46 Unspecified protein-calorie malnutrition: Secondary | ICD-10-CM | POA: Diagnosis not present

## 2022-02-26 DIAGNOSIS — I2699 Other pulmonary embolism without acute cor pulmonale: Secondary | ICD-10-CM | POA: Diagnosis not present

## 2022-02-26 DIAGNOSIS — Z09 Encounter for follow-up examination after completed treatment for conditions other than malignant neoplasm: Secondary | ICD-10-CM | POA: Diagnosis not present

## 2022-02-26 DIAGNOSIS — D6869 Other thrombophilia: Secondary | ICD-10-CM | POA: Diagnosis not present

## 2022-03-10 MED FILL — Dexamethasone Sodium Phosphate Inj 100 MG/10ML: INTRAMUSCULAR | Qty: 1 | Status: AC

## 2022-03-11 ENCOUNTER — Inpatient Hospital Stay: Payer: Medicare HMO

## 2022-03-11 ENCOUNTER — Inpatient Hospital Stay: Payer: Medicare HMO | Admitting: Oncology

## 2022-03-12 ENCOUNTER — Other Ambulatory Visit (HOSPITAL_COMMUNITY): Payer: Self-pay

## 2022-03-13 ENCOUNTER — Inpatient Hospital Stay: Payer: Medicare HMO

## 2022-03-17 ENCOUNTER — Other Ambulatory Visit: Payer: Self-pay | Admitting: Internal Medicine

## 2022-04-24 ENCOUNTER — Ambulatory Visit (HOSPITAL_COMMUNITY)
Admission: RE | Admit: 2022-04-24 | Discharge: 2022-04-24 | Disposition: A | Discharge status: Home / Self Care | Payer: Medicare HMO | Source: Ambulatory Visit | Attending: Interventional Radiology | Admitting: Interventional Radiology

## 2022-04-24 ENCOUNTER — Emergency Department (HOSPITAL_COMMUNITY)

## 2022-04-24 ENCOUNTER — Encounter (HOSPITAL_COMMUNITY): Payer: Self-pay

## 2022-04-24 ENCOUNTER — Other Ambulatory Visit (HOSPITAL_COMMUNITY): Payer: Self-pay | Admitting: Interventional Radiology

## 2022-04-24 ENCOUNTER — Other Ambulatory Visit: Payer: Self-pay

## 2022-04-24 ENCOUNTER — Emergency Department (HOSPITAL_COMMUNITY)
Admission: EM | Admit: 2022-04-24 | Discharge: 2022-04-24 | Disposition: A | Discharge status: Home / Self Care | Attending: Emergency Medicine | Admitting: Emergency Medicine

## 2022-04-24 DIAGNOSIS — R791 Abnormal coagulation profile: Secondary | ICD-10-CM | POA: Insufficient documentation

## 2022-04-24 DIAGNOSIS — R000 Tachycardia, unspecified: Secondary | ICD-10-CM | POA: Diagnosis not present

## 2022-04-24 DIAGNOSIS — Z4801 Encounter for change or removal of surgical wound dressing: Secondary | ICD-10-CM | POA: Diagnosis not present

## 2022-04-24 DIAGNOSIS — Z7901 Long term (current) use of anticoagulants: Secondary | ICD-10-CM | POA: Diagnosis not present

## 2022-04-24 DIAGNOSIS — R109 Unspecified abdominal pain: Secondary | ICD-10-CM | POA: Diagnosis present

## 2022-04-24 DIAGNOSIS — K5900 Constipation, unspecified: Secondary | ICD-10-CM | POA: Diagnosis not present

## 2022-04-24 DIAGNOSIS — Z5189 Encounter for other specified aftercare: Secondary | ICD-10-CM

## 2022-04-24 DIAGNOSIS — T83032D Leakage of nephrostomy catheter, subsequent encounter: Secondary | ICD-10-CM

## 2022-04-24 DIAGNOSIS — R0902 Hypoxemia: Secondary | ICD-10-CM | POA: Diagnosis not present

## 2022-04-24 DIAGNOSIS — R509 Fever, unspecified: Secondary | ICD-10-CM | POA: Diagnosis not present

## 2022-04-24 DIAGNOSIS — Z743 Need for continuous supervision: Secondary | ICD-10-CM | POA: Diagnosis not present

## 2022-04-24 DIAGNOSIS — R9431 Abnormal electrocardiogram [ECG] [EKG]: Secondary | ICD-10-CM | POA: Diagnosis not present

## 2022-04-24 DIAGNOSIS — Z4800 Encounter for change or removal of nonsurgical wound dressing: Secondary | ICD-10-CM | POA: Insufficient documentation

## 2022-04-24 LAB — COMPREHENSIVE METABOLIC PANEL
ALT: 15 U/L (ref 0–44)
AST: 94 U/L — ABNORMAL HIGH (ref 15–41)
Albumin: 2.5 g/dL — ABNORMAL LOW (ref 3.5–5.0)
Alkaline Phosphatase: 293 U/L — ABNORMAL HIGH (ref 38–126)
Anion gap: 14 (ref 5–15)
BUN: 23 mg/dL (ref 8–23)
CO2: 22 mmol/L (ref 22–32)
Calcium: 8.3 mg/dL — ABNORMAL LOW (ref 8.9–10.3)
Chloride: 93 mmol/L — ABNORMAL LOW (ref 98–111)
Creatinine, Ser: 0.74 mg/dL (ref 0.61–1.24)
GFR, Estimated: >60 mL/min (ref >60)
Glucose, Bld: 119 mg/dL — ABNORMAL HIGH (ref 70–99)
Potassium: 5.3 mmol/L — ABNORMAL HIGH (ref 3.5–5.1)
Sodium: 129 mmol/L — ABNORMAL LOW (ref 135–145)
Total Bilirubin: 0.9 mg/dL (ref 0.3–1.2)
Total Protein: 6.2 g/dL — ABNORMAL LOW (ref 6.5–8.1)

## 2022-04-24 LAB — URINALYSIS, ROUTINE W REFLEX MICROSCOPIC
Bilirubin Urine: NEGATIVE
Glucose, UA: NEGATIVE mg/dL
Ketones, ur: NEGATIVE mg/dL
Nitrite: NEGATIVE
Protein, ur: 100 mg/dL — AB
Specific Gravity, Urine: 1.014 (ref 1.005–1.030)
WBC, UA: >50 WBC/hpf — ABNORMAL HIGH (ref 0–5)
pH: 6 (ref 5.0–8.0)

## 2022-04-24 LAB — CBC WITH DIFFERENTIAL/PLATELET
Abs Immature Granulocytes: 0.1 10*3/uL — ABNORMAL HIGH (ref 0.00–0.07)
Basophils Absolute: 0 10*3/uL (ref 0.0–0.1)
Basophils Relative: 0 %
Eosinophils Absolute: 0 10*3/uL (ref 0.0–0.5)
Eosinophils Relative: 0 %
HCT: 23.2 % — ABNORMAL LOW (ref 39.0–52.0)
Hemoglobin: 7.7 g/dL — ABNORMAL LOW (ref 13.0–17.0)
Immature Granulocytes: 1 %
Lymphocytes Relative: 5 %
Lymphs Abs: 0.4 10*3/uL — ABNORMAL LOW (ref 0.7–4.0)
MCH: 28.2 pg (ref 26.0–34.0)
MCHC: 33.2 g/dL (ref 30.0–36.0)
MCV: 85 fL (ref 80.0–100.0)
Monocytes Absolute: 0.9 10*3/uL (ref 0.1–1.0)
Monocytes Relative: 11 %
Neutro Abs: 6.2 10*3/uL (ref 1.7–7.7)
Neutrophils Relative %: 83 %
Platelets: 311 10*3/uL (ref 150–400)
RBC: 2.73 MIL/uL — ABNORMAL LOW (ref 4.22–5.81)
RDW: 18.5 % — ABNORMAL HIGH (ref 11.5–15.5)
WBC: 7.6 10*3/uL (ref 4.0–10.5)
nRBC: 0.8 % — ABNORMAL HIGH (ref 0.0–0.2)

## 2022-04-24 LAB — PROTIME-INR
INR: 1.5 — ABNORMAL HIGH (ref 0.8–1.2)
Prothrombin Time: 18.3 seconds — ABNORMAL HIGH (ref 11.4–15.2)

## 2022-04-24 LAB — LACTIC ACID, PLASMA
Lactic Acid, Venous: 2 mmol/L (ref 0.5–1.9)
Lactic Acid, Venous: 1.7 mmol/L (ref 0.5–1.9)

## 2022-04-24 MED ORDER — AMOXICILLIN-POT CLAVULANATE 875-125 MG PO TABS: 1.0000 | ORAL_TABLET | Freq: Two times a day (BID) | ORAL | 0 refills | Status: DC

## 2022-04-24 MED ORDER — SODIUM CHLORIDE 0.9 % IV BOLUS
1000.0000 mL | Freq: Once | INTRAVENOUS | Status: AC
  Administered 2022-04-24: 1000 mL via INTRAVENOUS

## 2022-04-24 MED ORDER — AMOXICILLIN-POT CLAVULANATE 875-125 MG PO TABS: 1.0000 | ORAL_TABLET | Freq: Two times a day (BID) | ORAL | 0 refills | Status: AC

## 2022-04-24 NOTE — ED Provider Notes (Signed)
[facility] EMERGENCY DEPARTMENT Provider Note   CSN: [id number] Arrival date & time: 04/24/22  1401     History  Chief Complaint  Patient presents with   Flank Pain    Jose Wolfe is a 76 y.o. male who presents to the ED complaining of flank pain onset last night. Pt notes that his nephrostomy tube was accidentally pulled out about 2 days ago.  He had an appointment today with IR to have the nephrostomy tube replaced however upon arrival, IR suggested he come to the emergency department for evaluation.  Patient has associated constipation (for the past 2 days, patient use a suppository last night).  Wife notes the patient has not had food for the past 3 weeks however he has been consuming milk and water.  Patient denies fever, chills, cough, shortness of breath, chest pain, abdominal pain, nausea, vomiting, back pain.  The history is provided by the patient. No language interpreter was used.      Home Medications Prior to Admission medications   Medication Sig Start Date End Date Taking? Authorizing Provider  apixaban (ELIQUIS) 5 MG TABS tablet Take 1 tablet (5 mg total) by mouth 2 (two) times daily. 02/28/22   Masters, Joellen Jersey, DO  apixaban (ELIQUIS) 5 MG TABS tablet Take 2 tablets (10 mg total) by mouth 2 (two) times daily for 4 days, THEN 1 tablet (5 mg total) 2 (two) times daily for 26 days. 02/23/22 03/25/22  Masters, Katie, DO  B Complex Vitamins (VITAMIN B COMPLEX) TABS Take 1 tablet by mouth at bedtime.    [provider]  betamethasone dipropionate (DIPROLENE) 0.05 % cream Apply 1 application topically 2 (two) times daily as needed (for psoriasis).  10/28/18   [provider]  budesonide-formoterol (SYMBICORT) 160-4.5 MCG/ACT inhaler Inhale 2 puffs into the lungs 2 (two) times daily.     [provider]  calcium-vitamin D (OSCAL WITH D) 500-200 MG-UNIT tablet Take 1 tablet by mouth 2 (two) times daily.    [provider]  guaiFENesin (MUCINEX) 600 MG 12 hr tablet Take 600 mg by mouth 2 (two) times daily as needed for cough.    [provider]  lidocaine-prilocaine (EMLA) cream Apply 1 application topically as needed. Patient taking differently: Apply 1 application topically as needed (access port). 09/23/21   Wyatt Portela, MD  linezolid (ZYVOX) 600 MG tablet Take 1 tablet (600 mg total) by mouth every 12 (twelve) hours. 02/23/22   Masters, Katie, DO  midodrine (PROAMATINE) 10 MG tablet Take 1 tablet (10 mg total) by mouth 3 (three) times daily with meals. 02/23/22   Masters, Katie, DO  morphine 20 MG/5ML solution Take 0.6 mLs (2.4 mg total) by mouth every 2 (two) hours as needed for pain. 02/23/22   Masters, Joellen Jersey, DO  prochlorperazine (COMPAZINE) 10 MG tablet Take 1 tablet (10 mg total) by mouth every 6 (six) hours as needed for nausea or vomiting. 11/05/21   Wyatt Portela, MD  senna-docusate (SENNA S) 8.6-50 MG tablet Take 1 tablet by mouth at bedtime as needed for mild constipation. 01/07/22   Wyatt Portela, MD  triamcinolone cream (KENALOG) 0.1 % Apply 1 application topically 2 (two) times daily as needed (for psoriasis). 10/28/18   [provider]  VENTOLIN HFA 108 (90 Base) MCG/ACT inhaler INHALE 1 PUFF BY MOUTH EVERY 4 HOURS AS NEEDED Patient taking differently: Inhale 1 puff into the lungs every 4 (four) hours as needed for wheezing or shortness  of breath. 09/10/21   Croitoru, Mihai, MD      Allergies    Atorvastatin    Review of Systems   Review of Systems  Constitutional:  Negative for chills and fever.  Respiratory:  Negative for cough and shortness of breath.   Cardiovascular:  Negative for chest pain.  Gastrointestinal:  Negative for abdominal pain, nausea and vomiting.  Genitourinary:  Positive for flank pain.  Musculoskeletal:  Negative for back pain.  All other systems reviewed and are negative.  Physical Exam Updated Vital Signs BP 110/77 (BP Location: Right Arm)    Pulse (!) 127   Temp 98 F (36.7 C) (Oral)   Resp (!) 22   Ht 6' (1.829 m)   Wt 76.2 kg   SpO2 100%   BMI 22.78 kg/m  Physical Exam Vitals and nursing note reviewed.  Constitutional:      General: He is not in acute distress.    Appearance: He is not diaphoretic.  HENT:     Head: Normocephalic and atraumatic.     Mouth/Throat:     Pharynx: No oropharyngeal exudate.  Eyes:     General: No scleral icterus.    Conjunctiva/sclera: Conjunctivae normal.  Cardiovascular:     Rate and Rhythm: Normal rate and regular rhythm.     Pulses: Normal pulses.     Heart sounds: Normal heart sounds.  Pulmonary:     Effort: Pulmonary effort is normal. No respiratory distress.     Breath sounds: Normal breath sounds. No wheezing.  Abdominal:     General: Bowel sounds are normal.     Palpations: Abdomen is soft. There is no mass.     Tenderness: There is no abdominal tenderness. There is no guarding or rebound.  Musculoskeletal:        General: Normal range of motion.     Cervical back: Normal range of motion and neck supple.     Comments: Right nephrostomy tube in place.  Site of left nephrostomy tube without erythema or purulent drainage.  No tenderness to palpation noted to the area.  Skin:    General: Skin is warm and dry.  Neurological:     Mental Status: He is alert.  Psychiatric:        Behavior: Behavior normal.    ED Results / Procedures / Treatments   Labs (all labs ordered are listed, but only abnormal results are displayed) Labs Reviewed  CULTURE, BLOOD (ROUTINE X 2)  CULTURE, BLOOD (ROUTINE X 2)  COMPREHENSIVE METABOLIC PANEL  LACTIC ACID, PLASMA  LACTIC ACID, PLASMA  CBC WITH DIFFERENTIAL/PLATELET  PROTIME-INR  URINALYSIS, ROUTINE W REFLEX MICROSCOPIC    EKG None  Radiology DG Chest Portable 1 View  Result Date: 04/24/2022 CLINICAL DATA:  Fevers and tachycardia. EXAM: PORTABLE CHEST 1 VIEW COMPARISON:  02/20/2022 FINDINGS: There is a right chest wall port a  catheter with tip at the cavoatrial junction. Heart size and mediastinal contours are unremarkable. Aortic atherosclerotic calcifications. No signs of pleural effusion or edema. No airspace densities. Gaseous distension of the visualized bowel loops within the upper abdomen noted. IMPRESSION: 1. No acute cardiopulmonary abnormalities. 2. Partial visualization of gaseous distension of the visualized bowel loops within the upper abdomen. Electronically Signed   By: Kerby Moors M.D.   On: 04/24/2022 14:31   Procedures Procedures    Medications Ordered in ED Medications  sodium chloride 0.9 % bolus 1,000 mL (has no administration in time range)    ED Course/ Medical Decision  Making/ A&P                           Medical Decision Making Amount and/or Complexity of Data Reviewed Labs: ordered. Radiology: ordered.   Pt presents with concerns for left flank pain.  Patient had bilateral nephrostomy tubes in place however noted the left nephrostomy tube was accidentally pulled out approximately 2 days ago.  Patient was to be evaluated with IR today for replacement however was sent to the emergency department for further evaluation of his symptoms.  Initial vital signs, patient tachycardic and tachypneic, not hypoxic.  Patient typically wears 6 L oxygen via nasal cannula continuously.  On exam patient without acute cardiovascular, respiratory, or abdominal exam findings. Right nephrostomy tube in place.  Site of left nephrostomy tube without erythema or purulent drainage.  No tenderness to palpation noted to the area.  Differential diagnosis includes cellulitis, sepsis, pneumonia.    Additional history obtained:  Additional history obtained from Spouse/Significant Other  Labs:  I ordered, and personally interpreted labs.  The pertinent results include:   Blood cultures with orders results pending at time of signout. Urinalysis, PT/INR, lactic, CMP, CBC ordered with results pending at time of  signout.  Imaging: I ordered imaging studies including CXR I independently visualized and interpreted imaging which showed:  1. No acute cardiopulmonary abnormalities.  2. Partial visualization of gaseous distension of the visualized  bowel loops within the upper abdomen.   I agree with the radiologist interpretation  Medications:  I ordered medication including IVF for symptom management I have reviewed the patients home medicines and have made adjustments as needed  Patient case discussed with Dr. Cyndie Mull at sign-out. Plan at sign-out is pending lab and imaging workup, antibiotics to be initiated if indicated by labs and exam, likely admission to the hospital. However, plans may change per accepting team findings. Patient care transferred at sign out.    This chart was dictated using voice recognition software, Dragon. Despite the best efforts of this provider to proofread and correct errors, errors may still occur which can change documentation meaning.   Final Clinical Impression(s) / ED Diagnoses Final diagnoses:  Flank pain  Visit for wound check    Rx / DC Orders ED Discharge Orders     None         Celest Reitz A, PA-C 04/24/22 1514    Luna Fuse, MD 05/15/22 2145

## 2022-04-24 NOTE — ED Triage Notes (Signed)
Patient is hospice patient and has nephrostomy tubes and had left nephrostomy tube pull out last night.  Patient was brought to IR for placement but IR sent him to ER for septic work up.  ?

## 2022-04-24 NOTE — ED Notes (Signed)
ED Provider at bedside. 

## 2022-04-24 NOTE — ED Notes (Signed)
EMS present for patient transport home. 20G IV to right wrist removed with catheter intact, no bleeding noted at site. 277m dark yellow urine emptied from right nephrostomy bag prior to DC. Patient wife and daughter leave at same time. Patient is alert and at his baseline at time of departure.  ?

## 2022-04-24 NOTE — Progress Notes (Addendum)
IR procedure request - bilateral nephrostomy tube exchange. ? ?76 y.o. male outpatient. 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 IR placed bilateral nephrostomy tubes on 1.16.23. Last exchanged on 2.24.23.  ? ?Patient presents as outpatient to IR holding room for bilateral nephrostomy tube exchange. Upon physical exam the left nephrostomy tube is no longer present. Patient states that the nephrostomy tube fell out on Saturday. Exit site is healed over with no leakage noted.  ? ?Since the left sided nephrostomy would be a new placement the Patient will be sent to the ED for further workup and evaluation to determine if a replacement is indicated at this time. This was discussed directly with the Patient who is in agreement with the plan. Left voicemail message for his wife to call back to update her. Should Patient need a new nephrostomy is indicated procedure could be performed on Monday may 1st.  ? ?Patient would need to be: ? Keep Patient to be NPO after midnight on May 1st ?Eliquis would need to be held for 2 days prior to procedure. ?Please call IR with any questions or concerns. ? ? ?

## 2022-04-24 NOTE — Progress Notes (Signed)
Manufacturing engineer [facility]) Hospital Liaison Note ? ?This is a current [facility] hospice patient. We will follow for coordination of continuation of hospice at discharge. Please call with any questions or concerns. Thank you ? ?Roselee Nova, LCSW ?[facility] Liaison  ?[phone] ?

## 2022-04-24 NOTE — ED Provider Notes (Signed)
Transfer of Care Note ?I assumed care of Jahmeir Geisen on 04/24/2022 at '@NOWNR'$ @. ? ?Briefly, Assad Harbeson is a 76 y.o. male who: ?- In hospice, Hx of bilateral neph tube but L side out today/ accidental  ?- Concern for sepsis, Tachy on vitals  ?- Negative abd exam  ? ?  ?DG Chest Portable 1 View  ?Final Result  ?  ? ?The plan includes: ?- f/u with labs, consider admission for possible placement of Nephrostomy tube  ? ?Please refer to the original provider?s note for additional information regarding the care of [employer]. ? ?### ?Reassessment: ?I personally reassessed the patient: Exam unchanged.  Patient continued to be tachycardic but less tachypneic. ? ?Vital Signs:  ?Vitals:  ? 04/24/22 1402  ?BP: 110/77  ?Pulse: (!) 127  ?Resp: (!) 22  ?Temp: 98 ?F (36.7 ?C)  ?SpO2: 100%  ? ? ? ?Additional MDM: ?-Patient seen without leukocytosis.  Hemoglobin of 7.7 and hematocrit of 23.2.  This is close to his baseline.  CMP with mild hyponatremia of 129 and hyperkalemia at 5.3.  As compared to his previous labs per chart review this is new.  Furthermore his albumin is low at 2.5.  His urinalysis did show a large amount of leukocytes with elevated urine WBC.  His initial lactic acid was 2 with downtrending to 1.7.  His blood cultures are pending.  ?-I have spoke with the interventional radiologist about patient's labs and possibility of intervention regarding putting back his left nephrostomy tube.  Interventional radiology did recommend obtaining repeat renal ultrasound to assess for possible hydronephrosis. ?-Spoke with patient wife and his daughter about the possibility of getting renal ultrasound.  At this point they informed me they want to defer.  They report patient is hospice care and they do not want any further intervention other than getting antibiotic if there is a possibility of urinary infection. ?-I have reviewed patient urine culture and previous culture sensitivity and  discussed after antibiotic selection with the pharmacist.  We will treat patient with Augmentin for 14 days.  Per his wife, they do have multiple pain medication at home and will need further pain medication at this time. ?-I have discussed strict return precaution.  I have also recommended follow-up with your nephrology, primary care provider and interventional radiology. ? ?Dispo: D/C  ?  ?Donnamarie Poag, MD ?04/24/22 1855 ? ?  ?Tegeler, Gwenyth Allegra, MD ?04/27/22 0028 ? ?

## 2022-04-24 NOTE — Discharge Instructions (Addendum)
AskedYou have been evaluated for flank pain after removal of the nephrostomy tube. ? ?We have discussed your case with the interventional radiology who recommended getting an ultrasound.  However per your request we have deferred this.  We will give you antibiotic for possible infection.  Please follow-up with your nephrology team and your interventional radiology team.  In addition please follow-up with your primary care provider in 1 to 2 days. ?

## 2022-04-29 LAB — CULTURE, BLOOD (ROUTINE X 2)
Culture: NO GROWTH
Culture: NO GROWTH
Special Requests: ADEQUATE
Special Requests: ADEQUATE

## 2022-05-28 DEATH — deceased

## 2022-06-25 IMAGING — US IR ANGIO/PULMON/BI
1 series · 1 of 1 positions shown · non-contrast
Comparison: 02/17/2022

INDICATION: Acute sub massive bilateral central pulmonary emboli with evidence
of right heart strain by echo, troponin leak, and oxygen
requirement.

EXAM:
ULTRASOUND GUIDANCE FOR VASCULAR ACCESS
BILATERAL PULMONARY ARTERY CATHETERIZATIONS AND ANGIOGRAMS
BILATERAL PULMONARY EMBOLI SUCTION THROMBECTOMY WITH THE PENUMBRA 16
FRENCH DEVICE
TECHNIQUE: Informed written consent was obtained from the patient after a
thorough discussion of the procedural risks, benefits and
alternatives. All questions were addressed. Maximal Sterile Barrier
Technique was utilized including caps, mask, sterile gowns, sterile
gloves, sterile drape, hand hygiene and skin antiseptic. A timeout
was performed prior to the initiation of the procedure.

[Series 1: ir angio/pulmon/bi · 1 of 1 slices shown]
[im 1/1]
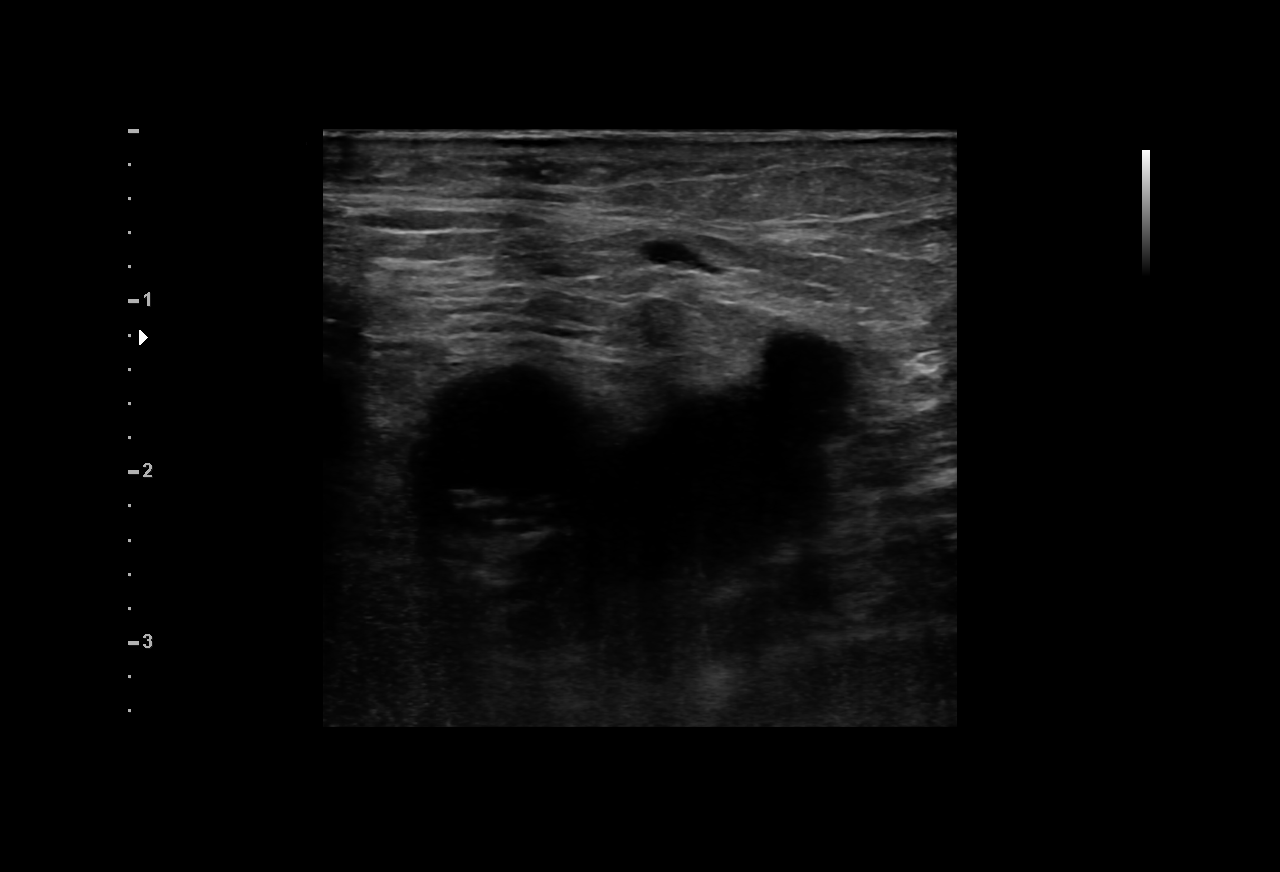

[1 of 1 positions shown; findings below may reference images not displayed]

MEDICATIONS:
1% lidocaine local, 6777 units heparin, 250 mg normal saline bolus

ANESTHESIA/SEDATION:
Moderate (conscious) sedation was employed during this procedure. A
total of Versed 2.5 mg and Fentanyl 75 mcg was administered
intravenously by the radiology nurse.

Total intra-service moderate Sedation Time: 101 minutes. The
patient's level of consciousness and vital signs were monitored
continuously by radiology nursing throughout the procedure under my
direct supervision.

FLUOROSCOPY:
Radiation Exposure Index (as provided by the fluoroscopic device):
185 mGy Kerma

COMPLICATIONS:
None immediate.
Previous imaging reviewed.

Under sterile conditions and local anesthesia, ultrasound
micropuncture needle access performed of the patent right common
femoral vein. Images obtained for documentation of the patent right
common femoral vein.

Transitional dilator advanced followed by guidewire exchanged for a
Amplatz guidewire. Transitional dilatation performed to insert the
16 Ena Narcisse dry seal sheath (33 cm length). This was position in
the suprarenal IVC.

Angled pulmonary artery catheter was utilized to navigate through
the right heart initially into the left pulmonary artery.

Left pulmonary angiogram performed. Bulky central nearly occlusive
branching filling defects present in the left pulmonary artery
vasculature.

Initial central pulmonary artery pressure measurement quickly
obtained: 32/8 (16).

Amplatze guidewire advanced into the left lower lobe pulmonary
artery. The 16 French penumbra thrombectomy device was advanced
easily over the Amplatz guidewire into the hilar pulmonary artery.
Position confirmed with fluoroscopy. Images obtained for
documentation. Suction thrombectomy performed yielding a moderate
volume of fresh clot fragments. At one point, the penumbra 16 French
catheter was removed while under suction for removal of additional
clot.

Pulmonary artery catheter had to be readvanced and navigated through
the right heart again to access the right pulmonary artery
vasculature.

Right pulmonary angiogram performed. Similar central hilar branching
filling defects present into the right lower lobe pulmonary
arteries. This correlates with the CTA. Amplatz guidewire inserted.
Penumbra device tracted easily over the Amplatz guidewire into the
right hilar pulmonary vasculature. Suction thrombectomy performed
with micro advancements and retractions to remove thrombus. At 1
point, the suction catheter was occluded with thrombus and had to be
removed over a guidewire to completely remove additional clot.

Following clot removal, repeat right central pulmonary angiogram
demonstrates significant reduction in thrombus burden with improved
right pulmonary artery flow.

Repeat left pulmonary angiogram also performed by manipulating the
penumbra catheter back to the left hilar branches. This confirms
residual filling defect/thrombus. Additional suction thrombectomy
performed to remove more acute clot from the central left pulmonary
vasculature. Repeat injection of the left pulmonary arteries
confirms improvement in the clot burden and vascular flow.

At this point, post thrombectomy pressure measurements obtained:
[DATE] (7).

Access removed. Hemostasis obtained with a pursestring suture at the
right common femoral access site.

ACT monitor during the procedure with a goal of 250 which required
6777 units of IV heparin during the case. Completion ACT sheath
removal was 200.
FINDINGS: Imaging confirms improvement in the overall bilateral pulmonary
artery clot burden following suction thrombectomy.
IMPRESSION: Successful bilateral acute pulmonary emboli suction thrombectomy
with the 16 French penumbra device.
